# Patient Record
Sex: Female | Born: 1950 | Race: White | Hispanic: No | Marital: Married | Smoking: Former smoker
Health system: Southern US, Community
[De-identification: ages and names within clinical notes are randomized; demographics above are authoritative.]

## PROBLEM LIST (undated history)

## (undated) DIAGNOSIS — F329 Major depressive disorder, single episode, unspecified: Secondary | ICD-10-CM

## (undated) DIAGNOSIS — F419 Anxiety disorder, unspecified: Secondary | ICD-10-CM

## (undated) DIAGNOSIS — R112 Nausea with vomiting, unspecified: Secondary | ICD-10-CM

## (undated) DIAGNOSIS — K449 Diaphragmatic hernia without obstruction or gangrene: Secondary | ICD-10-CM

## (undated) DIAGNOSIS — T4145XA Adverse effect of unspecified anesthetic, initial encounter: Secondary | ICD-10-CM

## (undated) DIAGNOSIS — F32A Depression, unspecified: Secondary | ICD-10-CM

## (undated) DIAGNOSIS — K219 Gastro-esophageal reflux disease without esophagitis: Secondary | ICD-10-CM

## (undated) DIAGNOSIS — N189 Chronic kidney disease, unspecified: Secondary | ICD-10-CM

## (undated) DIAGNOSIS — Z9889 Other specified postprocedural states: Secondary | ICD-10-CM

## (undated) DIAGNOSIS — R569 Unspecified convulsions: Secondary | ICD-10-CM

## (undated) DIAGNOSIS — T8859XA Other complications of anesthesia, initial encounter: Secondary | ICD-10-CM

## (undated) DIAGNOSIS — M1990 Unspecified osteoarthritis, unspecified site: Secondary | ICD-10-CM

## (undated) DIAGNOSIS — I10 Essential (primary) hypertension: Secondary | ICD-10-CM

## (undated) HISTORY — PX: OTHER SURGICAL HISTORY: SHX169

## (undated) HISTORY — PX: BREAST SURGERY: SHX581

## (undated) HISTORY — PX: TUBAL LIGATION: SHX77

## (undated) HISTORY — PX: TONSILLECTOMY: SUR1361

## (undated) HISTORY — PX: ABDOMINAL HYSTERECTOMY: SHX81

## (undated) HISTORY — PX: CHOLECYSTECTOMY: SHX55

---

## 2001-01-25 ENCOUNTER — Ambulatory Visit (HOSPITAL_COMMUNITY): Admission: RE | Admit: 2001-01-25 | Discharge: 2001-01-25 | Payer: Self-pay | Admitting: Internal Medicine

## 2005-05-30 ENCOUNTER — Other Ambulatory Visit: Admission: RE | Admit: 2005-05-30 | Discharge: 2005-05-30 | Payer: Self-pay | Admitting: Obstetrics and Gynecology

## 2006-04-12 ENCOUNTER — Emergency Department (HOSPITAL_COMMUNITY): Admission: EM | Admit: 2006-04-12 | Discharge: 2006-04-12 | Payer: Self-pay | Admitting: Emergency Medicine

## 2007-08-22 ENCOUNTER — Ambulatory Visit (HOSPITAL_COMMUNITY): Admission: RE | Admit: 2007-08-22 | Discharge: 2007-08-22 | Payer: Self-pay | Admitting: Orthopaedic Surgery

## 2007-09-11 ENCOUNTER — Ambulatory Visit (HOSPITAL_COMMUNITY): Admission: RE | Admit: 2007-09-11 | Discharge: 2007-09-11 | Payer: Self-pay | Admitting: Orthopaedic Surgery

## 2007-09-24 ENCOUNTER — Ambulatory Visit (HOSPITAL_COMMUNITY): Admission: RE | Admit: 2007-09-24 | Discharge: 2007-09-24 | Payer: Self-pay | Admitting: Orthopaedic Surgery

## 2008-02-28 ENCOUNTER — Ambulatory Visit (HOSPITAL_COMMUNITY): Admission: RE | Admit: 2008-02-28 | Discharge: 2008-02-28 | Payer: Self-pay | Admitting: General Surgery

## 2010-11-03 ENCOUNTER — Other Ambulatory Visit (HOSPITAL_COMMUNITY): Payer: Self-pay | Admitting: Internal Medicine

## 2010-11-03 DIAGNOSIS — Z139 Encounter for screening, unspecified: Secondary | ICD-10-CM

## 2010-11-04 ENCOUNTER — Ambulatory Visit (HOSPITAL_COMMUNITY): Payer: Self-pay

## 2010-11-05 ENCOUNTER — Ambulatory Visit (HOSPITAL_COMMUNITY)
Admission: RE | Admit: 2010-11-05 | Discharge: 2010-11-05 | Disposition: A | Discharge status: Home / Self Care | Payer: Managed Care, Other (non HMO) | Source: Ambulatory Visit | Attending: Internal Medicine | Admitting: Internal Medicine

## 2010-11-05 DIAGNOSIS — Z1231 Encounter for screening mammogram for malignant neoplasm of breast: Secondary | ICD-10-CM | POA: Insufficient documentation

## 2010-11-05 DIAGNOSIS — Z139 Encounter for screening, unspecified: Secondary | ICD-10-CM

## 2010-11-18 ENCOUNTER — Telehealth: Payer: Self-pay

## 2010-11-18 NOTE — Telephone Encounter (Signed)
Gastroenterology Pre-Procedure Form  Request Date: 11/12/2010,  Requesting Physician: Dr. Dwana Melena     PATIENT INFORMATION:  Kimberly Love is a 60 y.o., female (DOB=February 17, 1951).  PROCEDURE: Procedure(s) requested: colonoscopy Procedure Reason: screening for colon cancer  PATIENT REVIEW QUESTIONS: The patient reports the following:   1. Diabetes Melitis: no 2. Joint replacements in the past 12 months: no 3. Major health problems in the past 3 months: no 4. Has an artificial valve or MVP:no 5. Has been advised in past to take antibiotics in advance of a procedure like teeth cleaning: no}    MEDICATIONS & ALLERGIES:    Patient reports the following regarding taking any blood thinners:   Plavix? no Aspirin?no Coumadin?  no  Patient confirms/reports the following medications:  Current Outpatient Prescriptions  Medication Sig Dispense Refill  . citalopram (CELEXA) 10 MG tablet Take 10 mg by mouth.        Marland Kitchen lisinopril-hydrochlorothiazide (PRINZIDE,ZESTORETIC) 10-12.5 MG per tablet Take 1 tablet by mouth.        Marland Kitchen LORazepam (ATIVAN) 1 MG tablet Take 1 mg by mouth 2 (two) times daily.          Patient confirms/reports the following allergies:  Allergies no known allergies  Patient is appropriate to schedule for requested procedure(s): yes  AUTHORIZATION INFORMATION Primary Insurance: ,  ID #:,  Group #:  Pre-Cert / Auth required:  Pre-Cert / Auth #:   Secondary Insurance: ,  ID #: ,  Group #:  Pre-Cert / Auth required: Pre-Cert / Auth #:   No orders of the defined types were placed in this encounter.    SCHEDULE INFORMATION: Procedure has been scheduled as follows:  Date: , Time:   Location:   This Gastroenterology Pre-Precedure Form is being routed to the following provider(s) for review: R. Arline Asp, MD

## 2010-11-30 ENCOUNTER — Telehealth: Payer: Self-pay

## 2010-11-30 NOTE — Telephone Encounter (Signed)
Called pt to get scheduled for colonoscopy, she said she is not ready at this time. Just had a death in family and she is not dealing with it very well. Said she will call us when she is ready.

## 2010-11-30 NOTE — Telephone Encounter (Signed)
Error

## 2010-12-05 NOTE — Telephone Encounter (Signed)
Ok as is; Reviewed by R. Roetta Sessions, MD [facility]

## 2010-12-05 NOTE — Telephone Encounter (Signed)
Reviewed by R. Michael Keane Martelli, MD FACP FACG 

## 2010-12-08 ENCOUNTER — Other Ambulatory Visit: Payer: Self-pay | Admitting: Internal Medicine

## 2010-12-08 DIAGNOSIS — R928 Other abnormal and inconclusive findings on diagnostic imaging of breast: Secondary | ICD-10-CM

## 2010-12-15 ENCOUNTER — Ambulatory Visit (HOSPITAL_COMMUNITY)
Admission: RE | Admit: 2010-12-15 | Discharge: 2010-12-15 | Disposition: A | Discharge status: Home / Self Care | Payer: Managed Care, Other (non HMO) | Source: Ambulatory Visit | Attending: Internal Medicine | Admitting: Internal Medicine

## 2010-12-15 ENCOUNTER — Other Ambulatory Visit (HOSPITAL_COMMUNITY): Payer: Self-pay | Admitting: Internal Medicine

## 2010-12-15 ENCOUNTER — Other Ambulatory Visit: Payer: Self-pay | Admitting: Radiology

## 2010-12-15 DIAGNOSIS — R928 Other abnormal and inconclusive findings on diagnostic imaging of breast: Secondary | ICD-10-CM

## 2010-12-15 DIAGNOSIS — N63 Unspecified lump in unspecified breast: Secondary | ICD-10-CM | POA: Insufficient documentation

## 2010-12-16 ENCOUNTER — Other Ambulatory Visit: Payer: Self-pay | Admitting: Internal Medicine

## 2010-12-16 DIAGNOSIS — R921 Mammographic calcification found on diagnostic imaging of breast: Secondary | ICD-10-CM

## 2011-01-07 ENCOUNTER — Ambulatory Visit
Admission: RE | Admit: 2011-01-07 | Discharge: 2011-01-07 | Disposition: A | Discharge status: Home / Self Care | Payer: Managed Care, Other (non HMO) | Source: Ambulatory Visit | Attending: Internal Medicine | Admitting: Internal Medicine

## 2011-01-07 ENCOUNTER — Other Ambulatory Visit: Payer: Self-pay | Admitting: Internal Medicine

## 2011-01-07 DIAGNOSIS — R921 Mammographic calcification found on diagnostic imaging of breast: Secondary | ICD-10-CM

## 2011-01-11 NOTE — Op Note (Signed)
Kimberly Love, Kimberly Love           ACCOUNT NO.:  [id number]   MEDICAL RECORD NO.:  [id number]          PATIENT TYPE:  AMB   LOCATION:  DAY                           FACILITY:  [facility]   PHYSICIAN:  J. Darreld Mclean, M.D. DATE OF BIRTH:  07-16-1951   DATE OF PROCEDURE:  09/11/2007  DATE OF DISCHARGE:                               OPERATIVE REPORT   PREOP DIAGNOSIS:  Tear medial meniscus right knee.   POSTOPERATIVE DIAGNOSIS:  Tear medial meniscus right knee.   PROCEDURE:  1. Operative arthroscopy of the right knee.  2. Partial medial meniscectomy.   ANESTHESIA:  General.   SURGEON:  J. Darreld Mclean, M.D.   TOURNIQUET TIME:  29 minutes.   DRAINS:  None.   INDICATIONS:  The patient is a 60 year old female with pain and  tenderness in her right knee giving way.  MRI shows tear of the  posterior horn of the medial meniscus.  This has not improved with  conservative treatment.  Surgery is recommended.  The risks and  imponderables were discussed preoperatively.  The patient asked  appropriate questions, and appears to understand the procedure.   DESCRIPTION OF PROCEDURE:  The patient was seen in the holding area.  The right knee was identified as the correct surgical site.  She placed  a mark on the right knee, and I placed a mark on the right knee.  She  was taken to the operating room and given general anesthesia while  supine.  Leg holder, tourniquet placed, deflated in right upper thigh.  She was prepped and draped in the usual manner.  A time-out identifying  Kimberly Love as the patient, and the right knee as the correct  surgical site.   Appropriately prepped and draped, the leg was elevated, wrapped  circumferentially with an Esmarch bandage..  Tourniquet inflated to 350  mmHg.  Esmarch bandage removed.  Inflow can inserted medially.  Lactated  Ringer's inserted into the knee by an infusion pump.  Arthroscope was  inserted laterally, knee systematically examined.   Suprapatellar pouch  had some mild synovitis; and grade 2 changes of the patella,  particularly medially.  The articular surface of the femur had grade 3  changes as did the tibia with a tear of the posterior horn of the medial  meniscus.  Anterior cruciate was intact laterally with grade 2 changes,  slight fraying of the lateral meniscus but intact.  No loose bodies were  present tissue.   Attention directed to the medial side of the knee, and then using a  meniscal shaver and meniscal punch, a good smooth contour was obtained  for the tear in the posterior horn.  Permanent pictures were taken  throughout the procedure.  The knee was systematically reexamined, and  no new pathology found.   Instruments were removed, and the wound was reapproximated using 3-0  nylon in an interrupted vertical mattress manner.  Marcaine 0.25% was  instilled in each portal.  Tourniquet deflated after 29 minutes.  Sterile dressing applied, bulky dressing applied.  The patient given  prescription for Vicodin ES for pain.  Will be seen in  the office in  approximately 10 days to 2 weeks.  Physical therapy has been arranged.  Any difficulty she is to contact me through the office hospital beeper  system.           ______________________________  Shela Commons. Darreld Mclean, M.D.     JWK/MEDQ  D:  09/11/2007  T:  09/11/2007  Job:  [id number]

## 2011-01-11 NOTE — H&P (Signed)
NAMEHAZELLE, Kimberly Love           ACCOUNT NO.:  [id number]   MEDICAL RECORD NO.:  [id number]          PATIENT TYPE:  AMB   LOCATION:  DAY                           FACILITY:  [facility]   PHYSICIAN:  J. Darreld Mclean, M.D. DATE OF BIRTH:  1951/05/13   DATE OF ADMISSION:  DATE OF DISCHARGE:  LH                              HISTORY & PHYSICAL   CHIEF COMPLAINT:  My knee hurts on the right.   The patient is a 60 year old female with pain and tenderness in her  right knee, began after she fell on Friday December 5.  She had  continued pain, swelling, giving way.  I saw her in the office on  December 16.  I obtained an MRI of the knee on Christmas Eve.  MRI shows  posterior horn medial meniscus tear near the medial meniscus root,  tricompartmental degenerative changes, intact ligamentous changes.  She  had a moderate sized joint effusion.  I informed her the findings on  December 30.  I told her she will most likely need an arthroscopy.   PAST HISTORY:  She had pain and tenderness in right knee on and off in  June 2002.  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  She had taken ibuprofen over the years which helped until the  recent fall.   Past history is negative for heart disease, lung disease, kidney  disease, stroke, paralysis, weakness, hypertension, diabetes,  tuberculosis, rheumatic fever, cancer, polio, ulcer disease or  circulatory problems.   ALLERGIES:  The patient denies any allergies.   MEDICATIONS:  She takes ibuprofen, 4 tablets of Advil 200 mg twice a  day, she takes an occasional Excedrin.   SOCIAL HISTORY:  She smokes a pack a day.  She does not use alcoholic  beverages.   PRIMARY CARE PHYSICIAN:  Dr. Malvin Johns is considered her family doctor.   PAST SURGICAL HISTORY:  She had tubal ligation in 1973, hysterectomy  1985, cholecystectomy 1987.   FAMILY HISTORY:  Hypertension runs in the family.   The patient lives in [place] and is  married.   PHYSICAL EXAMINATION:  VITAL SIGNS:  BP is 120/70, pulse 68,  respirations 16, afebrile, 5 feet 6, 247 pounds.  GENERAL:  She is alert, cooperative, oriented.  HEENT: Negative.  NECK: Supple.  LUNGS: Clear to P&A.  HEART: Regular rate and rhythm without murmur heard.  ABDOMEN: Soft, nontender without masses.  EXTREMITIES:  Right knee is tender.  She has pain over the medial joint  line with a positive McMurray medially.  Knee is stable.  Range of  motion 0 to 110 degrees with pain more medially.  There is a very slight  limp.  CNS: Is intact.  SKIN:  Is intact.   IMPRESSION:  Tear medial meniscus of the right knee, degenerative joint  disease.   PLAN:  Arthroscopy of the knee, partial medial meniscectomy.  I have  explained the risks and imponderables of the procedure to the patient.  She appears to understand and agrees to the procedure as outlined.  She  asked appropriate questions.  Her labs are  pending.                                            ______________________________  J. Darreld Mclean, M.D.     JWK/MEDQ  D:  08/29/2007  T:  08/29/2007  Job:  [id number]

## 2011-01-11 NOTE — H&P (Signed)
Kimberly Love, Kimberly Love           ACCOUNT NO.:  [id number]   MEDICAL RECORD NO.:  [id number]          PATIENT TYPE:  AMB   LOCATION:  DAY                           FACILITY:  [facility]   PHYSICIAN:  J. Darreld Mclean, M.D. DATE OF BIRTH:  [birth year]   DATE OF ADMISSION:  DATE OF DISCHARGE:  LH                              HISTORY & PHYSICAL   CHIEF COMPLAINT:  Right knee pain.   The patient is a 60 year old female who fell on her right knee on Friday  August 03, 2007.  She continued pain and swelling.  I saw her in my  office on August 14, 2007.  I obtained an MRI of the knee.  There was  also concern about a medial meniscal tear.  MRI showed tricompartmental  degenerative joint disease changes, with a posterior horn medial  meniscal tear.  She had a moderate joint effusion.  I informed her of  the findings on August 28, 2007.  She wanted to think about surgery  and talk to her work.  She has talked to them and elects to have surgery  at this time.  I have gone over the risks and imponderables of the  procedure.   PAST HISTORY:  Negative.  She has no allergies.  She has been taking  ibuprofen for the knee as well as Vicodin.  I have changed her to  Naprosyn; she is taking that 1 twice a day.  She smokes a pack of  cigarettes per day.  Does not use alcoholic beverages.   Dr. Malvin Johns is her family doctor.  She is status post tubal ligation in  1973, hysterectomy in 1985, and cholecystectomy in 1987.  Hypertension  runs in the family.  The patient is married, lives in [place].   PHYSICAL EXAMINATION:  GENERAL:  The patient is alert, cooperative, and  oriented.  VITAL SIGNS:  BP is 120/70, pulse 68, respirations 16, afebrile, height  5 feet 6 inches, weight 247 pounds.  NECK:  Supple.  LUNGS: Clear to P&A.  HEART:  Regular rhythm. without murmur heard.  ABDOMEN:  Soft, nontender, without masses.  EXTREMITIES:  Right knee is tender, with positive Murray medially.  She  has a  mild effusion.  Other extremities negative.  CNS: Intact.  SKIN:  Intact.   IMPRESSION:  Tear of medial meniscus right knee.   PLAN:  Operative arthroscopy of the right knee.  Risks and imponderables  have been discussed with the patient preoperatively.  She appears to  understand.  Labs are pending.                                            ______________________________  J. Darreld Mclean, M.D.     JWK/MEDQ  D:  09/10/2007  T:  09/10/2007  Job:  [id number]

## 2011-02-18 ENCOUNTER — Inpatient Hospital Stay: Admission: RE | Admit: 2011-02-18 | Payer: Managed Care, Other (non HMO) | Source: Ambulatory Visit

## 2011-05-19 LAB — COMPREHENSIVE METABOLIC PANEL
AST: 17
Albumin: 4.1
Alkaline Phosphatase: 63
BUN: 13
Calcium: 9.2
Chloride: 103
Creatinine, Ser: 0.54
GFR calc non Af Amer: >60
Total Bilirubin: 0.4
Total Protein: 6.6

## 2011-05-19 LAB — URINALYSIS, ROUTINE W REFLEX MICROSCOPIC
Ketones, ur: NEGATIVE
Urobilinogen, UA: 0.2

## 2011-05-19 LAB — CBC
Hemoglobin: 13.6
MCHC: 32.8
RBC: 4.78
WBC: 12.6 — ABNORMAL HIGH

## 2011-05-19 LAB — DIFFERENTIAL
Basophils Relative: 1
Eosinophils Absolute: 0.6
Monocytes Absolute: 0.7
Neutrophils Relative %: 60

## 2011-12-14 ENCOUNTER — Encounter (HOSPITAL_COMMUNITY): Payer: Self-pay | Admitting: Pharmacy Technician

## 2011-12-15 ENCOUNTER — Other Ambulatory Visit: Payer: Self-pay | Admitting: Orthopedic Surgery

## 2011-12-19 ENCOUNTER — Encounter (HOSPITAL_COMMUNITY): Payer: Self-pay

## 2011-12-19 ENCOUNTER — Encounter (HOSPITAL_COMMUNITY)
Admission: RE | Admit: 2011-12-19 | Discharge: 2011-12-19 | Disposition: A | Discharge status: Home / Self Care | Payer: Managed Care, Other (non HMO) | Source: Ambulatory Visit | Attending: Orthopedic Surgery | Admitting: Orthopedic Surgery

## 2011-12-19 HISTORY — DX: Other specified postprocedural states: R11.2

## 2011-12-19 HISTORY — DX: Unspecified convulsions: R56.9

## 2011-12-19 HISTORY — DX: Other complications of anesthesia, initial encounter: T88.59XA

## 2011-12-19 HISTORY — DX: Anxiety disorder, unspecified: F41.9

## 2011-12-19 HISTORY — DX: Depression, unspecified: F32.A

## 2011-12-19 HISTORY — DX: Major depressive disorder, single episode, unspecified: F32.9

## 2011-12-19 HISTORY — DX: Other specified postprocedural states: Z98.890

## 2011-12-19 HISTORY — DX: Adverse effect of unspecified anesthetic, initial encounter: T41.45XA

## 2011-12-19 HISTORY — DX: Essential (primary) hypertension: I10

## 2011-12-19 LAB — COMPREHENSIVE METABOLIC PANEL
ALT: 11 U/L (ref 0–35)
AST: 14 U/L (ref 0–37)
CO2: 25 mEq/L (ref 19–32)
Chloride: 101 mEq/L (ref 96–112)
Creatinine, Ser: 0.54 mg/dL (ref 0.50–1.10)
GFR calc non Af Amer: >90 mL/min (ref >90)
Sodium: 136 mEq/L (ref 135–145)
Total Bilirubin: 0.3 mg/dL (ref 0.3–1.2)

## 2011-12-19 LAB — CBC
Hemoglobin: 14.8 g/dL (ref 12.0–15.0)
MCV: 87.6 fL (ref 78.0–100.0)
Platelets: 300 10*3/uL (ref 150–400)
RBC: 4.92 MIL/uL (ref 3.87–5.11)
WBC: 11.3 10*3/uL — ABNORMAL HIGH (ref 4.0–10.5)

## 2011-12-19 LAB — URINALYSIS, ROUTINE W REFLEX MICROSCOPIC
Glucose, UA: NEGATIVE mg/dL
Hgb urine dipstick: NEGATIVE
Specific Gravity, Urine: 1.012 (ref 1.005–1.030)

## 2011-12-19 LAB — ABO/RH: ABO/RH(D): A POS

## 2011-12-19 LAB — DIFFERENTIAL
Basophils Absolute: 0 10*3/uL (ref 0.0–0.1)
Basophils Relative: 0 % (ref 0–1)
Eosinophils Relative: 3 % (ref 0–5)
Monocytes Absolute: 0.8 10*3/uL (ref 0.1–1.0)

## 2011-12-19 MED ORDER — CHLORHEXIDINE GLUCONATE 4 % EX LIQD: 60.0000 mL | Freq: Once | CUTANEOUS | Status: DC

## 2011-12-19 NOTE — Pre-Procedure Instructions (Signed)
[name]  12/19/2011   Your procedure is scheduled on:  12/26/11  Report to [facility] at 645 AM.  Call this number if you have problems the morning of surgery: [phone]   Remember:   Do not eat food:After Midnight.  May have clear liquids: up to 4 Hours before arrival.  Clear liquids include soda, tea, black coffee, apple or grape juice, broth.  Take these medicines the morning of surgery with A SIP OF WATER: celexa,,ativan   Do not wear jewelry, make-up or nail polish.  Do not wear lotions, powders, or perfumes. You may wear deodorant.  Do not shave 48 hours prior to surgery.  Do not bring valuables to the hospital.  Contacts, dentures or bridgework may not be worn into surgery.  Leave suitcase in the car. After surgery it may be brought to your room.  For patients admitted to the hospital, checkout time is 11:00 AM the day of discharge.   Patients discharged the day of surgery will not be allowed to drive home.  Name and phone number of your driver: family  Special Instructions: CHG Shower Use Special Wash: 1/2 bottle night before surgery and 1/2 bottle morning of surgery.   Please read over the following fact sheets that you were given: Pain Booklet, Coughing and Deep Breathing, Blood Transfusion Information, MRSA Information and Surgical Site Infection Prevention

## 2011-12-20 LAB — URINE CULTURE
Colony Count: NO GROWTH
Culture: NO GROWTH

## 2011-12-20 NOTE — Consult Note (Addendum)
Anesthesia Chart Review:  Patient is a 61 year old female scheduled for a right TKA on 12/26/11.  History includes HTN, single seizure, depression, anxiety, smoking. PCP is Dr. Dwana Melena.  CXR on 12/19/11 showed no acute cardiopulmonary findings.   Labs acceptable.    EKG on 12/19/11 showed SR with first degree AVB, right BBB.  Currently there are no comparison EKGs available in [facility], and Dr. Scharlene Gloss office is closed.  A fax request was sent.  If a comparison EKG is obtained, I've asked the staff to have me evaluate.  However, since no CV symptoms were documented at her recent PAT visit and she has no documented history of CAD/MI/CHF or DM, then anticipate she will be able to proceed.  Addendum: 12/21/11 1330  There are no prior EKGs done at Dr. Scharlene Gloss office.

## 2011-12-25 MED ORDER — CEFAZOLIN SODIUM-DEXTROSE 2-3 GM-% IV SOLR
2.0000 g | INTRAVENOUS | Status: AC
  Administered 2011-12-26: 2 g via INTRAVENOUS
  Filled 2011-12-25: qty 50

## 2011-12-25 MED ORDER — ACETAMINOPHEN 10 MG/ML IV SOLN
1000.0000 mg | Freq: Four times a day (QID) | INTRAVENOUS | Status: DC
  Administered 2011-12-26: 1000 mg via INTRAVENOUS
  Filled 2011-12-25 (×4): qty 100

## 2011-12-25 MED ORDER — SODIUM CHLORIDE 0.9 % IV SOLN: INTRAVENOUS | Status: DC

## 2011-12-26 ENCOUNTER — Inpatient Hospital Stay (HOSPITAL_COMMUNITY)
Admission: RE | Admit: 2011-12-26 | Discharge: 2011-12-30 | DRG: 469 | Disposition: A | Discharge status: Home Health | Payer: Managed Care, Other (non HMO) | Source: Ambulatory Visit | Attending: Orthopedic Surgery | Admitting: Orthopedic Surgery

## 2011-12-26 ENCOUNTER — Encounter (HOSPITAL_COMMUNITY): Payer: Self-pay | Admitting: *Deleted

## 2011-12-26 ENCOUNTER — Ambulatory Visit (HOSPITAL_COMMUNITY): Payer: Managed Care, Other (non HMO) | Admitting: Vascular Surgery

## 2011-12-26 ENCOUNTER — Encounter (HOSPITAL_COMMUNITY)
Admission: RE | Disposition: A | Discharge status: Home Health | Payer: Self-pay | Source: Ambulatory Visit | Attending: Orthopedic Surgery

## 2011-12-26 ENCOUNTER — Encounter (HOSPITAL_COMMUNITY): Payer: Self-pay | Admitting: Vascular Surgery

## 2011-12-26 DIAGNOSIS — F411 Generalized anxiety disorder: Secondary | ICD-10-CM | POA: Diagnosis present

## 2011-12-26 DIAGNOSIS — J9600 Acute respiratory failure, unspecified whether with hypoxia or hypercapnia: Secondary | ICD-10-CM | POA: Diagnosis not present

## 2011-12-26 DIAGNOSIS — F3289 Other specified depressive episodes: Secondary | ICD-10-CM | POA: Diagnosis present

## 2011-12-26 DIAGNOSIS — E872 Acidosis, unspecified: Secondary | ICD-10-CM | POA: Diagnosis not present

## 2011-12-26 DIAGNOSIS — N179 Acute kidney failure, unspecified: Secondary | ICD-10-CM | POA: Diagnosis not present

## 2011-12-26 DIAGNOSIS — I10 Essential (primary) hypertension: Secondary | ICD-10-CM | POA: Diagnosis present

## 2011-12-26 DIAGNOSIS — D62 Acute posthemorrhagic anemia: Secondary | ICD-10-CM | POA: Diagnosis not present

## 2011-12-26 DIAGNOSIS — F329 Major depressive disorder, single episode, unspecified: Secondary | ICD-10-CM | POA: Diagnosis present

## 2011-12-26 DIAGNOSIS — E861 Hypovolemia: Secondary | ICD-10-CM | POA: Diagnosis not present

## 2011-12-26 DIAGNOSIS — Z9089 Acquired absence of other organs: Secondary | ICD-10-CM

## 2011-12-26 DIAGNOSIS — Z90710 Acquired absence of both cervix and uterus: Secondary | ICD-10-CM

## 2011-12-26 DIAGNOSIS — G9341 Metabolic encephalopathy: Secondary | ICD-10-CM | POA: Diagnosis not present

## 2011-12-26 DIAGNOSIS — R0902 Hypoxemia: Secondary | ICD-10-CM | POA: Diagnosis not present

## 2011-12-26 DIAGNOSIS — D72829 Elevated white blood cell count, unspecified: Secondary | ICD-10-CM | POA: Diagnosis not present

## 2011-12-26 DIAGNOSIS — F17200 Nicotine dependence, unspecified, uncomplicated: Secondary | ICD-10-CM | POA: Diagnosis present

## 2011-12-26 DIAGNOSIS — M1711 Unilateral primary osteoarthritis, right knee: Secondary | ICD-10-CM

## 2011-12-26 DIAGNOSIS — E8729 Other acidosis: Secondary | ICD-10-CM | POA: Diagnosis not present

## 2011-12-26 DIAGNOSIS — J449 Chronic obstructive pulmonary disease, unspecified: Secondary | ICD-10-CM

## 2011-12-26 DIAGNOSIS — R4182 Altered mental status, unspecified: Secondary | ICD-10-CM

## 2011-12-26 DIAGNOSIS — M1710 Unilateral primary osteoarthritis, unspecified knee: Principal | ICD-10-CM | POA: Diagnosis present

## 2011-12-26 HISTORY — PX: TOTAL KNEE ARTHROPLASTY: SHX125

## 2011-12-26 SURGERY — ARTHROPLASTY, KNEE, TOTAL
Anesthesia: General | Site: Knee | Laterality: Right | Wound class: Clean

## 2011-12-26 MED ORDER — LISINOPRIL-HYDROCHLOROTHIAZIDE 20-25 MG PO TABS: 1.0000 | ORAL_TABLET | Freq: Every day | ORAL | Status: DC

## 2011-12-26 MED ORDER — METHOCARBAMOL 100 MG/ML IJ SOLN
500.0000 mg | Freq: Four times a day (QID) | INTRAMUSCULAR | Status: DC | PRN
  Administered 2011-12-26: 500 mg via INTRAVENOUS
  Filled 2011-12-26: qty 5

## 2011-12-26 MED ORDER — HYDROCHLOROTHIAZIDE 25 MG PO TABS
25.0000 mg | ORAL_TABLET | Freq: Every day | ORAL | Status: DC
  Administered 2011-12-27: 25 mg via ORAL
  Filled 2011-12-26 (×3): qty 1

## 2011-12-26 MED ORDER — LISINOPRIL 20 MG PO TABS
20.0000 mg | ORAL_TABLET | Freq: Every day | ORAL | Status: DC
  Administered 2011-12-27: 20 mg via ORAL
  Filled 2011-12-26 (×3): qty 1

## 2011-12-26 MED ORDER — SODIUM CHLORIDE 0.9 % IV SOLN
INTRAVENOUS | Status: DC
  Administered 2011-12-26 – 2011-12-27 (×2): via INTRAVENOUS

## 2011-12-26 MED ORDER — METHOCARBAMOL 500 MG PO TABS
500.0000 mg | ORAL_TABLET | Freq: Four times a day (QID) | ORAL | Status: DC | PRN
  Administered 2011-12-26: 500 mg via ORAL
  Filled 2011-12-26 (×2): qty 1

## 2011-12-26 MED ORDER — MENTHOL 3 MG MT LOZG: 1.0000 | LOZENGE | OROMUCOSAL | Status: DC | PRN

## 2011-12-26 MED ORDER — METOCLOPRAMIDE HCL 10 MG PO TABS: 5.0000 mg | ORAL_TABLET | Freq: Three times a day (TID) | ORAL | Status: DC | PRN

## 2011-12-26 MED ORDER — ACETAMINOPHEN 650 MG RE SUPP: 650.0000 mg | Freq: Four times a day (QID) | RECTAL | Status: DC | PRN

## 2011-12-26 MED ORDER — LACTATED RINGERS IV SOLN
INTRAVENOUS | Status: DC | PRN
  Administered 2011-12-26 (×2): via INTRAVENOUS

## 2011-12-26 MED ORDER — OXYCODONE HCL 5 MG PO TABS
5.0000 mg | ORAL_TABLET | ORAL | Status: DC | PRN
  Administered 2011-12-26: 10 mg via ORAL
  Administered 2011-12-26: 5 mg via ORAL
  Administered 2011-12-26 – 2011-12-27 (×2): 10 mg via ORAL
  Filled 2011-12-26 (×3): qty 2

## 2011-12-26 MED ORDER — LORAZEPAM 2 MG/ML IJ SOLN
1.0000 mg | Freq: Once | INTRAMUSCULAR | Status: AC | PRN
  Administered 2011-12-26: 0.5 mg via INTRAVENOUS

## 2011-12-26 MED ORDER — SENNOSIDES-DOCUSATE SODIUM 8.6-50 MG PO TABS
1.0000 | ORAL_TABLET | Freq: Every evening | ORAL | Status: DC | PRN
  Filled 2011-12-26: qty 1

## 2011-12-26 MED ORDER — MIDAZOLAM HCL 2 MG/2ML IJ SOLN
1.0000 mg | INTRAMUSCULAR | Status: DC | PRN
  Administered 2011-12-26: 2 mg via INTRAVENOUS

## 2011-12-26 MED ORDER — ONDANSETRON HCL 4 MG/2ML IJ SOLN
INTRAMUSCULAR | Status: DC | PRN
  Administered 2011-12-26 (×2): 4 mg via INTRAVENOUS

## 2011-12-26 MED ORDER — DOCUSATE SODIUM 100 MG PO CAPS
100.0000 mg | ORAL_CAPSULE | Freq: Two times a day (BID) | ORAL | Status: DC
  Administered 2011-12-26 – 2011-12-30 (×7): 100 mg via ORAL
  Filled 2011-12-26 (×9): qty 1

## 2011-12-26 MED ORDER — METOCLOPRAMIDE HCL 5 MG/ML IJ SOLN
5.0000 mg | Freq: Three times a day (TID) | INTRAMUSCULAR | Status: DC | PRN
  Filled 2011-12-26: qty 2

## 2011-12-26 MED ORDER — SCOPOLAMINE 1 MG/3DAYS TD PT72
MEDICATED_PATCH | TRANSDERMAL | Status: DC | PRN
  Administered 2011-12-26: 1 via TRANSDERMAL

## 2011-12-26 MED ORDER — ACETAMINOPHEN 325 MG PO TABS
650.0000 mg | ORAL_TABLET | Freq: Four times a day (QID) | ORAL | Status: DC | PRN
  Administered 2011-12-29: 650 mg via ORAL
  Filled 2011-12-26: qty 2

## 2011-12-26 MED ORDER — FENTANYL CITRATE 0.05 MG/ML IJ SOLN
50.0000 ug | INTRAMUSCULAR | Status: DC | PRN
  Administered 2011-12-26: 100 ug via INTRAVENOUS

## 2011-12-26 MED ORDER — BUPIVACAINE 0.25 % ON-Q PUMP SINGLE CATH 300ML
300.0000 mL | INJECTION | Status: DC
  Filled 2011-12-26: qty 300

## 2011-12-26 MED ORDER — BUPIVACAINE-EPINEPHRINE PF 0.5-1:200000 % IJ SOLN
INTRAMUSCULAR | Status: DC | PRN
  Administered 2011-12-26: 25 mL

## 2011-12-26 MED ORDER — ROCURONIUM BROMIDE 100 MG/10ML IV SOLN
INTRAVENOUS | Status: DC | PRN
  Administered 2011-12-26: 50 mg via INTRAVENOUS

## 2011-12-26 MED ORDER — ZOLPIDEM TARTRATE 5 MG PO TABS: 5.0000 mg | ORAL_TABLET | Freq: Every evening | ORAL | Status: DC | PRN

## 2011-12-26 MED ORDER — FENTANYL CITRATE 0.05 MG/ML IJ SOLN
INTRAMUSCULAR | Status: DC | PRN
Start: 2011-12-26 — End: 2011-12-26
  Administered 2011-12-26: 100 ug via INTRAVENOUS
  Administered 2011-12-26 (×3): 50 ug via INTRAVENOUS

## 2011-12-26 MED ORDER — ENOXAPARIN SODIUM 30 MG/0.3ML ~~LOC~~ SOLN
30.0000 mg | Freq: Two times a day (BID) | SUBCUTANEOUS | Status: DC
  Administered 2011-12-27 – 2011-12-30 (×6): 30 mg via SUBCUTANEOUS
  Filled 2011-12-26 (×10): qty 0.3

## 2011-12-26 MED ORDER — LORAZEPAM 1 MG PO TABS
1.0000 mg | ORAL_TABLET | Freq: Every day | ORAL | Status: DC
  Administered 2011-12-26: 1 mg via ORAL
  Filled 2011-12-26: qty 1

## 2011-12-26 MED ORDER — MIDAZOLAM HCL 2 MG/2ML IJ SOLN
INTRAMUSCULAR | Status: AC
  Filled 2011-12-26: qty 2

## 2011-12-26 MED ORDER — BISACODYL 5 MG PO TBEC: 5.0000 mg | DELAYED_RELEASE_TABLET | Freq: Every day | ORAL | Status: DC | PRN

## 2011-12-26 MED ORDER — HYDROMORPHONE HCL PF 1 MG/ML IJ SOLN
0.5000 mg | INTRAMUSCULAR | Status: DC | PRN
  Administered 2011-12-26 – 2011-12-27 (×2): 1 mg via INTRAVENOUS
  Filled 2011-12-26 (×2): qty 1

## 2011-12-26 MED ORDER — CITALOPRAM HYDROBROMIDE 20 MG PO TABS
20.0000 mg | ORAL_TABLET | Freq: Every day | ORAL | Status: DC
  Administered 2011-12-26 – 2011-12-30 (×5): 20 mg via ORAL
  Filled 2011-12-26 (×5): qty 1

## 2011-12-26 MED ORDER — BUPIVACAINE 0.25 % ON-Q PUMP SINGLE CATH 300ML
INJECTION | Status: DC | PRN
  Administered 2011-12-26: 300 mL

## 2011-12-26 MED ORDER — PHENOL 1.4 % MT LIQD: 1.0000 | OROMUCOSAL | Status: DC | PRN

## 2011-12-26 MED ORDER — ONDANSETRON HCL 4 MG/2ML IJ SOLN
4.0000 mg | Freq: Four times a day (QID) | INTRAMUSCULAR | Status: DC | PRN
  Administered 2011-12-27: 4 mg via INTRAVENOUS
  Filled 2011-12-26: qty 2

## 2011-12-26 MED ORDER — OXYCODONE HCL 5 MG PO TABS
ORAL_TABLET | ORAL | Status: AC
  Filled 2011-12-26: qty 2

## 2011-12-26 MED ORDER — FLEET ENEMA 7-19 GM/118ML RE ENEM: 1.0000 | ENEMA | Freq: Once | RECTAL | Status: AC | PRN

## 2011-12-26 MED ORDER — LORAZEPAM 2 MG/ML IJ SOLN
INTRAMUSCULAR | Status: AC
  Filled 2011-12-26: qty 1

## 2011-12-26 MED ORDER — MIDAZOLAM HCL 5 MG/5ML IJ SOLN
INTRAMUSCULAR | Status: DC | PRN
  Administered 2011-12-26: 1 mg via INTRAVENOUS

## 2011-12-26 MED ORDER — PROPOFOL 10 MG/ML IV EMUL
INTRAVENOUS | Status: DC | PRN
  Administered 2011-12-26: 200 mg via INTRAVENOUS

## 2011-12-26 MED ORDER — ACETAMINOPHEN 10 MG/ML IV SOLN
INTRAVENOUS | Status: AC
  Filled 2011-12-26: qty 100

## 2011-12-26 MED ORDER — HYDROMORPHONE HCL PF 1 MG/ML IJ SOLN
0.2500 mg | INTRAMUSCULAR | Status: DC | PRN
  Administered 2011-12-26 (×2): 0.5 mg via INTRAVENOUS

## 2011-12-26 MED ORDER — ONDANSETRON HCL 4 MG PO TABS: 4.0000 mg | ORAL_TABLET | Freq: Four times a day (QID) | ORAL | Status: DC | PRN

## 2011-12-26 MED ORDER — BUPIVACAINE ON-Q PAIN PUMP (FOR ORDER SET NO CHG)
INJECTION | Status: AC
  Filled 2011-12-26: qty 1

## 2011-12-26 MED ORDER — DIPHENHYDRAMINE HCL 12.5 MG/5ML PO ELIX
12.5000 mg | ORAL_SOLUTION | ORAL | Status: DC | PRN
  Filled 2011-12-26: qty 10

## 2011-12-26 MED ORDER — LACTATED RINGERS IV SOLN
INTRAVENOUS | Status: DC
  Administered 2011-12-26: 09:00:00 via INTRAVENOUS

## 2011-12-26 MED ORDER — CEFAZOLIN SODIUM-DEXTROSE 2-3 GM-% IV SOLR
2.0000 g | Freq: Four times a day (QID) | INTRAVENOUS | Status: AC
  Administered 2011-12-26 – 2011-12-27 (×3): 2 g via INTRAVENOUS
  Filled 2011-12-26 (×4): qty 50

## 2011-12-26 MED ORDER — BUPIVACAINE-EPINEPHRINE 0.25% -1:200000 IJ SOLN
INTRAMUSCULAR | Status: DC | PRN
  Administered 2011-12-26: 20 mL

## 2011-12-26 MED ORDER — NEOSTIGMINE METHYLSULFATE 1 MG/ML IJ SOLN
INTRAMUSCULAR | Status: DC | PRN
  Administered 2011-12-26: 3 mg via INTRAVENOUS

## 2011-12-26 MED ORDER — FENTANYL CITRATE 0.05 MG/ML IJ SOLN
INTRAMUSCULAR | Status: AC
  Filled 2011-12-26: qty 2

## 2011-12-26 MED ORDER — SODIUM CHLORIDE 0.9 % IR SOLN
Status: DC | PRN
  Administered 2011-12-26: 1000 mL

## 2011-12-26 MED ORDER — OXYCODONE HCL 10 MG PO TB12
10.0000 mg | ORAL_TABLET | Freq: Two times a day (BID) | ORAL | Status: DC
  Administered 2011-12-26: 10 mg via ORAL
  Filled 2011-12-26: qty 1

## 2011-12-26 MED ORDER — LIDOCAINE HCL (CARDIAC) 20 MG/ML IV SOLN
INTRAVENOUS | Status: DC | PRN
  Administered 2011-12-26: 100 mg via INTRAVENOUS

## 2011-12-26 MED ORDER — CELECOXIB 200 MG PO CAPS
200.0000 mg | ORAL_CAPSULE | Freq: Two times a day (BID) | ORAL | Status: DC
  Administered 2011-12-26 – 2011-12-30 (×7): 200 mg via ORAL
  Filled 2011-12-26 (×9): qty 1

## 2011-12-26 MED ORDER — SCOPOLAMINE 1 MG/3DAYS TD PT72
1.0000 | MEDICATED_PATCH | Freq: Once | TRANSDERMAL | Status: DC
  Filled 2011-12-26: qty 1

## 2011-12-26 MED ORDER — GLYCOPYRROLATE 0.2 MG/ML IJ SOLN
INTRAMUSCULAR | Status: DC | PRN
  Administered 2011-12-26: 0.6 mg via INTRAVENOUS

## 2011-12-26 MED ORDER — ALUM & MAG HYDROXIDE-SIMETH 200-200-20 MG/5ML PO SUSP
30.0000 mL | ORAL | Status: DC | PRN
  Filled 2011-12-26: qty 30

## 2011-12-26 SURGICAL SUPPLY — 58 items
BANDAGE ESMARK 6X9 LF (GAUZE/BANDAGES/DRESSINGS) ×1 IMPLANT
BLADE SAGITTAL 13X1.27X60 (BLADE) ×2 IMPLANT
BLADE SAW SGTL 83.5X18.5 (BLADE) ×2 IMPLANT
BNDG ESMARK 6X9 LF (GAUZE/BANDAGES/DRESSINGS) ×2
BOWL SMART MIX CTS (DISPOSABLE) ×2 IMPLANT
CATH KIT ON Q 5IN SLV (PAIN MANAGEMENT) ×2 IMPLANT
CEMENT BONE SIMPLEX SPEEDSET (Cement) ×4 IMPLANT
CLOTH BEACON ORANGE TIMEOUT ST (SAFETY) ×2 IMPLANT
COVER BACK TABLE 24X17X13 BIG (DRAPES) ×2 IMPLANT
COVER SURGICAL LIGHT HANDLE (MISCELLANEOUS) ×2 IMPLANT
CUFF TOURNIQUET SINGLE 34IN LL (TOURNIQUET CUFF) IMPLANT
CUFF TOURNIQUET SINGLE 44IN (TOURNIQUET CUFF) ×2 IMPLANT
DRAPE EXTREMITY T 121X128X90 (DRAPE) ×2 IMPLANT
DRAPE INCISE IOBAN 66X45 STRL (DRAPES) ×4 IMPLANT
DRAPE PROXIMA HALF (DRAPES) ×2 IMPLANT
DRAPE U-SHAPE 47X51 STRL (DRAPES) ×2 IMPLANT
DRSG ADAPTIC 3X8 NADH LF (GAUZE/BANDAGES/DRESSINGS) ×2 IMPLANT
DRSG PAD ABDOMINAL 8X10 ST (GAUZE/BANDAGES/DRESSINGS) ×2 IMPLANT
DURAPREP 26ML APPLICATOR (WOUND CARE) ×2 IMPLANT
ELECT REM PT RETURN 9FT ADLT (ELECTROSURGICAL) ×2
ELECTRODE REM PT RTRN 9FT ADLT (ELECTROSURGICAL) ×1 IMPLANT
EVACUATOR 1/8 PVC DRAIN (DRAIN) ×2 IMPLANT
GLOVE BIOGEL M 7.0 STRL (GLOVE) ×2 IMPLANT
GLOVE BIOGEL PI IND STRL 7.5 (GLOVE) ×1 IMPLANT
GLOVE BIOGEL PI IND STRL 8.5 (GLOVE) ×2 IMPLANT
GLOVE BIOGEL PI INDICATOR 7.5 (GLOVE) ×1
GLOVE BIOGEL PI INDICATOR 8.5 (GLOVE) ×2
GLOVE SURG ORTHO 8.0 STRL STRW (GLOVE) ×4 IMPLANT
GOWN PREVENTION PLUS XLARGE (GOWN DISPOSABLE) ×4 IMPLANT
GOWN STRL NON-REIN LRG LVL3 (GOWN DISPOSABLE) ×4 IMPLANT
HANDPIECE INTERPULSE COAX TIP (DISPOSABLE) ×1
HOOD PEEL AWAY FACE SHEILD DIS (HOOD) ×8 IMPLANT
KIT BASIN OR (CUSTOM PROCEDURE TRAY) ×2 IMPLANT
KIT ROOM TURNOVER OR (KITS) ×2 IMPLANT
MANIFOLD NEPTUNE II (INSTRUMENTS) ×2 IMPLANT
NEEDLE 22X1 1/2 (OR ONLY) (NEEDLE) IMPLANT
NS IRRIG 1000ML POUR BTL (IV SOLUTION) ×2 IMPLANT
PACK TOTAL JOINT (CUSTOM PROCEDURE TRAY) ×2 IMPLANT
PAD ARMBOARD 7.5X6 YLW CONV (MISCELLANEOUS) ×4 IMPLANT
PADDING CAST COTTON 6X4 STRL (CAST SUPPLIES) ×2 IMPLANT
POSITIONER HEAD PRONE TRACH (MISCELLANEOUS) ×2 IMPLANT
SET HNDPC FAN SPRY TIP SCT (DISPOSABLE) ×1 IMPLANT
SPONGE GAUZE 4X4 12PLY (GAUZE/BANDAGES/DRESSINGS) ×2 IMPLANT
STAPLER VISISTAT 35W (STAPLE) ×2 IMPLANT
SUCTION FRAZIER TIP 10 FR DISP (SUCTIONS) ×2 IMPLANT
SUT BONE WAX W31G (SUTURE) ×2 IMPLANT
SUT VIC AB 0 CTB1 27 (SUTURE) ×4 IMPLANT
SUT VIC AB 1 CT1 27 (SUTURE) ×1
SUT VIC AB 1 CT1 27XBRD ANBCTR (SUTURE) ×1 IMPLANT
SUT VIC AB 2-0 CT1 27 (SUTURE) ×2
SUT VIC AB 2-0 CT1 TAPERPNT 27 (SUTURE) ×2 IMPLANT
SUT VLOC 180 0 24IN GS25 (SUTURE) ×2 IMPLANT
SYR CONTROL 10ML LL (SYRINGE) IMPLANT
TOWEL OR 17X24 6PK STRL BLUE (TOWEL DISPOSABLE) ×2 IMPLANT
TOWEL OR 17X26 10 PK STRL BLUE (TOWEL DISPOSABLE) ×2 IMPLANT
TRAY FOLEY CATH 14FR (SET/KITS/TRAYS/PACK) ×2 IMPLANT
WATER STERILE IRR 1000ML POUR (IV SOLUTION) ×6 IMPLANT
YANKAUER SUCT BULB TIP NO VENT (SUCTIONS) ×2 IMPLANT

## 2011-12-26 NOTE — Anesthesia Preprocedure Evaluation (Addendum)
Anesthesia Evaluation  Patient identified by MRN, date of birth, ID band Patient awake    Reviewed: Allergy & Precautions, H&P , NPO status , Patient's Chart, lab work & pertinent test results  History of Anesthesia Complications (+) PONV  Airway Mallampati: II TM Distance: >3 FB Neck ROM: Full    Dental   Pulmonary    Pulmonary exam normal       Cardiovascular hypertension,     Neuro/Psych Seizures -,  Anxiety Depression    GI/Hepatic   Endo/Other  Morbid obesity  Renal/GU      Musculoskeletal   Abdominal (+) + obese,   Peds  Hematology   Anesthesia Other Findings   Reproductive/Obstetrics                           Anesthesia Physical Anesthesia Plan  ASA: III  Anesthesia Plan: General   Post-op Pain Management:    Induction: Intravenous  Airway Management Planned: Oral ETT  Additional Equipment:   Intra-op Plan:   Post-operative Plan: Extubation in OR  Informed Consent: I have reviewed the patients History and Physical, chart, labs and discussed the procedure including the risks, benefits and alternatives for the proposed anesthesia with the patient or authorized representative who has indicated his/her understanding and acceptance.     Plan Discussed with: CRNA and Surgeon  Anesthesia Plan Comments:         Anesthesia Quick Evaluation

## 2011-12-26 NOTE — Anesthesia Postprocedure Evaluation (Signed)
  Anesthesia Post-op Note  Patient: Kimberly Love  Procedure(s) Performed: Procedure(s) (LRB): TOTAL KNEE ARTHROPLASTY (Right)  Patient Location: PACU  Anesthesia Type: GA combined with regional for post-op pain  Level of Consciousness: awake  Airway and Oxygen Therapy: Patient Spontanous Breathing  Post-op Pain: mild  Post-op Assessment: Post-op Vital signs reviewed, Patient's Cardiovascular Status Stable, Respiratory Function Stable, Patent Airway, No signs of Nausea or vomiting and Pain level controlled  Post-op Vital Signs: stable  Complications: No apparent anesthesia complications

## 2011-12-26 NOTE — Preoperative (Signed)
Beta Blockers   Reason not to administer Beta Blockers:Not Applicable 

## 2011-12-26 NOTE — Transfer of Care (Signed)
Immediate Anesthesia Transfer of Care Note  Patient: Kimberly Love  Procedure(s) Performed: Procedure(s) (LRB): TOTAL KNEE ARTHROPLASTY (Right)  Patient Location: PACU  Anesthesia Type: General  Level of Consciousness: sedated  Airway & Oxygen Therapy: Patient Spontanous Breathing and Patient connected to face mask  Post-op Assessment: Report given to PACU RN, Post -op Vital signs reviewed and stable and Patient moving all extremities X 4  Post vital signs: Reviewed and stable  Complications: No apparent anesthesia complications

## 2011-12-26 NOTE — H&P (Signed)
Kimberly Love MRN:  [id number] DOB/SEX:  [birth year]/female  CHIEF COMPLAINT:  Painful right Knee  HISTORY: Patient is a 61 y.o. female presented with a history of pain in the right knee. Onset of symptoms was gradual starting several years ago with gradually worsening course since that time. The patient noted no past surgery on the right knee. Prior procedures on the knee include arthroscopy. Patient has been treated conservatively with over-the-counter NSAIDs and activity modification. Patient currently rates pain in the knee at several out of 10 with activity. There is pain at night.  PAST MEDICAL HISTORY: There are no active problems to display for this patient.  Past Medical History  Diagnosis Date  . Complication of anesthesia     has seizure in 1973  while waking up  . Seizures     x1  . Anxiety   . Depression   . Hypertension     dr Timothy Lasso   [facility]  [place]  . PONV (postoperative nausea and vomiting)    Past Surgical History  Procedure Date  . Tonsillectomy   . Cholecystectomy   . Breast surgery     bio  2012   neg  . Abdominal hysterectomy   . Orthorscopy     rt knee     MEDICATIONS:   Prescriptions prior to admission  Medication Sig Dispense Refill  . citalopram (CELEXA) 20 MG tablet Take 20 mg by mouth daily.      Marland Kitchen ibuprofen (ADVIL,MOTRIN) 200 MG tablet Take 600-800 mg by mouth every 6 (six) hours as needed. For pain      . lisinopril-hydrochlorothiazide (PRINZIDE,ZESTORETIC) 20-25 MG per tablet Take 1 tablet by mouth daily.      Marland Kitchen LORazepam (ATIVAN) 1 MG tablet Take 1-3 mg by mouth at bedtime. Depends on pain        ALLERGIES:  No Known Allergies  REVIEW OF SYSTEMS:  Pertinent items are noted in HPI.   FAMILY HISTORY:  History reviewed. No pertinent family history.  SOCIAL HISTORY:   History  Substance Use Topics  . Smoking status: Current Everyday Smoker -- 0.2 packs/day for 32 years    Types: Cigarettes  . Smokeless tobacco: Not on file  .  Alcohol Use: Yes     not in x 5 yrs     EXAMINATION:  Vital signs in last 24 hours: Temp:  [98.1 F (36.7 C)] 98.1 F (36.7 C) (04/29 0645) Pulse Rate:  [70] 70  (04/29 0645) Resp:  [18] 18  (04/29 0645) BP: (134)/(72) 134/72 mmHg (04/29 0645) SpO2:  [94 %] 94 % (04/29 0645)  General appearance: alert, cooperative and no distress Lungs: clear to auscultation bilaterally Heart: regular rate and rhythm, S1, S2 normal, no murmur, click, rub or gallop Abdomen: soft, non-tender; bowel sounds normal; no masses,  no organomegaly Extremities: extremities normal, atraumatic, no cyanosis or edema and Homans sign is negative, no sign of DVT Pulses: 2+ and symmetric Skin: Skin color, texture, turgor normal. No rashes or lesions Neurologic: Alert and oriented X 3, normal strength and tone. Normal symmetric reflexes. Normal coordination and gait  Musculoskeletal:  ROM 0-110, Ligaments intact,  Imaging Review Plain radiographs demonstrate severe degenerative joint disease of the right knee. The overall alignment is mild valgus. The bone quality appears to be good for age and reported activity level.  Assessment/Plan: End stage arthritis, right knee   The patient history, physical examination and imaging studies are consistent with advanced degenerative joint disease of the right knee.  The patient has failed conservative treatment.  The clearance notes were reviewed.  After discussion with the patient it was felt that Total Knee Replacement was indicated. The procedure,  risks, and benefits of total knee arthroplasty were presented and reviewed. The risks including but not limited to aseptic loosening, infection, blood clots, vascular injury, stiffness, patella tracking problems complications among others were discussed. The patient acknowledged the explanation, agreed to proceed with the plan.  Dashel Goines 12/26/2011, 7:13 AM

## 2011-12-26 NOTE — Progress Notes (Signed)
Orthopedic Tech Progress Note Patient Details:  Kimberly Love [birth year] [id number]  CPM Right Knee CPM Right Knee: On Right Knee Flexion (Degrees): 90  Right Knee Extension (Degrees): 0  Additional Comments: trapeze bar   Cammer, Mickie Bail 12/26/2011, 2:34 PM

## 2011-12-26 NOTE — Anesthesia Procedure Notes (Addendum)
Anesthesia Regional Block:  Femoral nerve block  Pre-Anesthetic Checklist: ,, timeout performed, Correct Patient, Correct Site, Correct Laterality, Correct Procedure, Correct Position, site marked, Risks and benefits discussed,  Surgical consent,  Pre-op evaluation,  At surgeon's request and post-op pain management  Laterality: Right     Needles:  Injection technique: Single-shot  Needle Type: Stimulator Needle - 80     Needle Length: 8cm  Needle Gauge: 22 and 22 G    Additional Needles:  Procedures: nerve stimulator Femoral nerve block  Nerve Stimulator or Paresthesia:  Response: 0.48 mA,   Additional Responses:   Narrative:  Start time: 12/26/2011 8:30 AM End time: 12/26/2011 8:41 AM Injection made incrementally with aspirations every 5 mL. Anesthesiologist: Dr Gypsy Balsam  Additional Notes: [id number] R Fem N Block POP Sterile tech, CHG prep #22 stim needle w/ stim down to .48ma Multiple neg asp Marc .5% w/1:200000 epi total 25cc No compl Dr Gypsy Balsam   Procedure Name: Intubation Date/Time: 12/26/2011 9:01 AM Performed by: Sharlene Dory E Pre-anesthesia Checklist: Patient identified, Emergency Drugs available, Suction available, Patient being monitored and Timeout performed Patient Re-evaluated:Patient Re-evaluated prior to inductionOxygen Delivery Method: Circle system utilized Preoxygenation: Pre-oxygenation with 100% oxygen Intubation Type: IV induction Ventilation: Mask ventilation without difficulty Laryngoscope Size: Mac and 3 Grade View: Grade IV Tube size: 7.5 mm Number of attempts: 2 Airway Equipment and Method: Stylet and Video-laryngoscopy Placement Confirmation: ETT inserted through vocal cords under direct vision,  positive ETCO2 and breath sounds checked- equal and bilateral Secured at: 21 cm Tube secured with: Tape Dental Injury: Teeth and Oropharynx as per pre-operative assessment  Comments: DL x1 with MAC3, grade III view. DLx1 with glidescope, grade  1 view. AOI with 7.5 ETT. +ETCO2 & BBS=.

## 2011-12-27 ENCOUNTER — Encounter (HOSPITAL_COMMUNITY): Payer: Self-pay | Admitting: Orthopedic Surgery

## 2011-12-27 LAB — CBC
Hemoglobin: 12.4 g/dL (ref 12.0–15.0)
Platelets: 282 10*3/uL (ref 150–400)
RBC: 4.25 MIL/uL (ref 3.87–5.11)
WBC: 23.6 10*3/uL — ABNORMAL HIGH (ref 4.0–10.5)

## 2011-12-27 LAB — BASIC METABOLIC PANEL
CO2: 28 mEq/L (ref 19–32)
Glucose, Bld: 144 mg/dL — ABNORMAL HIGH (ref 70–99)
Potassium: 4.5 mEq/L (ref 3.5–5.1)
Sodium: 137 mEq/L (ref 135–145)

## 2011-12-27 MED ORDER — SODIUM CHLORIDE 0.9 % IV SOLN
INTRAVENOUS | Status: DC
  Administered 2011-12-27: 18:00:00 via INTRAVENOUS
  Administered 2011-12-27: 500 mL via INTRAVENOUS
  Administered 2011-12-28: 11:00:00 via INTRAVENOUS

## 2011-12-27 MED ORDER — HYDROCODONE-ACETAMINOPHEN 5-325 MG PO TABS: 1.0000 | ORAL_TABLET | ORAL | Status: DC | PRN

## 2011-12-27 NOTE — Progress Notes (Signed)
UR COMPLETED  

## 2011-12-27 NOTE — Progress Notes (Signed)
12/27/11 1640  PT Visit Information  Last PT Received On 12/27/11  Assistance Needed +2  PT Time Calculation  PT Start Time 1618  PT Stop Time 1639  PT Time Calculation (min) 21 min  Subjective Data  Subjective I'm going to refuse to do anymore now  Patient Stated Goal Independent  Precautions  Precautions Knee  Precaution Booklet Issued No  Restrictions  Weight Bearing Restrictions Yes  RLE Weight Bearing WBAT  Cognition  Overall Cognitive Status Appears within functional limits for tasks assessed/performed  Arousal/Alertness Lethargic  Orientation Level Oriented X4 / Intact  Exercises  Exercises Total Joint  Total Joint Exercises  Ankle Circles/Pumps AROM;20 reps;Both;Supine  Heel Slides AAROM;Right;10 reps;Supine  Quad Sets AAROM;10 reps;Both;Supine  Short Arc Quad AROM;Right;10 reps;Supine  Hip ABduction/ADduction AAROM;20 reps;Right;Supine  Straight Leg Raises AAROM;Right;10 reps;Supine  PT - End of Session  Activity Tolerance Patient tolerated treatment well;Patient limited by pain  Patient left in bed;with call bell/phone within reach;with family/visitor present  Nurse Communication Mobility status  PT - Assessment/Plan  Comments on Treatment Session Treatment limited by lethargy and pain, though pt did participate in 2 sessions and did ambulate get oob and do most of the exercises  PT Plan Discharge plan remains appropriate  PT Frequency 7X/week  Recommendations for Other Services OT consult  Follow Up Recommendations Home health PT  Equipment Recommended None recommended by PT  Acute Rehab PT Goals  PT Goal Formulation With patient  Time For Goal Achievement 01/03/12  Potential to Achieve Goals Good  Pt will go Supine/Side to Sit with supervision  PT Goal: Supine/Side to Sit - Progress Progressing toward goal  PT Goal: Perform Home Exercise Program - Progress Progressing toward goal   12/27/2011  [place] Bing, PT [phone] (pager)

## 2011-12-27 NOTE — Op Note (Signed)
TOTAL KNEE REPLACEMENT OPERATIVE NOTE:  12/26/2011  10:55 AM  PATIENT:  Kimberly Love  61 y.o. female  PRE-OPERATIVE DIAGNOSIS:  OSTEOARTHRITIS RIGHT SIDE  POST-OPERATIVE DIAGNOSIS:  OSTEOARTHRITIS RIGHT SIDE  PROCEDURE:  Procedure(s): TOTAL KNEE ARTHROPLASTY  SURGEON:  Surgeon(s): Raymon Mutton, MD  PHYSICIAN ASSISTANT: Altamese Cabal, [facility]  ANESTHESIA:   general  DRAINS: Hemovac and On-Q Marcaine Pain Pump  SPECIMEN: None  COUNTS:  Correct  TOURNIQUET:   Total Tourniquet Time Documented: Thigh (Right) - 50 minutes  DICTATION:  Indication for procedure:    The patient is a 61 y.o. female who has failed conservative treatment for OSTEOARTHRITIS RIGHT SIDE.  Informed consent was obtained prior to anesthesia. The risks versus benefits of the operation were explain and in a way the patient can, and did, understand.   Description of procedure:     The patient was taken to the operating room and placed under anesthesia.  The patient was positioned in the usual fashion taking care that all body parts were adequately padded and/or protected.  I foley catheter was placed.  A tourniquet was applied and the leg prepped and draped in the usual sterile fashion.  The extremity was exsanguinated with the esmarch and tourniquet inflated to 350 mmHg.  Pre-operative range of motion was normal.  The knee was in 5 degree of neutral.  A midline incision approximately 6-7 inches long was made with a #10 blade.  A new blade was used to make a parapatellar arthrotomy going 2-3 cm into the quadriceps tendon, over the patella, and alongside the medial aspect of the patellar tendon.  A synovectomy was then performed with the #10 blade and forceps. I then elevated the deep MCL off the medial tibial metaphysis subperiosteally around to the semimembranosus attachment.    I everted the patella and used calipers to measure patellar thickness.  I used the reamer to ream down to appropriate thickness  to recreate the native thickness.  I then removed excess bone with the rongeur and sagittal saw.  I used the appropriately sized template and drilled the three lug holes.  I then put the trial in place and measured the thickness with the calipers to ensure recreation of the native thickness.  The trial was then removed and the patella subluxed and the knee brought into flexion.  A homan retractor was place to retract and protect the patella and lateral structures.  A Z-retractor was place medially to protect the medial structures.  The extra-medullary alignment system was used to make cut the tibial articular surface perpendicular to the anamotic axis of the tibia and in 3 degrees of posterior slope.  The cut surface and alignment jig was removed.  I then used the intramedullary alignment guide to make a 6 valgus cut on the distal femur.  I then marked out the epicondylar axis on the distal femur.  The posterior condylar axis measured 3 degrees.  I then used the anterior referencing sizer and measured the femur to be a size E.  The 4-In-1 cutting block was screwed into place in external rotation matching the posterior condylar angle, making our cuts perpendicular to the epicondylar axis.  Anterior, posterior and chamfer cuts were made with the sagittal saw.  The cutting block and cut pieces were removed.  A lamina spreader was placed in 90 degrees of flexion.  The ACL, PCL, menisci, and posterior condylar osteophytes were removed.  A 10 mm spacer blocked was found to offer good flexion and  extension gap balance after moderate in degree releasing.   The scoop retractor was then placed and the femoral finishing block was pinned in place.  The small sagittal saw was used as well as the lug drill to finish the femur.  The block and cut surfaces were removed and the medullary canal hole filled with autograft bone from the cut pieces.  The tibia was delivered forward in deep flexion and external rotation.  A size  4 tray was selected and pinned into place centered on the medial 1/3 of the tibial tubercle.  The reamer and keel was used to prepare the tibia through the tray.    I then trialed with the size E femur, size 4 tibia, a 10 mm insert and the 32 patella.  I had excellent flexion/extension gap balance, excellent patella tracking.  Flexion was full and beyond 120 degrees; extension was zero.  These components were chosen and the staff opened them to me on the back table while the knee was lavaged copiously and the cement mixed.  I cemented in the components and removed all excess cement.  The polyethylene tibial component was snapped into place and the knee placed in extension while cement was hardening.  The capsule was infilltrated with 20cc of .25% Marcaine with epinephrine.  A hemovac was place in the joint exiting superolaterally.  A pain pump was place superomedially superficial to the arthrotomy.  Once the cement was hard, the tourniquet was let down.  Hemostasis was obtained.  The arthrotomy was closed with figure-8 #1 vicryl sutures.  The deep soft tissues were closed with #0 vicryls and the subcuticular layer closed with a running #2-0 vicryl.  The skin was reapproximated and closed with skin staples.  The wound was dressed with xeroform, 4 x4's, 2 ABD sponges, a single layer of webril and a TED stocking.   The patient was then awakened, extubated, and taken to the recovery room in stable condition.  BLOOD LOSS:  300cc DRAINS: 1 hemovac, 1 pain catheter COMPLICATIONS:  None.  PLAN OF CARE: Admit to inpatient   PATIENT DISPOSITION:  PACU - hemodynamically stable.   Delay start of Pharmacological VTE agent (>24hrs) due to surgical blood loss or risk of bleeding:  not applicable  Please fax a copy of this op note to my office at [phone] (please only include page 1 and 2 of the Case Information op note)

## 2011-12-27 NOTE — Progress Notes (Signed)
  Georgena Spurling, MD   Altamese Cabal, PA-C [address] [place], [place], [place]  [address]                             [phone]   PROGRESS NOTE  Subjective:  negative for Chest Pain  negative for Shortness of Breath  negative for Nausea/Vomiting   negative for Calf Pain  negative for Bowel Movement   Tolerating Diet: yes         Patient reports pain as 5 on 0-10 scale.    Objective: Vital signs in last 24 hours:   Patient Vitals for the past 24 hrs:  BP Temp Pulse Resp SpO2  12/27/11 0535 134/44 mmHg 97.1 F (36.2 C) 98  18  93 %  12/27/11 0150 143/60 mmHg 97.4 F (36.3 C) 101  20  91 %  12/26/11 2120 129/74 mmHg 98.3 F (36.8 C) 93  18  95 %  12/26/11 1400 126/60 mmHg - - - -  12/26/11 1245 - 97.8 F (36.6 C) 95  12  94 %  12/26/11 1215 - 98 F (36.7 C) 87  15  93 %  12/26/11 1200 - - 92  16  94 %  12/26/11 1145 - - 85  17  95 %  12/26/11 1130 - - 87  18  93 %  12/26/11 1120 134/59 mmHg - 86  18  98 %  12/26/11 1115 - - 87  18  99 %  12/26/11 1103 148/65 mmHg 97.3 F (36.3 C) 92  15  99 %  12/26/11 0842 - - 76  18  98 %  12/26/11 0840 - - 73  18  96 %    @flow {1959:LAST@   Intake/Output from previous day:   04/29 0701 - 04/30 0700 In: 2635 [P.O.:460; I.V.:2175] Out: 1915 [Urine:1300; Drains:565]   Intake/Output this shift:       Intake/Output      04/29 0701 - 04/30 0700 04/30 0701 - 05/01 0700   P.O. 460    I.V. 2175    Total Intake 2635    Urine 1300    Drains 565    Blood 50    Total Output 1915    Net +720            LABORATORY DATA:  Basename 12/27/11 0610  WBC 23.6*  HGB 12.4  HCT 38.9  PLT 282    Basename 12/27/11 0610  NA 137  K 4.5  CL 100  CO2 28  BUN 8  CREATININE 0.49*  GLUCOSE 144*  CALCIUM 8.5   Lab Results  Component Value Date   INR 0.92 12/19/2011    Examination:  General appearance: alert, cooperative and no distress Extremities: Homans sign is negative, no sign of DVT  Wound Exam: clean, dry, intact     Drainage:  Scant/small amount Serosanguinous exudate  Motor Exam: EHL and FHL Intact  Sensory Exam: Deep Peroneal normal  Vascular Exam:    Assessment:    1 Day Post-Op  Procedure(s) (LRB): TOTAL KNEE ARTHROPLASTY (Right)  ADDITIONAL DIAGNOSIS:  Active Problems:  * No active hospital problems. *   Acute Blood Loss Anemia   Plan: Physical Therapy as ordered Weight Bearing as Tolerated (WBAT)  DVT Prophylaxis:  Lovenox  DISCHARGE PLAN: Home  DISCHARGE NEEDS: HHPT, CPM, Walker and 3-in-1 comode seat         Dnaiel Voller 12/27/2011, 8:29 AM

## 2011-12-27 NOTE — Progress Notes (Signed)
Occupational Therapy Evaluation Patient Details Name: Kimberly Love MRN: [id number] DOB: 08/10/1951 Today's Date: 12/27/2011 Time: 1005-1030 OT Time Calculation (min): 25 min  OT Assessment / Plan / Recommendation Clinical Impression  61 yo s/p R TKA. Pt will benefit from skilled OT services secondary to defecits below to max indep with ADL and functional mobility for ADL to facilitate D/C home with husband.    OT Assessment  Patient needs continued OT Services    Follow Up Recommendations  No OT follow up    Equipment Recommendations  None recommended by OT    Frequency Min 2X/week    Precautions / Restrictions Restrictions Weight Bearing Restrictions: No   Pertinent Vitals/Pain Lethargic - med related    ADL  Eating/Feeding: Simulated;Independent Where Assessed - Eating/Feeding: Chair Grooming: Simulated;Set up Where Assessed - Grooming: Supported sitting Upper Body Bathing: Simulated;Set up Where Assessed - Upper Body Bathing: Sitting, chair;Supported Lower Body Bathing: Simulated;Moderate assistance Where Assessed - Lower Body Bathing: Sit to stand from chair;Supported Location manager Dressing: Simulated;Set up Where Assessed - Upper Body Dressing: Sitting, bed;Supported Lower Body Dressing: Simulated;Moderate assistance Where Assessed - Lower Body Dressing: Sitting, chair Toilet Transfer: Not assessed;Other (comment) (due to lethargy) Ambulation Related to ADLs: Pt did not ambulate this am.Pt lethargic - apparently due to pain meds. ADL Comments: Assistance needed for ADL. Will review AE next session.    OT Goals Acute Rehab OT Goals OT Goal Formulation: With patient Time For Goal Achievement: 01/03/12 Potential to Achieve Goals: Good ADL Goals Pt Will Perform Lower Body Bathing: with modified independence;Sit to stand from chair;Unsupported;with adaptive equipment ADL Goal: Lower Body Bathing - Progress: Goal set today Pt Will Perform Lower Body Dressing:  with modified independence;Unsupported;with adaptive equipment;Sit to stand from chair ADL Goal: Lower Body Dressing - Progress: Goal set today Pt Will Transfer to Toilet: with modified independence;3-in-1;Ambulation;with DME ADL Goal: Toilet Transfer - Progress: Goal set today Pt Will Perform Toileting - Clothing Manipulation: Independently ADL Goal: Toileting - Clothing Manipulation - Progress: Goal set today Pt Will Perform Toileting - Hygiene: Independently;Sit to stand from 3-in-1/toilet ADL Goal: Toileting - Hygiene - Progress: Goal set today Pt Will Perform Tub/Shower Transfer: Tub transfer;with supervision;with caregiver independent in assisting;Transfer tub bench;with DME;with cueing (comment type and amount) ADL Goal: Tub/Shower Transfer - Progress: Goal set today  Visit Information  Last OT Received On: 12/27/11    Subjective Data      Prior Functioning  Home Living Lives With: Spouse Available Help at Discharge: Family Type of Home: House Home Layout: One level;Able to live on main level with bedroom/bathroom Bathroom Shower/Tub: Tub/shower unit;Curtain Bathroom Toilet: Standard Bathroom Accessibility: Yes Home Adaptive Equipment: Walker - rolling;Bedside commode/3-in-1;Tub transfer bench Prior Function Level of Independence: Independent Able to Take Stairs?: Yes Driving: Yes 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: No difficulties Dominant Hand: Right    Cognition  Overall Cognitive Status: Appears within functional limits for tasks assessed/performed Arousal/Alertness: Lethargic Orientation Level: Oriented X4 / Intact    Extremity/Trunk Assessment Right Upper Extremity Assessment RUE ROM/Strength/Tone: Within functional levels Left Upper Extremity Assessment LUE ROM/Strength/Tone: Within functional levels Trunk Assessment Trunk Assessment: Normal   Mobility Transfers Transfers: Sit to  Stand;Stand to Sit Sit to Stand: 3: Mod assist;With upper extremity assist;From chair/3-in-1   Exercise    Balance    End of Session OT - End of Session Equipment Utilized During Treatment: Gait belt Activity Tolerance: Patient limited by fatigue Patient left: in chair;with  chair alarm set   Arabelle Bollig,HILLARY 12/27/2011, 12:21 PM [facility], OTR/L  [phone] 12/27/2011

## 2011-12-27 NOTE — Evaluation (Signed)
Physical Therapy Evaluation Patient Details Name: Kimberly Love MRN: [id number] DOB: 1951/01/15 Today's Date: 12/27/2011 Time: 1478-2956 PT Time Calculation (min): 51 min  PT Assessment / Plan / Recommendation Clinical Impression  pt s/p R TKA. Presently limite by weakness and pain, but expect pt to make steady progress and be able to D/C home with HHPT.    PT Assessment  Patient needs continued PT services    Follow Up Recommendations  Home health PT    Equipment Recommendations  None recommended by PT    Frequency 7X/week    Precautions / Restrictions Precautions Precautions: Knee Precaution Booklet Issued: No Restrictions Weight Bearing Restrictions: Yes RLE Weight Bearing: Weight bearing as tolerated   Pertinent Vitals/Pain       Mobility  Bed Mobility Bed Mobility: Supine to Sit;Sitting - Scoot to Edge of Bed Supine to Sit: 4: Min assist;HOB elevated;Other (comment) (30 degrees) Sitting - Scoot to Edge of Bed: 4: Min assist Details for Bed Mobility Assistance: vc's for hand placement/ technique; min assist to come forward and assist with R LE Transfers Transfers: Sit to Stand;Stand to Sit;Stand Pivot Transfers Sit to Stand: 3: Mod assist;With upper extremity assist;From chair/3-in-1 Stand to Sit: 4: Min assist;With upper extremity assist;To chair/3-in-1 Stand Pivot Transfers: 3: Mod assist;With armrests Details for Transfer Assistance: vc's for hand placment, sequencing; lifting assist and assist with maneuvering RW Ambulation/Gait Stairs: No Wheelchair Mobility Wheelchair Mobility: No    Exercises Total Joint Exercises Ankle Circles/Pumps: AROM;20 reps;Both;Supine Heel Slides: AAROM;Right;10 reps;Supine   PT Goals Acute Rehab PT Goals PT Goal Formulation: With patient Time For Goal Achievement: 01/03/12 Potential to Achieve Goals: Good Pt will go Supine/Side to Sit: with supervision PT Goal: Supine/Side to Sit - Progress: Goal set today Pt will  go Sit to Stand: with supervision PT Goal: Sit to Stand - Progress: Goal set today Pt will Transfer Bed to Chair/Chair to Bed: with supervision PT Transfer Goal: Bed to Chair/Chair to Bed - Progress: Goal set today Pt will Ambulate: >150 feet;with modified independence;with least restrictive assistive device;with rolling walker PT Goal: Ambulate - Progress: Goal set today Pt will Perform Home Exercise Program: with supervision, verbal cues required/provided PT Goal: Perform Home Exercise Program - Progress: Goal set today  Visit Information  Last PT Received On: 12/27/11 Assistance Needed: +2 (safety)    Subjective Data  Subjective: I'm getting nauseous Patient Stated Goal: Independent   Prior Functioning  Home Living Lives With: Spouse Available Help at Discharge: Family Type of Home: House Home Layout: One level;Able to live on main level with bedroom/bathroom Bathroom Shower/Tub: Tub/shower unit;Curtain Bathroom Toilet: Standard Bathroom Accessibility: Yes Home Adaptive Equipment: Walker - rolling;Bedside commode/3-in-1;Tub transfer bench Prior Function Level of Independence: Independent Able to Take Stairs?: Yes Driving: Yes 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: No difficulties Dominant Hand: Right    Cognition  Overall Cognitive Status: Appears within functional limits for tasks assessed/performed Arousal/Alertness: Lethargic Orientation Level: Oriented X4 / Intact    Extremity/Trunk Assessment Right Upper Extremity Assessment RUE ROM/Strength/Tone: Within functional levels Left Upper Extremity Assessment LUE ROM/Strength/Tone: Within functional levels Right Lower Extremity Assessment RLE ROM/Strength/Tone: Within functional levels;Unable to fully assess Left Lower Extremity Assessment LLE ROM/Strength/Tone: Within functional levels LLE Coordination: WFL - gross/fine motor Trunk Assessment Trunk  Assessment: Normal   Balance Balance Balance Assessed: No  End of Session PT - End of Session Activity Tolerance: Patient tolerated treatment well;Other (comment) (limited by nausea) Patient left: in chair;with  call bell/phone within reach Nurse Communication: Mobility status CPM Right Knee CPM Right Knee: Off   Imaya Duffy, Eliseo Gum 12/27/2011, 1:32 PM  12/27/2011  Echo Bing, PT [phone] (pager)

## 2011-12-28 ENCOUNTER — Inpatient Hospital Stay (HOSPITAL_COMMUNITY): Payer: Managed Care, Other (non HMO)

## 2011-12-28 DIAGNOSIS — J9600 Acute respiratory failure, unspecified whether with hypoxia or hypercapnia: Secondary | ICD-10-CM | POA: Diagnosis not present

## 2011-12-28 DIAGNOSIS — E872 Acidosis: Secondary | ICD-10-CM

## 2011-12-28 DIAGNOSIS — R0902 Hypoxemia: Secondary | ICD-10-CM | POA: Diagnosis not present

## 2011-12-28 DIAGNOSIS — I517 Cardiomegaly: Secondary | ICD-10-CM

## 2011-12-28 DIAGNOSIS — R4182 Altered mental status, unspecified: Secondary | ICD-10-CM

## 2011-12-28 DIAGNOSIS — R5381 Other malaise: Secondary | ICD-10-CM

## 2011-12-28 DIAGNOSIS — E782 Mixed hyperlipidemia: Secondary | ICD-10-CM

## 2011-12-28 DIAGNOSIS — R5383 Other fatigue: Secondary | ICD-10-CM

## 2011-12-28 DIAGNOSIS — Z09 Encounter for follow-up examination after completed treatment for conditions other than malignant neoplasm: Secondary | ICD-10-CM

## 2011-12-28 DIAGNOSIS — J449 Chronic obstructive pulmonary disease, unspecified: Secondary | ICD-10-CM

## 2011-12-28 LAB — BASIC METABOLIC PANEL
BUN: 20 mg/dL (ref 6–23)
CO2: 32 mEq/L (ref 19–32)
CO2: 28 mEq/L (ref 19–32)
Calcium: 8.2 mg/dL — ABNORMAL LOW (ref 8.4–10.5)
Chloride: 100 mEq/L (ref 96–112)
Chloride: 100 mEq/L (ref 96–112)
Creatinine, Ser: 1.75 mg/dL — ABNORMAL HIGH (ref 0.50–1.10)
GFR calc Af Amer: 78 mL/min — ABNORMAL LOW (ref >90)
Glucose, Bld: 133 mg/dL — ABNORMAL HIGH (ref 70–99)
Potassium: 4.5 mEq/L (ref 3.5–5.1)
Sodium: 135 mEq/L (ref 135–145)

## 2011-12-28 LAB — CARDIAC PANEL(CRET KIN+CKTOT+MB+TROPI)
CK, MB: 26.4 ng/mL (ref 0.3–4.0)
Relative Index: 2.2 (ref 0.0–2.5)
Relative Index: 2.4 (ref 0.0–2.5)
Total CK: 892 U/L — ABNORMAL HIGH (ref 7–177)
Total CK: 1098 U/L — ABNORMAL HIGH (ref 7–177)

## 2011-12-28 LAB — URINALYSIS, ROUTINE W REFLEX MICROSCOPIC
Glucose, UA: NEGATIVE mg/dL
Protein, ur: NEGATIVE mg/dL
pH: 5 (ref 5.0–8.0)

## 2011-12-28 LAB — BLOOD GAS, ARTERIAL
Acid-base deficit: 0.1 mmol/L (ref 0.0–2.0)
Bicarbonate: 27.5 mEq/L — ABNORMAL HIGH (ref 20.0–24.0)
Drawn by: 257701
O2 Content: 4 L/min
O2 Saturation: 94 %
TCO2: 29.8 mmol/L (ref 0–100)
pCO2 arterial: 76.6 mmHg (ref 35.0–45.0)
pO2, Arterial: 69.1 mmHg — ABNORMAL LOW (ref 80.0–100.0)

## 2011-12-28 LAB — POCT I-STAT 3, ART BLOOD GAS (G3+)
Bicarbonate: 28.6 mEq/L — ABNORMAL HIGH (ref 20.0–24.0)
TCO2: 30 mmol/L (ref 0–100)
pH, Arterial: 7.279 — ABNORMAL LOW (ref 7.350–7.400)
pO2, Arterial: 118 mmHg — ABNORMAL HIGH (ref 80.0–100.0)

## 2011-12-28 LAB — COMPREHENSIVE METABOLIC PANEL
ALT: 73 U/L — ABNORMAL HIGH (ref 0–35)
AST: 54 U/L — ABNORMAL HIGH (ref 0–37)
Alkaline Phosphatase: 83 U/L (ref 39–117)
CO2: 28 mEq/L (ref 19–32)
Chloride: 100 mEq/L (ref 96–112)
GFR calc Af Amer: 42 mL/min — ABNORMAL LOW (ref >90)
GFR calc non Af Amer: 36 mL/min — ABNORMAL LOW (ref >90)
Glucose, Bld: 138 mg/dL — ABNORMAL HIGH (ref 70–99)
Sodium: 134 mEq/L — ABNORMAL LOW (ref 135–145)
Total Bilirubin: 0.2 mg/dL — ABNORMAL LOW (ref 0.3–1.2)

## 2011-12-28 LAB — CBC
Hemoglobin: 11.1 g/dL — ABNORMAL LOW (ref 12.0–15.0)
MCH: 29.1 pg (ref 26.0–34.0)
MCV: 95.5 fL (ref 78.0–100.0)
Platelets: 252 10*3/uL (ref 150–400)
RBC: 3.82 MIL/uL — ABNORMAL LOW (ref 3.87–5.11)
WBC: 22.4 10*3/uL — ABNORMAL HIGH (ref 4.0–10.5)

## 2011-12-28 LAB — URINE MICROSCOPIC-ADD ON

## 2011-12-28 MED ORDER — LACTATED RINGERS IV BOLUS (SEPSIS)
500.0000 mL | Freq: Once | INTRAVENOUS | Status: AC
  Administered 2011-12-28: 500 mL via INTRAVENOUS

## 2011-12-28 MED ORDER — TECHNETIUM TO 99M ALBUMIN AGGREGATED
3.0000 | Freq: Once | INTRAVENOUS | Status: AC | PRN
  Administered 2011-12-28: 3 via INTRAVENOUS

## 2011-12-28 MED ORDER — NALOXONE NEWBORN-WH INJECTION 0.4 MG/ML: 0.4000 mg | Freq: Once | INTRAMUSCULAR | Status: DC

## 2011-12-28 MED ORDER — NALOXONE HCL 0.4 MG/ML IJ SOLN: 0.4000 mg | INTRAMUSCULAR | Status: DC | PRN

## 2011-12-28 MED ORDER — SODIUM CHLORIDE 0.9 % IV SOLN
INTRAVENOUS | Status: DC
  Administered 2011-12-28: 50 mL/h via INTRAVENOUS

## 2011-12-28 MED ORDER — NALOXONE HCL 0.4 MG/ML IJ SOLN
0.4000 mg | Freq: Once | INTRAMUSCULAR | Status: AC
  Administered 2011-12-28: 0.4 mg via INTRAMUSCULAR

## 2011-12-28 MED ORDER — NALOXONE HCL 0.4 MG/ML IJ SOLN
0.2500 mg/h | INTRAVENOUS | Status: DC
  Administered 2011-12-28 – 2011-12-29 (×2): 0.25 mg/h via INTRAVENOUS
  Filled 2011-12-28 (×4): qty 2.5

## 2011-12-28 MED ORDER — MOXIFLOXACIN HCL IN NACL 400 MG/250ML IV SOLN
400.0000 mg | INTRAVENOUS | Status: DC
  Filled 2011-12-28 (×2): qty 250

## 2011-12-28 NOTE — Progress Notes (Signed)
PT Cancellation Note  2nd PT session cancelled today due to patient receiving procedure or test.  Pt off floor for procedure.    Lara Mulch 12/28/2011, 12:57 PM  Verdell Face, PTA [phone] 12/28/2011

## 2011-12-28 NOTE — Progress Notes (Signed)
eLink Physician-Brief Progress Note Patient Name: DAESIA ZYLKA DOB: 30-Apr-1951 MRN: [id number]  Date of Service  12/28/2011   HPI/Events of Note   Improved Mental status however, see prior ABG opoids in setting arf  eICU Interventions  abg to consider narcan drip needs   Intervention Category Minor Interventions: Clinical assessment - ordering diagnostic tests  Nelda Bucks. 12/28/2011, 6:47 PM

## 2011-12-28 NOTE — Progress Notes (Signed)
Patient is more lethargic and slower to respond but responds appropriately. VS have not changed from previous results. Patient's breathing has changed to more labored. Rapid response was notified for closer evaluation. Will continue to monitor patient. MD NOTIFIED orders received. Results of abg called back to md.

## 2011-12-28 NOTE — Progress Notes (Signed)
Name: Kimberly Love MRN: [id number] DOB: Nov 07, 1950  PCCM PROGRESS NOTE   Lab 12/28/11 1652  PHART 7.180*  PCO2ART 76.6*  PO2ART 69.1*  HCO3 27.5*  TCO2 29.8  O2SAT 94.0    Reviewed ABG results:  Respiratory acidosis with hypoxemia.  Patient evaluated: sleepy but arouses with stimulation, protects airway, small tidal volumes, no signs of distress.  Increased IPAP to 15 (above PEEP).  Started Narcan gtt.  Will recheck ABG at 21:00.  Orlean Bradford, M.D., F.C.C.P. Pulmonary and Critical Care Medicine [facility] Cell: [phone] Pager: [phone]  12/28/2011, 7:22 PM

## 2011-12-28 NOTE — Progress Notes (Addendum)
CRITICAL VALUE ALERT  Critical value received:  19.8  Date of notification:  5 1 13   Time of notification:  1340  Critical value read back:yes  Nurse who received alert:  Maryfrances Bunnell RN  MD notified (1st page):  Blake Divine  Time of first page:  1345  MD notified (2nd page):  Time of second page:  Responding MD:  Blake Divine  Time MD responded:  1400

## 2011-12-28 NOTE — Consult Note (Signed)
Name: Kimberly Love MRN: [id number] DOB: [birth year]    LOS: 2  [place] Note   History of Present Illness:  61 y/o WF, current smoker, with PMH of HTN, Anxiety / Depression and Knee pain s/p arthroscopy & medical mgmt admitted on 4/29 for planned R Total Knee Replacement secondary to end stage arthritis.  4/30 was noted to have decline in mental status.  5/1 had further decline.  ABG obtained reflective of hypercarbic respiratory failure, noted tripling of creatinine.  4/29, 4/30 patient received several narcotics / benzo's.  5/1 PCCM consulted for hypercarbic respiratory failure, AMS.    Lines / Drains:   Cultures: 5/1 UC>>>  Antibiotics: 4/29 Cefazolin (OR)  Tests / Events: 5/1 VQ>>>neg 5/1 CT HEAD>>>neg   Past Medical History  Diagnosis Date  . Complication of anesthesia     has seizure in 1973  while waking up  . Seizures     x1  . Anxiety   . Depression   . Hypertension     dr Timothy Lasso   halll  [place]  . PONV (postoperative nausea and vomiting)     Past Surgical History  Procedure Date  . Tonsillectomy   . Cholecystectomy   . Breast surgery     bio  2012   neg  . Abdominal hysterectomy   . Orthorscopy     rt knee  . Total knee arthroplasty 12/26/2011    Procedure: TOTAL KNEE ARTHROPLASTY;  Surgeon: Raymon Mutton, MD;  Location: [facility] OR;  Service: Orthopedics;  Laterality: Right;  right total knee arthroplasty    Prior to Admission medications   Medication Sig Start Date End Date Taking? Authorizing Provider  citalopram (CELEXA) 20 MG tablet Take 20 mg by mouth daily.   Yes Historical Provider, MD  ibuprofen (ADVIL,MOTRIN) 200 MG tablet Take 600-800 mg by mouth every 6 (six) hours as needed. For pain   Yes Historical Provider, MD  lisinopril-hydrochlorothiazide (PRINZIDE,ZESTORETIC) 20-25 MG per tablet Take 1 tablet by mouth daily.   Yes Historical Provider, MD  LORazepam (ATIVAN) 1 MG tablet Take 1-3 mg by mouth at bedtime.  Depends on pain   Yes Historical Provider, MD    Allergies No Known Allergies  Family History History reviewed. No pertinent family history.  Social History  reports that she has been smoking Cigarettes.  She has a 8 pack-year smoking history. She does not have any smokeless tobacco history on file. She reports that she drinks alcohol. She reports that she does not use illicit drugs.  Review Of Systems: unable to complete as pt is lethargic  Vital Signs: Temp:  [97.3 F (36.3 C)-98.3 F (36.8 C)] 98.3 F (36.8 C) (05/01 1400) Pulse Rate:  [80-94] 90  (05/01 1638) Resp:  [16-20] 16  (05/01 1638) BP: (92-125)/(44-52) 114/45 mmHg (05/01 1638) SpO2:  [87 %-97 %] 94 % (05/01 1640) I/O last 3 completed shifts: In: 2675 [P.O.:600; I.V.:2075] Out: 1100 [Urine:1100]  Physical Examination: General: morbidly obese on BiPap Neuro: lethargic, arouses to verbal stimuli, one word answers then drifts back to sleep, MAE spontaneously CV: s1s2 distant but regular PULM: resp's even/non-labored, lungs bilaterally clear but diminished WU:JWJXB/JYNW, bsx4 active Extremities: warm/dry, R leg dressing c/d/i   Labs    CBC  Lab 12/28/11 0605 12/27/11 0610  HGB 11.1* 12.4  HCT 36.5 38.9  WBC 22.4* 23.6*  PLT 252 282     BMET  Lab 12/28/11 0934 12/28/11 0605 12/27/11 0610  NA 134* 135 137  K 4.9 5.5* --  CL 100 100 100  CO2 28 32 28  GLUCOSE 138* 133* 144*  BUN 17 16 8   CREATININE 1.53* 1.75* 0.49*  CALCIUM 8.4 8.2* 8.5  MG -- -- --  PHOS -- -- --    Lab 12/28/11 1652  PHART 7.180*  PCO2ART 76.6*  PO2ART 69.1*  HCO3 27.5*  TCO2 29.8  O2SAT 94.0     Radiology: 5/1 CXR>>>Enlargement of cardiac silhouette with pulmonary vascular congestion. Question mild perihilar edema with bibasilar atelectasis    Assessment and Plan:  Hypercarbic Respiratory Failure Assessment: S/P R total knee replacement with narcotics post-op on 4/29, 4/30.  Reversed with narcan with improvement  in mental status. Plan: -narcan PRN -Support with BiPap as needed -d/c narcs -Will need to address pain as it arises  Acute Renal failure Assessment: On ACE-I at home and received post-op.  Sr CR tripled post-op.  Likely related to ACE and mild hypotension in setting of narcotics.  Plan: -Gentle volume but watch for pulmonary vascular congestion, will only use 50 ml/hr x1L. -Hold ACE (has been d/c'd) -Renal ultrasound  Best practices / Disposition: -->Code Status: Full Code -->DVT Px: lovenox -->GI Px: not indicated -->Diet:PO  Canary Brim, NP-C [facility] Pgr: [phone]  12/28/2011, 5:39 PM  As above, gentle hydration, d/c narcs, PRN narcan, d/c ace, renal U/S.  PRN BiPAP.  Will f/u in AM.  CC time 50 min.  Patient seen and examined, agree with above note.  I dictated the care and orders written for this patient under my direction.  Koren Bound, M.D. [phone]

## 2011-12-28 NOTE — Progress Notes (Signed)
Patient was transferred to ICU room 2107 with respiratory and rapid response nurse. Patient tolerated well. Report called to [facility].

## 2011-12-28 NOTE — Consult Note (Signed)
Medical Consultation  TRENITY PHA ZOX:[id number] DOB: April 19, 1951 DOA: 12/26/2011  Referring physician: PCP: Dwana Melena, MD, MD   Date of consultation: 12/28/2011 Reason for consultation: LETHARGY,  Shortness of breath.  Chief Complaint: lethargy, sob.  HPI:  61 year old lady with h/o HTN, depression, came in for worsening painful right knee. In the past she was treated conservatively with NSAIDS which haven't improved her pain. She was admitted on 4/29 for possible TKR. She underwent TKR on 4/29, and since yesterday she became lethargic, and was short of breath. Pt appears drowsy, answering simple questions. She denies any chest pain, palpitations, dizziness, and cough . She denies any nausea, vomiting and abdominal pain. She denies fever or chills. She reports feeling sleepy since yesterday. No urinary complaints. She reports some right calf pain.      Past Medical History  Diagnosis Date  . Complication of anesthesia     has seizure in 1973  while waking up  . Seizures     x1  . Anxiety   . Depression   . Hypertension     dr Timothy Lasso   halll  [place]  . PONV (postoperative nausea and vomiting)    Past Surgical History  Procedure Date  . Tonsillectomy   . Cholecystectomy   . Breast surgery     bio  2012   neg  . Abdominal hysterectomy   . Orthorscopy     rt knee  . Total knee arthroplasty 12/26/2011    Procedure: TOTAL KNEE ARTHROPLASTY;  Surgeon: Raymon Mutton, MD;  Location: [facility] OR;  Service: Orthopedics;  Laterality: Right;  right total knee arthroplasty   Social History:  reports that she has been smoking Cigarettes.  She has a 8 pack-year smoking history. She does not have any smokeless tobacco history on file. She reports that she drinks alcohol. She reports that she does not use illicit drugs.  No Known Allergies History reviewed. No pertinent family history.  Prior to Admission medications   Medication Sig Start Date End Date Taking? Authorizing Provider    citalopram (CELEXA) 20 MG tablet Take 20 mg by mouth daily.   Yes Historical Provider, MD  ibuprofen (ADVIL,MOTRIN) 200 MG tablet Take 600-800 mg by mouth every 6 (six) hours as needed. For pain   Yes Historical Provider, MD  lisinopril-hydrochlorothiazide (PRINZIDE,ZESTORETIC) 20-25 MG per tablet Take 1 tablet by mouth daily.   Yes Historical Provider, MD  LORazepam (ATIVAN) 1 MG tablet Take 1-3 mg by mouth at bedtime. Depends on pain   Yes Historical Provider, MD   Physical Exam: Blood pressure 103/50, pulse 80, temperature 97.3 F (36.3 C), temperature source Oral, resp. rate 20, SpO2 94.00%. Filed Vitals:   12/28/11 0044 12/28/11 0242 12/28/11 0300 12/28/11 0600  BP:  92/48 100/52 103/50  Pulse:  84 80 80  Temp:    97.3 F (36.3 C)  TempSrc:      Resp:    20  SpO2: 90% 89%  94%  Constitutional: Vital signs reviewed.  Patient is a well-developed and well-nourished  in no acute distress and cooperative with exam. Alert and oriented to place and person not oriented to time.  Head: Normocephalic and atraumatic Mouth: no erythema or exudates, MMM Eyes: PERRL, EOMI, conjunctivae normal, No scleral icterus.  Neck: Supple, Trachea midline normal ROM, No JVD, mass, thyromegaly, or carotid bruit present.  Cardiovascular: RRR, S1 normal, S2 normal, no MRG, pulses symmetric and intact bilaterally Pulmonary/Chest: CTAB, no wheezes, rales, or rhonchi Abdominal: Soft.  Non-tender, non-distended, bowel sounds are normal, no masses, organomegaly, or guarding present.  Musculoskeletal: RLE in bandage. Tender right calf.  Neurological: Alert, Strenght is normal and symmetric bilaterally, cranial nerve II-XII are grossly intact, no focal motor deficit, sensory intact to light touch bilaterally.  Skin: Warm, dry and intact. No rash, cyanosis, or clubbing.      Labs on Admission:  Basic Metabolic Panel:  Lab 12/28/11 4782 12/27/11 0610  NA 135 137  K 5.5* 4.5  CL 100 100  CO2 32 28  GLUCOSE  133* 144*  BUN 16 8  CREATININE 1.75* 0.49*  CALCIUM 8.2* 8.5  MG -- --  PHOS -- --   Liver Function Tests: No results found for this basename: AST:5,ALT:5,ALKPHOS:5,BILITOT:5,PROT:5,ALBUMIN:5 in the last 168 hours No results found for this basename: LIPASE:5,AMYLASE:5 in the last 168 hours No results found for this basename: AMMONIA:5 in the last 168 hours CBC:  Lab 12/28/11 0605 12/27/11 0610  WBC 22.4* 23.6*  NEUTROABS -- --  HGB 11.1* 12.4  HCT 36.5 38.9  MCV 95.5 91.5  PLT 252 282   Cardiac Enzymes: No results found for this basename: CKTOTAL:5,CKMB:5,CKMBINDEX:5,TROPONINI:5 in the last 168 hours BNP: No components found with this basename: POCBNP:5 CBG: No results found for this basename: GLUCAP:5 in the last 168 hours  Radiological Exams on Admission: No results found.  EKG: Independently reviewed.   Impression/Recommendations 1. Lethargy& sob: will rule out infection. CXR Urine analysis done, she was found to have UTI  And CXR  Shows, enlargement of cardiac silhouette with vascular congestion and mild peri hilar Infiltrate/ edema with  atelectasis.  She has Leukocytosis of 22,000, without fever or chills. Will treat her with Avelox should cover her early pneumonia and UTI. I have also ordered a NM V/Q scan to evaluate for PE. CT angiogram couldn't be done secondary to renal insufficiency. LImit the use of  sedative pain medications. 2. Enlarged Cardiac silhouette with vascular congestion: will get a pro BNP, get cardiac enzymes and a 2d echocardiogram.  3. Leukocytosis: probably secondary to infection.   I will followup again in the morning. Please contact me if I can be of assistance in the meanwhile. Thank you for this consultation.  Kathlen Mody, MD  [facility] Pager [phone] 12/28/2011, 9:11 AM

## 2011-12-28 NOTE — Progress Notes (Signed)
*  PRELIMINARY RESULTS* Vascular Ultrasound Bilateral lower extremity venous duplex has been completed.   No evidence of lower extremity deep vein thrombosis bilaterally. All veins compressible.  Malachy Moan, RDMS, RDCS 12/28/2011, 4:07 PM

## 2011-12-28 NOTE — Progress Notes (Signed)
Patient has been unable to void since foley was removed this morning. Patient was bladder scanned and only 93ml were scanned. Patient has gone 8 hours since foley was removed, no indication the patient has to or feels like the need to void. MD notified, orders to place the foley back and increase fluids were received.  Will continue to monitor output closely.

## 2011-12-28 NOTE — Progress Notes (Signed)
  Georgena Spurling, MD   Altamese Cabal, PA-C [address] [place], [place], [place]  [address]                             [phone]   PROGRESS NOTE  Subjective:  negative for Chest Pain  positive for Shortness of Breath  negative for Nausea/Vomiting   negative for Calf Pain  negative for Bowel Movement   Tolerating Diet: yes         Patient reports pain as 2 on 0-10 scale.    Objective: Vital signs in last 24 hours:   Patient Vitals for the past 24 hrs:  BP Temp Temp src Pulse Resp SpO2  12/28/11 0600 103/50 mmHg 97.3 F (36.3 C) - 80  20  94 %  12/28/11 0300 100/52 mmHg - - 80  - -  12/28/11 0242 92/48 mmHg - - 84  - 89 %  12/28/11 0044 - - - - - 90 %  12/27/11 2145 125/44 mmHg 97.4 F (36.3 C) - 94  16  97 %  12/27/11 2130 125/44 mmHg 97.4 F (36.3 C) Oral 94  16  93 %  12/27/11 2100 - - - - - 87 %  12/27/11 1400 104/46 mmHg 97.5 F (36.4 C) - 84  16  94 %    @flow {1959:LAST@   Intake/Output from previous day:   04/30 0701 - 05/01 0700 In: 1440 [P.O.:240; I.V.:1200] Out: 300 [Urine:300]   Intake/Output this shift:       Intake/Output      04/30 0701 - 05/01 0700 05/01 0701 - 05/02 0700   P.O. 240    I.V. 1200    Total Intake 1440    Urine 300    Drains     Blood     Total Output 300    Net +1140            LABORATORY DATA:  Basename 12/28/11 0605 12/27/11 0610  WBC 22.4* 23.6*  HGB 11.1* 12.4  HCT 36.5 38.9  PLT 252 282    Basename 12/28/11 0934 12/28/11 0605 12/27/11 0610  NA 134* 135 137  K 4.9 5.5* 4.5  CL 100 100 100  CO2 28 32 28  BUN 17 16 8   CREATININE 1.53* 1.75* 0.49*  GLUCOSE 138* 133* 144*  CALCIUM 8.4 8.2* 8.5   Lab Results  Component Value Date   INR 0.92 12/19/2011    Examination:  General appearance: alert, cooperative and no distress Extremities: Homans sign is negative, no sign of DVT  Wound Exam: clean, dry, intact   Drainage:  None: wound tissue dry  Motor Exam: EHL and FHL Intact  Sensory Exam: Deep  Peroneal normal  Vascular Exam:    Assessment:    2 Days Post-Op  Procedure(s) (LRB): TOTAL KNEE ARTHROPLASTY (Right)  ADDITIONAL DIAGNOSIS:  Active Problems:  * No active hospital problems. *   Acute Blood Loss Anemia   Plan: Physical Therapy as ordered Weight Bearing as Tolerated (WBAT)  DVT Prophylaxis:  Lovenox  DISCHARGE PLAN: Home  DISCHARGE NEEDS: HHPT, CPM, Walker and 3-in-1 comode seat  Pt SOB and lethargic.  Medicine consult called for further eval. Cancel expected discharge today.         Olivene Cookston 12/28/2011, 11:14 AM

## 2011-12-28 NOTE — Progress Notes (Signed)
OT Cancellation Note  Treatment cancelled today due to patient receiving procedure or test.   [facility] Domonique Cothran, OTR/L  [phone] 12/28/2011 12/28/2011, 1:52 PM

## 2011-12-28 NOTE — Progress Notes (Signed)
PT Progress Note:      12/28/11 1100  PT Visit Information  Last PT Received On 12/28/11  Assistance Needed +2  PT Time Calculation  PT Start Time 0805  PT Stop Time 0839  PT Time Calculation (min) 34 min  Subjective Data  Subjective "I can't do it"  Precautions  Precautions Knee  Restrictions  RLE Weight Bearing WBAT  Cognition  Overall Cognitive Status Appears within functional limits for tasks assessed/performed  Arousal/Alertness Lethargic  Orientation Level Oriented X4 / Intact  Behavior During Session Lethargic  Cognition - Other Comments Pt very lethargic/drowsy upon arrival.  Eyes were open but appeared to be glazed over.  Pt able to answer questions appropriatly but required max cueing throughout entire session to stay alert.    Bed Mobility  Bed Mobility Supine to Sit;Sitting - Scoot to [place] of Bed;Sit to Supine;Scooting to [facility]  Supine to Sit 1: +2 Total assist;With rails;HOB elevated (HOB elevated 30 degrees)  Supine to Sit: Patient Percentage 50%  Sitting - Scoot to Edge of Bed 4: Min assist  Sit to Supine 1: +2 Total assist;HOB flat  Sit to Supine: Patient Percentage 50%  Scooting to HOB 1: +2 Total assist;With rail  Scooting to [facility]: Patient Percentage 60%  Details for Bed Mobility Assistance Pt very drowsy/lethargic.  Cues for sequencing & technique.  (A) for LE's & shoulders/trunk.    Transfers  Transfers Sit to Stand;Stand to Sit  Sit to Stand From bed;1: +2 Total assist;With upper extremity assist  Sit to Stand: Patient Percentage 70%  Stand to Sit 1: +2 Total assist;With upper extremity assist;To bed;Other (comment)  Stand to Sit: Patient Percentage 70%  Details for Transfer Assistance +2 for safety due to pt lethargic.  Max cues for hand placment, technique, & safety.  (A) to achieve standing, balance, controlled descent, & safety.  Pt quick to say "I can't"- Requires encouragement to continue & complete task.    Ambulation/Gait  Ambulation/Gait Assistance  Not tested (comment)  Exercises  Exercises Total Joint  Total Joint Exercises  Ankle Circles/Pumps AROM;20 reps;Both;Supine  Heel Slides AAROM;Right;10 reps;Supine  Quad Sets AROM;Strengthening;Both;15 reps;Supine  Straight Leg Raises AAROM;Strengthening;Right;10 reps;Supine  Long Arc Quad AAROM;Strengthening;Right;10 reps;Seated  PT - End of Session  Equipment Utilized During Treatment Gait belt  Activity Tolerance Other (comment) (lethargy)  Patient left in bed;with call bell/phone within reach  Nurse Communication Mobility status;Other (comment) (alertness during session)  PT - Assessment/Plan  Comments on Treatment Session Pt's progression limited by lethargy still to date- Pt did have increased alertness with activity but eyes still very glazed over & pt required frequent cueing to remain alert for tasks.   Pt refusing to ambulate despite encouragement.  If pt does not progress with mobility then she may need ST-SNF prior to d/c home.    PT Plan Discharge plan remains appropriate  PT Frequency 7X/week  Recommendations for Other Services OT consult  Follow Up Recommendations Home health PT  Equipment Recommended None recommended by PT  Acute Rehab PT Goals  PT Goal: Supine/Side to Sit - Progress Not met  PT Goal: Sit to Stand - Progress Not met  PT Goal: Perform Home Exercise Program - Progress Not met      Kimberly Love, [place] [phone] 12/28/2011

## 2011-12-28 NOTE — Significant Event (Signed)
Rapid Response Event Note  Overview: Time Called: 1630 Arrival Time: 1635 Event Type: Respiratory  Initial Focused Assessment: Called to patient's bedside because, lethargy and desat.   BP 114/45, HR 90, RR 16, O2 sat 88% on 3L [place] O2 to 4L [place] sat 94%. Lung sounds decreased, crackles in bases.  Reg Heart tones, Patient lethargic, able to arouse for short periods, O x 2, person & place.   Interventions: ABG and 12 lead EKG done. ABG ph 7.18/ pCO2 76/ pO2 69/ bicarb 27. RT placed pt on Bipap Dr Molli Knock at bedside to assess pt. Pt transferred to 2107 via bed, with O2 via Bipap and heart monitor. Narcan given upon arrival.  Event Summary:   at      at          Marcellina Millin

## 2011-12-28 NOTE — Progress Notes (Signed)
At beginning of shift pt was very lethargic and difficult to arouse which was the info given during report for the day shift as well. Per report foley was replaced r/t inability to void around 1800 on 12/27/11. By 2330 pt put out 75 cc of urine since 7pm which equals a total of 175 since 7am. Dr. Sherlean Foot paged and orders received to give LR bolus and to monitor. Once this was given urine output began to slowly increase just slightly. During night pt began to slowly become more alert and increased talking. O2 sats remains in the high 80's to low 90's during night on 3L [place]. Pt encouraged frequently to take deep breathes and to increase fluid intake. No pain meds given through night. Vital signs stable. No c/o pain. Will continue to monitor.

## 2011-12-29 ENCOUNTER — Inpatient Hospital Stay (HOSPITAL_COMMUNITY): Payer: Managed Care, Other (non HMO)

## 2011-12-29 DIAGNOSIS — R5381 Other malaise: Secondary | ICD-10-CM

## 2011-12-29 DIAGNOSIS — E782 Mixed hyperlipidemia: Secondary | ICD-10-CM

## 2011-12-29 DIAGNOSIS — E872 Acidosis: Secondary | ICD-10-CM

## 2011-12-29 DIAGNOSIS — R5383 Other fatigue: Secondary | ICD-10-CM

## 2011-12-29 LAB — CBC
HCT: 30.1 % — ABNORMAL LOW (ref 36.0–46.0)
Hemoglobin: 9.5 g/dL — ABNORMAL LOW (ref 12.0–15.0)
MCH: 28.9 pg (ref 26.0–34.0)
MCV: 91.5 fL (ref 78.0–100.0)
Platelets: 214 10*3/uL (ref 150–400)
RBC: 3.29 MIL/uL — ABNORMAL LOW (ref 3.87–5.11)
WBC: 15.5 10*3/uL — ABNORMAL HIGH (ref 4.0–10.5)

## 2011-12-29 LAB — CARDIAC PANEL(CRET KIN+CKTOT+MB+TROPI)
CK, MB: 19 ng/mL (ref 0.3–4.0)
Total CK: 922 U/L — ABNORMAL HIGH (ref 7–177)

## 2011-12-29 LAB — PHOSPHORUS: Phosphorus: 1.7 mg/dL — ABNORMAL LOW (ref 2.3–4.6)

## 2011-12-29 LAB — URINE CULTURE
Colony Count: NO GROWTH
Culture  Setup Time: 201305011201

## 2011-12-29 LAB — BASIC METABOLIC PANEL
CO2: 27 mEq/L (ref 19–32)
Calcium: 8.5 mg/dL (ref 8.4–10.5)
Chloride: 102 mEq/L (ref 96–112)
Creatinine, Ser: 0.67 mg/dL (ref 0.50–1.10)
Glucose, Bld: 139 mg/dL — ABNORMAL HIGH (ref 70–99)

## 2011-12-29 LAB — MAGNESIUM: Magnesium: 2 mg/dL (ref 1.5–2.5)

## 2011-12-29 MED ORDER — TRAMADOL HCL 50 MG PO TABS
50.0000 mg | ORAL_TABLET | ORAL | Status: DC | PRN
  Administered 2011-12-29: 100 mg via ORAL
  Administered 2011-12-30: 50 mg via ORAL
  Filled 2011-12-29: qty 2
  Filled 2011-12-29: qty 1

## 2011-12-29 NOTE — Progress Notes (Signed)
Kimberly Love is a 61 y.o. female smoker admitted on 12/26/2011 with right knee pain for right knee arthroplasty.  She was receiving benzo's, narcotics post-op.  She was noted to be lethargic and short of breath 5/1 and found to have acute renal failure and hypercapnic respiratory failure requiring BPAP.  PCCM consulted 5/1.  Cultures: Urine 5/1>>  Antibiotics: Cefazolin 4/29>> Avelox 5/1>>5/2  Tests/events: 5/1 VQ>>>very low probability for PE 5/1 CT HEAD>>>negative 5/1 Echo>>mild LVH, EF 60 to 65%, grade 1 diastolic dysfx, mild LA dilation 5/1 Lower ext doppler>>no DVT 5/2 Renal u/s>>normal study  SUBJECTIVE: Denies chest pain, dyspnea, abd pain.  Feels hungry.  Leg pain okay.  OBJECTIVE:  Blood pressure 110/56, pulse 86, temperature 98.4 F (36.9 C), temperature source Oral, resp. rate 21, height 5\' 6"  (1.676 m), weight 264 lb 12.4 oz (120.1 kg), SpO2 95.00%. Wt Readings from Last 3 Encounters:  12/29/11 264 lb 12.4 oz (120.1 kg)  12/29/11 264 lb 12.4 oz (120.1 kg)  12/19/11 251 lb (113.853 kg)   Body mass index is 42.74 kg/(m^2).  I/O last 3 completed shifts: In: 2725 [P.O.:300; I.V.:2425] Out: 2050 [Urine:2050]  Physical Exam: General - no distress, sitting in chair HEENT - no sinus tenderness, no oral exudate Cardiac - s1s2 regular Chest - no wheeze/rales Abd - soft, nontender, + bowel sounds Ext - Rt leg in wrap Neuro - A&O x 3, normal strength Psych - normal mood, behavior  CBC    Component Value Date/Time   WBC 15.5* 12/29/2011 0355   RBC 3.29* 12/29/2011 0355   HGB 9.5* 12/29/2011 0355   HCT 30.1* 12/29/2011 0355   PLT 214 12/29/2011 0355   MCV 91.5 12/29/2011 0355   MCH 28.9 12/29/2011 0355   MCHC 31.6 12/29/2011 0355   RDW 14.9 12/29/2011 0355   LYMPHSABS 3.0 12/19/2011 0933   MONOABS 0.8 12/19/2011 0933   EOSABS 0.3 12/19/2011 0933   BASOSABS 0.0 12/19/2011 0933    BMET    Component Value Date/Time   NA 137 12/29/2011 0355   K 4.3 12/29/2011 0355   CL 102  12/29/2011 0355   CO2 27 12/29/2011 0355   GLUCOSE 139* 12/29/2011 0355   BUN 17 12/29/2011 0355   CREATININE 0.67 12/29/2011 0355   CALCIUM 8.5 12/29/2011 0355   GFRNONAA >90 12/29/2011 0355   GFRAA >90 12/29/2011 0355   Lab Results  Component Value Date   ALT 73* 12/28/2011   AST 54* 12/28/2011   ALKPHOS 83 12/28/2011   BILITOT 0.2* 12/28/2011   ABG    Component Value Date/Time   PHART 7.279* 12/28/2011 2134   PCO2ART 60.8* 12/28/2011 2134   PO2ART 118.0* 12/28/2011 2134   HCO3 28.6* 12/28/2011 2134   TCO2 30 12/28/2011 2134   ACIDBASEDEF 0.1 12/28/2011 1652   O2SAT 98.0 12/28/2011 2134    Dg Chest 2 View  12/28/2011  *RADIOLOGY REPORT*  Clinical Data: Hypoxemia, lethargy, shortness of breath, history seizures, hypertension  CHEST - 2 VIEW  Comparison: 12/19/2011  Findings: Enlargement of cardiac silhouette with pulmonary vascular congestion. Question minimal perihilar infiltrate versus edema. Minimal basilar atelectasis. No definite pleural effusion or pneumothorax. Elongation thoracic aorta. Multilevel endplate spur formation thoracic spine. Prominent air fluid level within stomach.  IMPRESSION: Enlargement of cardiac silhouette with pulmonary vascular congestion. Question mild perihilar edema with bibasilar atelectasis.  Original Report Authenticated By: Lollie Marrow, M.D.   Ct Head Wo Contrast  12/28/2011  *RADIOLOGY REPORT*  Clinical Data: 61 year old female with weakness, lethargy.  CT HEAD WITHOUT CONTRAST  Technique:  Contiguous axial images were obtained from the base of the skull through the vertex without contrast.  Comparison: None.  Findings: Visualized orbits and scalp soft tissues are within normal limits.  Visualized paranasal sinuses and mastoids are clear.  No acute osseous abnormality identified.  Cerebral volume is within normal limits for age.  No midline shift, ventriculomegaly, mass effect, evidence of mass lesion, intracranial hemorrhage or evidence of cortically based acute infarction.   Gray-white matter differentiation is within normal limits throughout the brain.  No suspicious intracranial vascular hyperdensity.  IMPRESSION: Normal noncontrast CT appearance of the brain.  Original Report Authenticated By: Ulla Potash III, M.D.   Nm Pulmonary Perfusion  12/28/2011  *RADIOLOGY REPORT*  Clinical Data:   recent knee surgery.  Short of breath.  Concern pulmonary embolism.  NUCLEAR MEDICINE VENTILATION - PERFUSION LUNG SCAN  Technique:    Perfusion images were obtained in multiple projections after intravenous injection of Tc-14m MAA.  Radiopharmaceuticals: 3.0 mCi Tc-68m MAA.  Comparison: Chest radiograph 12/28/2011  Findings: .  Perfusion:  No wedge shaped peripheral perfusion defects to suggest acute pulmonary embolism  IMPRESSION:  Very low probability for acute pulmonary embolism.  Original Report Authenticated By: Genevive Bi, M.D.   US Renal Port  12/29/2011  *RADIOLOGY REPORT*  Clinical Data: Rule out hydronephrosis.  Elevated BUN and creatinine.  RENAL/URINARY TRACT ULTRASOUND COMPLETE  Comparison:  None  Findings:  Right Kidney:  12.1 cm. No hydronephrosis.  Normal renal cortical thickness and echogenicity.  Left Kidney:  13.2 cm. No hydronephrosis.  Normal renal cortical thickness and echogenicity.  Bladder:  Foley catheter within urinary bladder.  The bladder is not entirely collapsed.  Example image 12.  IMPRESSION:  1.  No urinary tract obstruction or explanation for elevated BUN/creatinine. 2.  Foley catheter within urinary bladder.  The bladder is incompletely collapsed.  Correlate with catheter function.  Original Report Authenticated By: Consuello Bossier, M.D.   Dg Chest Port 1 View  12/29/2011  *RADIOLOGY REPORT*  Clinical Data: Respiratory failure, shortness of breath  PORTABLE CHEST - 1 VIEW  Comparison: Chest x-ray of 12/28/2011  Findings: The lungs appear better aerated.  No definite pulmonary vascular congestion is seen.  Cardiomegaly is stable.  No effusion is noted.   There are degenerative changes in the thoracic spine.  IMPRESSION: Stable cardiomegaly.  Slightly better aeration.  No definite pulmonary vascular congestion.  Original Report Authenticated By: Juline Patch, M.D.    ASSESSMENT/PLAN:  Acute hypercapnic respiratory failure in setting of acute renal failure and post-op narcotic/benzo use.  Temporarily required NIPPV and narcan drip.  Resolved 5/2. 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 -post-op care per ortho -okay to continue with PT  Anemia -likely related to surgery -f/u CBC  Leukocytosis likely from post-op stress and volume contraction; no obvious source of infection -f/u CBC -d/c avelox and monitor clinically  Hx of HTN -hold lisinopril/HCTZ for now due to renal issues -if blood pressure elevated would use Beta blocker for now  Hx of Depression -okay to continue celexa 20 mg daily  Best practice -Lovenox for DVT proph -SUP not indicated at this time  Disposition -can transfer to non-tele Ortho floor -PCCM will f/u  5/3   Rechel Delosreyes Pager:  [phone] 12/29/2011, 9:43 AM

## 2011-12-29 NOTE — Progress Notes (Signed)
Physical Therapy Treatment Patient Details Name: Kimberly Love MRN: [id number] DOB: [birth year] Today's Date: 12/29/2011 Time: 4098-1191 PT Time Calculation (min): 36 min  PT Assessment / Plan / Recommendation Comments on Treatment Session  Pt s/p RTKA with period of respiratory acidosis and transfer to ICU with time spent on Bipap. Pt back on nasal cannula and alert, able to follow commands and ambulate. Pt educated for precaution, plan and encouraged HEP and IS use. Will continue to follow to progress pt.     Follow Up Recommendations  Home health PT    Equipment Recommendations       Frequency     Plan Discharge plan remains appropriate;Frequency remains appropriate    Precautions / Restrictions Precautions Precautions: Knee Restrictions Weight Bearing Restrictions: Yes   Pertinent Vitals/Pain 7/10 Rknee pain, did not push ROM secondary pt to on Narcan drip and unable to receive pain meds 2L O2 throughout with sats 95%    Mobility  Bed Mobility Bed Mobility: Supine to Sit;Sitting - Scoot to Edge of Bed Supine to Sit: 3: Mod assist Sitting - Scoot to Edge of Bed: 4: Min assist Details for Bed Mobility Assistance: cueing to use LLE to assist RLE to EOB and for hand placement with sequence Transfers Transfers: Sit to Stand;Stand to Sit Sit to Stand: 1: +2 Total assist;With armrests;From bed;From chair/3-in-1 Sit to Stand: Patient Percentage: 70% Stand to Sit: 4: Min assist;With armrests;To chair/3-in-1 Details for Transfer Assistance: cueing for hand placement, RLE positioning, control of descent on second trial Ambulation/Gait Ambulation/Gait Assistance: 4: Min assist;Other (comment) (+1 follow with chair) Ambulation Distance (Feet): 30 Feet (5' to and from sink after ambulation) Assistive device: Rolling walker Ambulation/Gait Assistance Details: cueing for sequence, position in RW and posture Gait Pattern: Step-to pattern;Trunk flexed;Antalgic Gait velocity:  decreased Stairs: No    Exercises Total Joint Exercises Quad Sets: AROM;Right;10 reps;Supine Heel Slides: AAROM;Right;10 reps;Supine   PT Goals Acute Rehab PT Goals PT Goal: Supine/Side to Sit - Progress: Progressing toward goal PT Goal: Sit to Stand - Progress: Progressing toward goal PT Transfer Goal: Bed to Chair/Chair to Bed - Progress: Progressing toward goal PT Goal: Ambulate - Progress: Progressing toward goal PT Goal: Perform Home Exercise Program - Progress: Progressing toward goal  Visit Information  Last PT Received On: 12/29/11 Assistance Needed: +2 PT/OT Co-Evaluation/Treatment: Yes    Subjective Data  Subjective: i can't believe this I just came in for a knee replacement   Cognition  Overall Cognitive Status: Impaired Area of Impairment: Memory Arousal/Alertness: Awake/alert Orientation Level: Appears intact for tasks assessed Behavior During Session: [facility] for tasks performed Memory: Decreased recall of precautions Cognition - Other Comments: Pt alert but unable to recall the past few days or to recall exercises and precautions provided at beginning of tx    Balance     End of Session PT - End of Session Equipment Utilized During Treatment: Gait belt Activity Tolerance: Patient tolerated treatment well Patient left: in chair;with call bell/phone within reach Nurse Communication: Mobility status CPM Right Knee CPM Right Knee: Off    Delorse Lek 12/29/2011, 9:38 AM Delaney Meigs, PT [phone]

## 2011-12-29 NOTE — Progress Notes (Signed)
Occupational Therapy Treatment Patient Details Name: Kimberly Love MRN: [id number] DOB: 21-Jul-1951 Today's Date: 12/29/2011 Time: 4098-1191 OT Time Calculation (min): 38 min  OT Assessment / Plan / Recommendation Comments on Treatment Session Pt progressing well given respiratory distress and transfer to ICU.    Follow Up Recommendations  Home health OT;Supervision - Intermittent    Equipment Recommendations  None recommended by OT    Frequency     Plan Discharge plan needs to be updated    Precautions / Restrictions Precautions Precautions: Knee Precaution Booklet Issued: No Restrictions Weight Bearing Restrictions: Yes RLE Weight Bearing: Weight bearing as tolerated   Pertinent Vitals/Pain Pt c/o 7/10 knee pain. RN aware. Pt repositioned    ADL  Grooming: Performed;Teeth care;Supervision/safety Where Assessed - Grooming: Standing at sink Lower Body Dressing: Performed;Minimal assistance (doff socks) Where Assessed - Lower Body Dressing: Sitting, chair Toilet Transfer: Simulated;+2 Total assistance (pt=70%) Toilet Transfer Method: Ambulating Toileting - Clothing Manipulation: Minimal assistance;Performed Where Assessed - Toileting Clothing Manipulation: Standing Ambulation Related to ADLs: Min A with RW ambulation; +1 for chair behind ADL Comments: Pt transferred to ICU after respiratory issues. Would benefit from AE review next session    OT Goals ADL Goals ADL Goal: Lower Body Bathing - Progress: Progressing toward goals ADL Goal: Lower Body Dressing - Progress: Progressing toward goals ADL Goal: Toilet Transfer - Progress: Progressing toward goals ADL Goal: Toileting - Clothing Manipulation - Progress: Progressing toward goals  Visit Information  Last OT Received On: 12/29/11 Assistance Needed: +2 PT/OT Co-Evaluation/Treatment: Yes               Cognition  Overall Cognitive Status: Impaired Area of Impairment: Memory Arousal/Alertness:  Awake/alert Orientation Level: Appears intact for tasks assessed Behavior During Session: [facility] for tasks performed Memory: Decreased recall of precautions Cognition - Other Comments: Pt alert but unable to recall the past few days or to recall exercises and precautions provided at beginning of tx    Mobility Bed Mobility Bed Mobility: Supine to Sit;Sitting - Scoot to Edge of Bed Supine to Sit: 3: Mod assist Sitting - Scoot to Edge of Bed: 4: Min assist Details for Bed Mobility Assistance: cueing to use LLE to assist RLE to EOB and for hand placement with sequence Transfers Sit to Stand: 1: +2 Total assist;With armrests;From bed;From chair/3-in-1 Sit to Stand: Patient Percentage: 70% Stand to Sit: 4: Min assist;With armrests;To chair/3-in-1 Details for Transfer Assistance: cueing for hand placement, RLE positioning, control of descent on second trial   Exercises Total Joint Exercises Quad Sets: AROM;Right;10 reps;Supine Heel Slides: AAROM;Right;10 reps;Supine  Balance  Static standing balance at sink with supervision (~33min)  End of Session OT - End of Session Equipment Utilized During Treatment: Back brace Activity Tolerance: Patient tolerated treatment well Patient left: in chair;with call bell/phone within reach;with nursing in room Nurse Communication: Mobility status CPM Right Knee CPM Right Knee: Off   Mckinsley Koelzer 12/29/2011, 9:38 AM

## 2011-12-29 NOTE — Progress Notes (Signed)
Kimberly Spurling, MD   Kimberly Cabal, PA-C [address] [place], [place], [place]  [address]                             [phone]   PROGRESS NOTE  Subjective:  negative for Chest Pain  negative for Shortness of Breath  negative for Nausea/Vomiting   negative for Calf Pain  negative for Bowel Movement   Tolerating Diet: yes         Patient reports pain as 5 on 0-10 scale.    Objective: Vital signs in last 24 hours:   Patient Vitals for the past 24 hrs:  BP Temp Temp src Pulse Resp SpO2 Height Weight  12/29/11 1200 105/44 mmHg - - 85  19  96 % - -  12/29/11 1100 - - - 89  17  95 % - -  12/29/11 1000 114/58 mmHg - - 83  20  97 % - -  12/29/11 0905 - - - - - 95 % - -  12/29/11 0900 110/56 mmHg - - - - - 5\' 6"  (1.676 m) 120.1 kg (264 lb 12.4 oz)  12/29/11 0800 - - - 86  21  94 % - -  12/29/11 0700 97/72 mmHg - - 80  22  96 % - -  12/29/11 0600 102/47 mmHg - - 81  16  96 % - -  12/29/11 0500 96/51 mmHg - - 83  20  97 % - -  12/29/11 0415 109/54 mmHg - - 81  16  96 % - -  12/29/11 0400 103/45 mmHg - - 84  19  96 % - -  12/29/11 0300 103/36 mmHg - - 82  19  94 % - -  12/29/11 0200 99/51 mmHg - - 81  13  96 % - -  12/29/11 0100 102/47 mmHg - - 82  16  94 % - -  12/29/11 0000 103/49 mmHg - - 85  16  95 % - -  12/28/11 2310 98/55 mmHg - - 84  18  92 % - -  12/28/11 2300 98/55 mmHg - - 84  14  96 % - -  12/28/11 2200 96/51 mmHg - - 81  12  97 % - -  12/28/11 2140 - - - 81  14  98 % - -  12/28/11 2100 95/51 mmHg - - 79  13  99 % - -  12/28/11 2000 103/47 mmHg - - 82  12  99 % - -  12/28/11 1955 105/53 mmHg - - 87  15  99 % - -  12/28/11 1900 100/46 mmHg - - 89  19  98 % - -  12/28/11 1800 120/55 mmHg 98.4 F (36.9 C) Oral 101  18  96 % - -  12/28/11 1640 - - - - - 94 % - -  12/28/11 1638 114/45 mmHg - - 90  16  88 % - -  12/28/11 1400 124/52 mmHg 98.3 F (36.8 C) - 91  20  - - -    @flow {1959:LAST@   Intake/Output from previous day:   05/01 0701 - 05/02 0700 In: 1285  [P.O.:60; I.V.:1225] Out: 1850 [Urine:1850]   Intake/Output this shift:   05/02 0701 - 05/02 1900 In: 375 [I.V.:375] Out: 875 [Urine:875]   Intake/Output      05/01 0701 - 05/02 0700 05/02 0701 - 05/03 0700   P.O. 60  I.V. (mL/kg) 1225 375 (3.1)   Total Intake(mL/kg) 1285 375 (3.1)   Urine (mL/kg/hr) 1850 875 (1.2)   Total Output 1850 875   Net -565 -500           LABORATORY DATA:  Basename 12/29/11 0355 12/28/11 0605 12/27/11 0610  WBC 15.5* 22.4* 23.6*  HGB 9.5* 11.1* 12.4  HCT 30.1* 36.5 38.9  PLT 214 252 282    Basename 12/29/11 0355 12/28/11 2151 12/28/11 0934 12/28/11 0605 12/27/11 0610  NA 137 134* 134* 135 137  K 4.3 4.5 4.9 5.5* 4.5  CL 102 100 100 100 100  CO2 27 28 28  32 28  BUN 17 20 17 16 8   CREATININE 0.67 0.91 1.53* 1.75* 0.49*  GLUCOSE 139* 131* 138* 133* 144*  CALCIUM 8.5 8.3* 8.4 8.2* 8.5   Lab Results  Component Value Date   INR 0.92 12/19/2011    Examination:  General appearance: alert, cooperative and no distress Resp: diminished breath sounds LLL Extremities: Homans sign is negative, no sign of DVT  Wound Exam: clean, dry, intact   Drainage:  None: wound tissue dry  Motor Exam: EHL and FHL Intact  Sensory Exam: Deep Peroneal normal  Vascular Exam:    Assessment:    3 Days Post-Op  Procedure(s) (LRB): TOTAL KNEE ARTHROPLASTY (Right)  ADDITIONAL DIAGNOSIS:  Active Problems:  Respiratory acidosis  Acute respiratory failure  Altered mental status  Hypoxemia  Acute Blood Loss Anemia   Plan: Physical Therapy as ordered Weight Bearing as Tolerated (WBAT)  DVT Prophylaxis:  Lovenox  DISCHARGE PLAN: Home  DISCHARGE NEEDS: HHPT, CPM, Walker and 3-in-1 comode seat  Anticipate D/C tomorrow if cleared by medicine         Kimberly Love 12/29/2011, 12:51 PM

## 2011-12-29 NOTE — Progress Notes (Signed)
Subjective: Pt is more awake and alert, responding to questions appropriately. She doesn't remember the events yesterday. Her pain is better controlled with tylenol.   Objective: Weight change:   Intake/Output Summary (Last 24 hours) at 12/29/11 1536 Last data filed at 12/29/11 1200  Gross per 24 hour  Intake   1600 ml  Output   2475 ml  Net   -875 ml    Filed Vitals:   12/29/11 1230  BP: 107/71  Pulse: 83  Temp: 98.3 F (36.8 C)  Resp: 18   On Exam  She is alert Afebrile Comfortable CVS s1s2 heard Lungs Good air entry bilateral  Abdomen: soft non tender non distended bowel sounds heard Extremities: Right leg wrapped.  Neuro: alert oriented 3, no focal deficits.   Lab Results: Results for orders placed during the hospital encounter of 12/26/11 (from the past 24 hour(s))  BLOOD GAS, ARTERIAL     Status: Abnormal   Collection Time   12/28/11  4:52 PM      Component Value Range   O2 Content 4.0     Delivery systems NASAL CANNULA     pH, Arterial 7.180 (*) 7.350 - 7.400    pCO2 arterial 76.6 (*) 35.0 - 45.0 (mmHg)   pO2, Arterial 69.1 (*) 80.0 - 100.0 (mmHg)   Bicarbonate 27.5 (*) 20.0 - 24.0 (mEq/L)   TCO2 29.8  0 - 100 (mmol/L)   Acid-base deficit 0.1  0.0 - 2.0 (mmol/L)   O2 Saturation 94.0     Patient temperature 98.6     Collection site LEFT RADIAL     Drawn by [id number]     Sample type ARTERIAL DRAW     Allens test (pass/fail) PASS  PASS   CARDIAC PANEL(CRET KIN+CKTOT+MB+TROPI)     Status: Abnormal   Collection Time   12/28/11  7:56 PM      Component Value Range   Total CK 1098 (*) 7 - 177 (U/L)   CK, MB 26.4 (*) 0.3 - 4.0 (ng/mL)   Troponin I <0.30  <0.30 (ng/mL)   Relative Index 2.4  0.0 - 2.5   POCT I-STAT 3, BLOOD GAS (G3+)     Status: Abnormal   Collection Time   12/28/11  9:34 PM      Component Value Range   pH, Arterial 7.279 (*) 7.350 - 7.400    pCO2 arterial 60.8 (*) 35.0 - 45.0 (mmHg)   pO2, Arterial 118.0 (*) 80.0 - 100.0 (mmHg)   Bicarbonate 28.6  (*) 20.0 - 24.0 (mEq/L)   TCO2 30  0 - 100 (mmol/L)   O2 Saturation 98.0     Acid-Base Excess 1.0  0.0 - 2.0 (mmol/L)   Patient temperature 97.9 F     Collection site RADIAL, ALLEN'S TEST ACCEPTABLE     Drawn by RT     Sample type ARTERIAL     Comment NOTIFIED PHYSICIAN    BASIC METABOLIC PANEL     Status: Abnormal   Collection Time   12/28/11  9:51 PM      Component Value Range   Sodium 134 (*) 135 - 145 (mEq/L)   Potassium 4.5  3.5 - 5.1 (mEq/L)   Chloride 100  96 - 112 (mEq/L)   CO2 28  19 - 32 (mEq/L)   Glucose, Bld 131 (*) 70 - 99 (mg/dL)   BUN 20  6 - 23 (mg/dL)   Creatinine, Ser 0.45  0.50 - 1.10 (mg/dL)   Calcium 8.3 (*) 8.4 -  10.5 (mg/dL)   GFR calc non Af Amer 67 (*) >90 (mL/min)   GFR calc Af Amer 78 (*) >90 (mL/min)  CARDIAC PANEL(CRET KIN+CKTOT+MB+TROPI)     Status: Abnormal   Collection Time   12/29/11  3:55 AM      Component Value Range   Total CK 922 (*) 7 - 177 (U/L)   CK, MB 19.0 (*) 0.3 - 4.0 (ng/mL)   Troponin I <0.30  <0.30 (ng/mL)   Relative Index 2.1  0.0 - 2.5   CBC     Status: Abnormal   Collection Time   12/29/11  3:55 AM      Component Value Range   WBC 15.5 (*) 4.0 - 10.5 (K/uL)   RBC 3.29 (*) 3.87 - 5.11 (MIL/uL)   Hemoglobin 9.5 (*) 12.0 - 15.0 (g/dL)   HCT 40.9 (*) 81.1 - 46.0 (%)   MCV 91.5  78.0 - 100.0 (fL)   MCH 28.9  26.0 - 34.0 (pg)   MCHC 31.6  30.0 - 36.0 (g/dL)   RDW 91.4  78.2 - 95.6 (%)   Platelets 214  150 - 400 (K/uL)  BASIC METABOLIC PANEL     Status: Abnormal   Collection Time   12/29/11  3:55 AM      Component Value Range   Sodium 137  135 - 145 (mEq/L)   Potassium 4.3  3.5 - 5.1 (mEq/L)   Chloride 102  96 - 112 (mEq/L)   CO2 27  19 - 32 (mEq/L)   Glucose, Bld 139 (*) 70 - 99 (mg/dL)   BUN 17  6 - 23 (mg/dL)   Creatinine, Ser 2.13  0.50 - 1.10 (mg/dL)   Calcium 8.5  8.4 - 08.6 (mg/dL)   GFR calc non Af Amer >90  >90 (mL/min)   GFR calc Af Amer >90  >90 (mL/min)  MAGNESIUM     Status: Normal   Collection Time   12/29/11   3:55 AM      Component Value Range   Magnesium 2.0  1.5 - 2.5 (mg/dL)  PHOSPHORUS     Status: Abnormal   Collection Time   12/29/11  3:55 AM      Component Value Range   Phosphorus 1.7 (*) 2.3 - 4.6 (mg/dL)     Micro Results: Recent Results (from the past 240 hour(s))  URINE CULTURE     Status: Normal   Collection Time   12/28/11  9:47 AM      Component Value Range Status Comment   Specimen Description URINE, CATHETERIZED   Final    Special Requests ADDED AT 1153   Final    Culture  Setup Time [id number]   Final    Colony Count NO GROWTH   Final    Culture NO GROWTH   Final    Report Status 12/29/2011 FINAL   Final     Studies/Results: Dg Chest 2 View  12/28/2011  *RADIOLOGY REPORT*  Clinical Data: Hypoxemia, lethargy, shortness of breath, history seizures, hypertension  CHEST - 2 VIEW  Comparison: 12/19/2011  Findings: Enlargement of cardiac silhouette with pulmonary vascular congestion. Question minimal perihilar infiltrate versus edema. Minimal basilar atelectasis. No definite pleural effusion or pneumothorax. Elongation thoracic aorta. Multilevel endplate spur formation thoracic spine. Prominent air fluid level within stomach.  IMPRESSION: Enlargement of cardiac silhouette with pulmonary vascular congestion. Question mild perihilar edema with bibasilar atelectasis.  Original Report Authenticated By: Lollie Marrow, M.D.   Dg Chest 2 View  12/19/2011  *  RADIOLOGY REPORT*  Clinical Data: Preoperative respiratory evaluation for knee replacement.  CHEST - 2 VIEW  Comparison: None.  Findings: The lungs are clear without focal infiltrate, edema, pneumothorax or pleural effusion. Cardiopericardial silhouette is at upper limits of normal for size. Imaged bony structures of the thorax are intact.  IMPRESSION: No acute cardiopulmonary findings.  Original Report Authenticated By: ERIC A. MANSELL, M.D.   Ct Head Wo Contrast  12/28/2011  *RADIOLOGY REPORT*  Clinical Data: 61 year old female with  weakness, lethargy.  CT HEAD WITHOUT CONTRAST  Technique:  Contiguous axial images were obtained from the base of the skull through the vertex without contrast.  Comparison: None.  Findings: Visualized orbits and scalp soft tissues are within normal limits.  Visualized paranasal sinuses and mastoids are clear.  No acute osseous abnormality identified.  Cerebral volume is within normal limits for age.  No midline shift, ventriculomegaly, mass effect, evidence of mass lesion, intracranial hemorrhage or evidence of cortically based acute infarction.  Gray-white matter differentiation is within normal limits throughout the brain.  No suspicious intracranial vascular hyperdensity.  IMPRESSION: Normal noncontrast CT appearance of the brain.  Original Report Authenticated By: Ulla Potash III, M.D.   Nm Pulmonary Perfusion  12/28/2011  *RADIOLOGY REPORT*  Clinical Data:   recent knee surgery.  Short of breath.  Concern pulmonary embolism.  NUCLEAR MEDICINE VENTILATION - PERFUSION LUNG SCAN  Technique:    Perfusion images were obtained in multiple projections after intravenous injection of Tc-37m MAA.  Radiopharmaceuticals: 3.0 mCi Tc-62m MAA.  Comparison: Chest radiograph 12/28/2011  Findings: .  Perfusion:  No wedge shaped peripheral perfusion defects to suggest acute pulmonary embolism  IMPRESSION:  Very low probability for acute pulmonary embolism.  Original Report Authenticated By: Genevive Bi, M.D.   US Renal Port  12/29/2011  *RADIOLOGY REPORT*  Clinical Data: Rule out hydronephrosis.  Elevated BUN and creatinine.  RENAL/URINARY TRACT ULTRASOUND COMPLETE  Comparison:  None  Findings:  Right Kidney:  12.1 cm. No hydronephrosis.  Normal renal cortical thickness and echogenicity.  Left Kidney:  13.2 cm. No hydronephrosis.  Normal renal cortical thickness and echogenicity.  Bladder:  Foley catheter within urinary bladder.  The bladder is not entirely collapsed.  Example image 12.  IMPRESSION:  1.  No urinary tract  obstruction or explanation for elevated BUN/creatinine. 2.  Foley catheter within urinary bladder.  The bladder is incompletely collapsed.  Correlate with catheter function.  Original Report Authenticated By: Consuello Bossier, M.D.   Dg Chest Port 1 View  12/29/2011  *RADIOLOGY REPORT*  Clinical Data: Respiratory failure, shortness of breath  PORTABLE CHEST - 1 VIEW  Comparison: Chest x-ray of 12/28/2011  Findings: The lungs appear better aerated.  No definite pulmonary vascular congestion is seen.  Cardiomegaly is stable.  No effusion is noted.  There are degenerative changes in the thoracic spine.  IMPRESSION: Stable cardiomegaly.  Slightly better aeration.  No definite pulmonary vascular congestion.  Original Report Authenticated By: Juline Patch, M.D.   Medications: Scheduled Meds:   . celecoxib  200 mg Oral BID  . citalopram  20 mg Oral Daily  . docusate sodium  100 mg Oral BID  . enoxaparin  30 mg Subcutaneous Q12H  . naloxone (NARCAN) injection  0.4 mg Intramuscular Once  . DISCONTD: hydrochlorothiazide  25 mg Oral Daily  . DISCONTD: lisinopril  20 mg Oral Daily  . DISCONTD: moxifloxacin  400 mg Intravenous Q24H  . DISCONTD: naloxone  0.4 mg Intramuscular Once   Continuous  Infusions:   . sodium chloride 50 mL/hr (12/28/11 1808)  . bupivacaine ON-Q pain pump    . DISCONTD: sodium chloride 100 mL/hr at 12/28/11 1119  . DISCONTD: naloxone 0.25 mg/hr (12/29/11 0517)   PRN Meds:.acetaminophen, alum & mag hydroxide-simeth, bisacodyl, menthol-cetylpyridinium, methocarbamol (ROBAXIN) IV, methocarbamol, naloxone (NARCAN) injection, ondansetron (ZOFRAN) IV, phenol, senna-docusate, traMADol, DISCONTD: acetaminophen, DISCONTD: diphenhydrAMINE, DISCONTD: HYDROcodone-acetaminophen, DISCONTD: HYDROmorphone, DISCONTD: metoCLOPramide (REGLAN) injection, DISCONTD: metoCLOPramide, DISCONTD: ondansetron, DISCONTD: zolpidem  Assessment/Plan: Patient Active Hospital Problem List:  Respiratory acidosis  (12/28/2011) Appears to have resolved.   Acute Hypercapneic respiratory failure (12/28/2011): probably from the excess narcotic pain medications. Resolved with Bipap use.  Currently she is on 2 liters of nasal oxygen with good oxygen saturations. Wean her off oxygen Avoid narcotic pain medications on discharge.   Altered mental status (12/28/2011) Secondary to metabolic encephalopathy . Resolved.  Pt is alert and oriented.   Leukocytosis: improved. Repeat CXR does not show pneumonia and Urine culture is negative. Antibiotics were stopped yesterday. Probably a stress reaction and dehydration. Check CBC in one week.   Anemia : probably from the intraoperative blood loss. Continue to monitor.   Acute Renal Failure:  probably  Pre renal. Resolved with IV fluids.  Right TKR: continue with PT.   HTN: controlled.   Depression : continue with celexa. Will continue to monitor.    LOS: 3 days   Lavelle Berland 12/29/2011, 3:36 PM

## 2011-12-30 DIAGNOSIS — R5381 Other malaise: Secondary | ICD-10-CM

## 2011-12-30 DIAGNOSIS — E872 Acidosis: Secondary | ICD-10-CM

## 2011-12-30 DIAGNOSIS — R5383 Other fatigue: Secondary | ICD-10-CM

## 2011-12-30 LAB — BASIC METABOLIC PANEL
CO2: 32 mEq/L (ref 19–32)
Chloride: 105 mEq/L (ref 96–112)
GFR calc Af Amer: >90 mL/min (ref >90)
Potassium: 3.8 mEq/L (ref 3.5–5.1)

## 2011-12-30 LAB — CBC
HCT: 29.7 % — ABNORMAL LOW (ref 36.0–46.0)
Hemoglobin: 9.5 g/dL — ABNORMAL LOW (ref 12.0–15.0)
RBC: 3.3 MIL/uL — ABNORMAL LOW (ref 3.87–5.11)
WBC: 10.3 10*3/uL (ref 4.0–10.5)

## 2011-12-30 MED ORDER — ENOXAPARIN SODIUM 40 MG/0.4ML ~~LOC~~ SOLN: 40.0000 mg | Freq: Two times a day (BID) | SUBCUTANEOUS | Status: DC

## 2011-12-30 MED ORDER — TRAMADOL HCL 50 MG PO TABS: 50.0000 mg | ORAL_TABLET | ORAL | Status: AC | PRN

## 2011-12-30 MED ORDER — METHOCARBAMOL 500 MG PO TABS: 500.0000 mg | ORAL_TABLET | Freq: Four times a day (QID) | ORAL | Status: AC | PRN

## 2011-12-30 NOTE — Discharge Instructions (Signed)
Diet: As you were doing prior to hospitalization   Activity:  Increase activity slowly as tolerated                  No lifting or driving for 6 weeks  Shower:  May shower without a dressing once there is no drainage from your wound.                 Do NOT wash over the wound.  If drainage remains, cover wound with saran                  Wrap and then shower.  Clean incision with betadine and change dressing                        After saran wrap removed.  Dressing:  You may change your dressing on Friday                    Then change the dressing daily with sterile 4"x4"s gauze dressing                     And TED hose for knees.  Use paper tape to hold dressing in place                     For hips.  You may clean the incision with alcohol prior to redressing.  Weight Bearing:  Weight bearing as tolerated as taught in physical therapy.  Use a                                walker or Crutches as instructed.  To prevent constipation: you may use a stool softener such as -               Colace ( over the counter) 100 mg by mouth twice a day                Drink plenty of fluids ( prune juice may be helpful) and high fiber foods                Miralax ( over the counter) for constipation as needed.    Precautions:  If you experience chest pain or shortness of breath - call 911 immediately               For transfer to the hospital emergency department!!               If you develop a fever greater that 101 F, purulent drainage from wound,                             increased redness or drainage from wound, or calf pain -- Call the office at                                                 [phone].  Follow- Up Appointment:  Please call for an appointment to be seen on 01/10/12  [place] - [phone]                   

## 2011-12-30 NOTE — Progress Notes (Signed)
Kimberly Love is a 61 y.o. female smoker admitted on 12/26/2011 with right knee pain for right knee arthroplasty.  She was receiving benzo's, narcotics post-op.  She was noted to be lethargic and short of breath 5/1 and found to have acute renal failure and hypercapnic respiratory failure requiring BPAP.  PCCM consulted 5/1.  Cultures: Urine 5/1>>neg  Antibiotics: Cefazolin 4/29>> Avelox 5/1>>5/2  Tests/events: 5/1 VQ>>>very low probability for PE 5/1 CT HEAD>>>negative 5/1 Echo>>mild LVH, EF 60 to 65%, grade 1 diastolic dysfx, mild LA dilation 5/1 Lower ext doppler>>no DVT 5/2 Renal u/s>>normal study 5/3 NmL mental status   SUBJECTIVE: Denies chest pain, dyspnea, abd pain.  Moderate leg pain.   OBJECTIVE:  Blood pressure 128/75, pulse 75, temperature 97.8 F (36.6 C), temperature source Oral, resp. rate 18, height 5\' 6"  (1.676 m), weight 264 lb 12.4 oz (120.1 kg), SpO2 94.00%. Wt Readings from Last 3 Encounters:  12/29/11 264 lb 12.4 oz (120.1 kg)  12/29/11 264 lb 12.4 oz (120.1 kg)  12/19/11 251 lb (113.853 kg)   Body mass index is 42.74 kg/(m^2).  I/O last 3 completed shifts: In: 1600 [I.V.:1600] Out: 3975 [Urine:3975]  Physical Exam: General - no distress, sitting in chair HEENT - no sinus tenderness, no oral exudate Cardiac - s1s2 regular Chest - no wheeze/rales Abd - soft, nontender, + bowel sounds Ext - compression stockings in place, R Knee bandage c/d/i Neuro - A&O x 3, normal strength Psych - normal mood, behavior  CBC    Component Value Date/Time   WBC 10.3 12/30/2011 0620   RBC 3.30* 12/30/2011 0620   HGB 9.5* 12/30/2011 0620   HCT 29.7* 12/30/2011 0620   PLT 236 12/30/2011 0620   MCV 90.0 12/30/2011 0620   MCH 28.8 12/30/2011 0620   MCHC 32.0 12/30/2011 0620   RDW 14.7 12/30/2011 0620   LYMPHSABS 3.0 12/19/2011 0933   MONOABS 0.8 12/19/2011 0933   EOSABS 0.3 12/19/2011 0933   BASOSABS 0.0 12/19/2011 0933    BMET    Component Value Date/Time   NA 141  12/30/2011 0620   K 3.8 12/30/2011 0620   CL 105 12/30/2011 0620   CO2 32 12/30/2011 0620   GLUCOSE 108* 12/30/2011 0620   BUN 13 12/30/2011 0620   CREATININE 0.50 12/30/2011 0620   CALCIUM 8.8 12/30/2011 0620   GFRNONAA >90 12/30/2011 0620   GFRAA >90 12/30/2011 0620   Lab Results  Component Value Date   ALT 73* 12/28/2011   AST 54* 12/28/2011   ALKPHOS 83 12/28/2011   BILITOT 0.2* 12/28/2011   ABG    Component Value Date/Time   PHART 7.279* 12/28/2011 2134   PCO2ART 60.8* 12/28/2011 2134   PO2ART 118.0* 12/28/2011 2134   HCO3 28.6* 12/28/2011 2134   TCO2 30 12/28/2011 2134   ACIDBASEDEF 0.1 12/28/2011 1652   O2SAT 98.0 12/28/2011 2134    Ct Head Wo Contrast  12/28/2011  *RADIOLOGY REPORT*  Clinical Data: 60 year old female with weakness, lethargy.  CT HEAD WITHOUT CONTRAST  Technique:  Contiguous axial images were obtained from the base of the skull through the vertex without contrast.  Comparison: None.  Findings: Visualized orbits and scalp soft tissues are within normal limits.  Visualized paranasal sinuses and mastoids are clear.  No acute osseous abnormality identified.  Cerebral volume is within normal limits for age.  No midline shift, ventriculomegaly, mass effect, evidence of mass lesion, intracranial hemorrhage or evidence of cortically based acute infarction.  Gray-white matter differentiation is within normal limits  throughout the brain.  No suspicious intracranial vascular hyperdensity.  IMPRESSION: Normal noncontrast CT appearance of the brain.  Original Report Authenticated By: Ulla Potash III, M.D.   Nm Pulmonary Perfusion  12/28/2011  *RADIOLOGY REPORT*  Clinical Data:   recent knee surgery.  Short of breath.  Concern pulmonary embolism.  NUCLEAR MEDICINE VENTILATION - PERFUSION LUNG SCAN  Technique:    Perfusion images were obtained in multiple projections after intravenous injection of Tc-64m MAA.  Radiopharmaceuticals: 3.0 mCi Tc-43m MAA.  Comparison: Chest radiograph 12/28/2011  Findings: .  Perfusion:   No wedge shaped peripheral perfusion defects to suggest acute pulmonary embolism  IMPRESSION:  Very low probability for acute pulmonary embolism.  Original Report Authenticated By: Genevive Bi, M.D.   US Renal Port  12/29/2011  *RADIOLOGY REPORT*  Clinical Data: Rule out hydronephrosis.  Elevated BUN and creatinine.  RENAL/URINARY TRACT ULTRASOUND COMPLETE  Comparison:  None  Findings:  Right Kidney:  12.1 cm. No hydronephrosis.  Normal renal cortical thickness and echogenicity.  Left Kidney:  13.2 cm. No hydronephrosis.  Normal renal cortical thickness and echogenicity.  Bladder:  Foley catheter within urinary bladder.  The bladder is not entirely collapsed.  Example image 12.  IMPRESSION:  1.  No urinary tract obstruction or explanation for elevated BUN/creatinine. 2.  Foley catheter within urinary bladder.  The bladder is incompletely collapsed.  Correlate with catheter function.  Original Report Authenticated By: Consuello Bossier, M.D.   Dg Chest Port 1 View  12/29/2011  *RADIOLOGY REPORT*  Clinical Data: Respiratory failure, shortness of breath  PORTABLE CHEST - 1 VIEW  Comparison: Chest x-ray of 12/28/2011  Findings: The lungs appear better aerated.  No definite pulmonary vascular congestion is seen.  Cardiomegaly is stable.  No effusion is noted.  There are degenerative changes in the thoracic spine.  IMPRESSION: Stable cardiomegaly.  Slightly better aeration.  No definite pulmonary vascular congestion.  Original Report Authenticated By: Juline Patch, M.D.    ASSESSMENT/PLAN:  Acute hypercapnic respiratory failure in setting of acute renal failure and post-op narcotic/benzo use.  Temporarily required NIPPV and narcan drip.  Resolved 5/2. 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 -post-op care per ortho -okay to continue with PT -ensure stool softener with any narcotic use  Anemia -likely related to surgery -f/u CBC  Leukocytosis likely from post-op stress and volume contraction; no obvious source of infection -f/u CBC -off abx and monitor clinically  Hx of HTN -hold lisinopril/HCTZ for now due to renal issues -if blood pressure elevated would use Beta blocker for now -will need to follow up with PCP in regards to BP regimen  Hx of Depression -okay to continue celexa 20 mg daily  Best practice -Lovenox for DVT proph -SUP not indicated at this time  Disposition -ok for discharge from Pulmonary/Critical Care perspective.  PCCM will sign off.  See above notes regarding HTN regimen.    Canary Brim, NP-C [facility] Pgr: [phone]    Seen and database reviewed with ACNP Oll[place]. Agree with above  Billy Fischer, MD;  PCCM service; Mobile [phone]

## 2011-12-30 NOTE — Progress Notes (Signed)
Physical Therapy Treatment Patient Details Name: Kimberly Love MRN: [id number] DOB: [birth year] Today's Date: 12/30/2011 Time: 4098-1191 PT Time Calculation (min): 22 min  PT Assessment / Plan / Recommendation Comments on Treatment Session  Pt continues with limited participation with PT.  Declined ambulation and agreeable to limited ther ex.  Husband present and verbally reviewed stair instruction with him.    Follow Up Recommendations  Home health PT    Equipment Recommendations  None recommended by OT    Frequency 7X/week   Plan Discharge plan remains appropriate;Frequency remains appropriate    Precautions / Restrictions Precautions Precautions: Knee Restrictions Weight Bearing Restrictions: Yes RLE Weight Bearing: Weight bearing as tolerated   Pertinent Vitals/Pain No c/o pain even with knee flexion.       Exercises Total Joint Exercises Ankle Circles/Pumps: AROM;20 reps;Both;Supine Quad Sets: AROM;Right;10 reps;Supine Short Arc Quad: AROM;Right;10 reps;Supine Straight Leg Raises: AROM;Right;10 reps;Supine Long Arc Quad: AAROM;5 reps;Right;Seated Knee Flexion: AAROM;Right;10 reps;Seated   PT Goals Acute Rehab PT Goals Time For Goal Achievement: 01/03/12 Potential to Achieve Goals: Good PT Goal: Supine/Side to Sit - Progress: Progressing toward goal PT Goal: Sit to Stand - Progress: Progressing toward goal PT Transfer Goal: Bed to Chair/Chair to Bed - Progress: Progressing toward goal PT Goal: Ambulate - Progress: Progressing toward goal PT Goal: Perform Home Exercise Program - Progress: Progressing toward goal  Visit Information  Last PT Received On: 12/30/11 Assistance Needed: +1    Subjective Data  Subjective: "I did what you asked this morning.  I'm not doing anymore."   Cognition  Overall Cognitive Status: Appears within functional limits for tasks assessed/performed Arousal/Alertness: Awake/alert Orientation Level: Appears intact for tasks  assessed Behavior During Session: [facility] for tasks performed    Balance  Balance Balance Assessed: No  End of Session PT - End of Session Equipment Utilized During Treatment: Gait belt Activity Tolerance: Other (comment) (Self limiting) Patient left: in chair;with call bell/phone within reach;with family/visitor present Nurse Communication: Mobility status    Kimberly Love 12/30/2011, 2:08 PM  Kimberly Love, PTA Acute Rehab [phone] (office)

## 2011-12-30 NOTE — Progress Notes (Signed)
Georgena Spurling, MD   Altamese Cabal, PA-C [address] [place], [place], [place]  [address]                             [phone]   PROGRESS NOTE  Subjective:  negative for Chest Pain  negative for Shortness of Breath  negative for Nausea/Vomiting   negative for Calf Pain  negative for Bowel Movement   Tolerating Diet: yes         Patient reports pain as 4 on 0-10 scale.    Objective: Vital signs in last 24 hours:   Patient Vitals for the past 24 hrs:  BP Temp Temp src Pulse Resp SpO2 Height Weight  12/30/11 0556 128/75 mmHg 97.8 F (36.6 C) Oral 75  18  94 % - -  12/29/11 2156 116/70 mmHg 97.9 F (36.6 C) Oral 79  18  92 % - -  12/29/11 1818 122/67 mmHg 98.1 F (36.7 C) Oral 85  18  90 % - -  12/29/11 1230 107/71 mmHg 98.3 F (36.8 C) Oral 83  18  92 % - -  12/29/11 1200 105/44 mmHg - - 85  19  96 % - -  12/29/11 1100 - - - 89  17  95 % - -  12/29/11 1000 114/58 mmHg - - 83  20  97 % - -  12/29/11 0905 - - - - - 95 % - -  12/29/11 0900 110/56 mmHg - - - - - 5\' 6"  (1.676 m) 120.1 kg (264 lb 12.4 oz)  12/29/11 0800 - - - 86  21  94 % - -    @flow {1959:LAST@   Intake/Output from previous day:   05/02 0701 - 05/03 0700 In: 375 [I.V.:375] Out: 2575 [Urine:2575]   Intake/Output this shift:       Intake/Output      05/02 0701 - 05/03 0700 05/03 0701 - 05/04 0700   P.O.     I.V. (mL/kg) 375 (3.1)    Total Intake(mL/kg) 375 (3.1)    Urine (mL/kg/hr) 2575 (0.9)    Total Output 2575    Net -2200            LABORATORY DATA:  Basename 12/30/11 0620 12/29/11 0355 12/28/11 0605 12/27/11 0610  WBC 10.3 15.5* 22.4* 23.6*  HGB 9.5* 9.5* 11.1* 12.4  HCT 29.7* 30.1* 36.5 38.9  PLT 236 214 252 282    Basename 12/30/11 0620 12/29/11 0355 12/28/11 2151 12/28/11 0934 12/28/11 0605 12/27/11 0610  NA 141 137 134* 134* 135 137  K 3.8 4.3 4.5 4.9 5.5* 4.5  CL 105 102 100 100 100 100  CO2 32 27 28 28  32 28  BUN 13 17 20 17 16 8   CREATININE 0.50 0.67 0.91 1.53* 1.75*  0.49*  GLUCOSE 108* 139* 131* 138* 133* 144*  CALCIUM 8.8 8.5 8.3* 8.4 8.2* 8.5   Lab Results  Component Value Date   INR 0.92 12/19/2011    Examination:  General appearance: alert, cooperative and no distress Extremities: Homans sign is negative, no sign of DVT  Wound Exam: clean, dry, intact   Drainage:  None: wound tissue dry  Motor Exam: EHL and FHL Intact  Sensory Exam: Deep Peroneal normal  Vascular Exam:    Assessment:    4 Days Post-Op  Procedure(s) (LRB): TOTAL KNEE ARTHROPLASTY (Right)  ADDITIONAL DIAGNOSIS:  Active Problems:  Respiratory acidosis  Acute respiratory failure  Altered  mental status  Hypoxemia  Acute Blood Loss Anemia   Plan: Physical Therapy as ordered Weight Bearing as Tolerated (WBAT)  DVT Prophylaxis:  Lovenox  DISCHARGE PLAN: Home  DISCHARGE NEEDS: HHPT, CPM, Walker and 3-in-1 comode seat         Amyr Sluder 12/30/2011, 7:54 AM

## 2011-12-30 NOTE — Discharge Summary (Signed)
Kimberly Campbell, MD   Kimberly Code, PA-C [address] [place], [place], [place]  [address]                             [phone]  PATIENT ID: Kimberly Love        MRN:  [id number]          DOB/AGE: [birth year] / 61 y.o.    DISCHARGE SUMMARY  ADMISSION DATE:    12/26/2011 DISCHARGE DATE:   12/30/2011   ADMISSION DIAGNOSIS: OSTEOARTHRITIS RIGHT SIDE    DISCHARGE DIAGNOSIS:  OSTEOARTHRITIS RIGHT SIDE    ADDITIONAL DIAGNOSIS: Active Problems:  Respiratory acidosis  Acute respiratory failure  Altered mental status  Hypoxemia  Past Medical History  Diagnosis Date  . Complication of anesthesia     has seizure in 1973  while waking up  . Seizures     x1  . Anxiety   . Depression   . Hypertension     dr Timothy Lasso   [facility]  [place]  . PONV (postoperative nausea and vomiting)     PROCEDURE: Procedure(s): TOTAL KNEE ARTHROPLASTY on 12/26/2011  CONSULTS: Treatment Team:  Loel Ro, MD Md Pccm, MD   HISTORY:  See H&P in chart  HOSPITAL COURSE:  Kimberly Love is a 61 y.o. admitted on 12/26/2011 and found to have a diagnosis of OSTEOARTHRITIS RIGHT SIDE.  After appropriate laboratory studies were obtained  they were taken to the operating room on 12/26/2011 and underwent Procedure(s): TOTAL KNEE ARTHROPLASTY.   They were given perioperative antibiotics:  Anti-infectives     Start     Dose/Rate Route Frequency Ordered Stop   12/28/11 1200   moxifloxacin (AVELOX) IVPB 400 mg  Status:  Discontinued        400 mg 250 mL/hr over 60 Minutes Intravenous Every 24 hours 12/28/11 1127 12/29/11 1021   12/26/11 1500   ceFAZolin (ANCEF) IVPB 2 g/50 mL premix        2 g 100 mL/hr over 30 Minutes Intravenous Every 6 hours 12/26/11 1250 12/27/11 0406   12/25/11 1054   ceFAZolin (ANCEF) IVPB 2 g/50 mL premix        2 g 100 mL/hr over 30 Minutes Intravenous 60 min pre-op 12/25/11 1054 12/26/11 0905        .  Tolerated the procedure well.  Placed with a foley  intraoperatively.  Given Ofirmev at induction and for 48 hours.    POD #1, allowed out of bed to a chair.  PT for ambulation and exercise program.  Foley D/C'd in morning.  IV saline locked.  O2 contionued. LethargiC AMS  POD #2, continued PT and ambulation.   Hemovac pulled. Lethargic AMS continued moved to telemetry bed  POD#3 Respiratory Acidosis, Acute renal failure resolving .  The remainder of the hospital course was dedicated to ambulation and strengthening.   The patient was discharged on 4 Days Post-Op in  Good condition.  Blood products given:none  DIAGNOSTIC STUDIES: Recent vital signs: Patient Vitals for the past 24 hrs:  BP Temp Temp src Pulse Resp SpO2 Height Weight  12/30/11 0556 128/75 mmHg 97.8 F (36.6 C) Oral 75  18  94 % - -  12/29/11 2156 116/70 mmHg 97.9 F (36.6 C) Oral 79  18  92 % - -  12/29/11 1818 122/67 mmHg 98.1 F (36.7 C) Oral 85  18  90 % - -  12/29/11 1230 107/71 mmHg  98.3 F (36.8 C) Oral 83  18  92 % - -  07-Jan-2012 1200 105/44 mmHg - - 85  19  96 % - -  01/07/2012 1100 - - - 89  17  95 % - -  01-07-12 1000 114/58 mmHg - - 83  20  97 % - -  01-07-2012 0905 - - - - - 95 % - -  Jan 07, 2012 0900 110/56 mmHg - - - - - 5\' 6"  (1.676 m) 120.1 kg (264 lb 12.4 oz)       Recent laboratory studies:  Basename 12/30/11 0620 Jan 07, 2012 0355 12/28/11 0605 12/27/11 0610  WBC 10.3 15.5* 22.4* 23.6*  HGB 9.5* 9.5* 11.1* 12.4  HCT 29.7* 30.1* 36.5 38.9  PLT 236 214 252 282    Basename 12/30/11 0620 01/07/12 0355 12/28/11 2151 12/28/11 0934 12/28/11 0605 12/27/11 0610  NA 141 137 134* 134* 135 137  K 3.8 4.3 4.5 4.9 5.5* 4.5  CL 105 102 100 100 100 100  CO2 32 27 28 28  32 28  BUN 13 17 20 17 16 8   CREATININE 0.50 0.67 0.91 1.53* 1.75* 0.49*  GLUCOSE 108* 139* 131* 138* 133* 144*  CALCIUM 8.8 8.5 8.3* 8.4 8.2* 8.5   Lab Results  Component Value Date   INR 0.92 12/19/2011     Recent Radiographic Studies :  Dg Chest 2 View  12/28/2011  *RADIOLOGY REPORT*   Clinical Data: Hypoxemia, lethargy, shortness of breath, history seizures, hypertension  CHEST - 2 VIEW  Comparison: 12/19/2011  Findings: Enlargement of cardiac silhouette with pulmonary vascular congestion. Question minimal perihilar infiltrate versus edema. Minimal basilar atelectasis. No definite pleural effusion or pneumothorax. Elongation thoracic aorta. Multilevel endplate spur formation thoracic spine. Prominent air fluid level within stomach.  IMPRESSION: Enlargement of cardiac silhouette with pulmonary vascular congestion. Question mild perihilar edema with bibasilar atelectasis.  Original Report Authenticated By: Lollie Marrow, M.D.   Dg Chest 2 View  12/19/2011  *RADIOLOGY REPORT*  Clinical Data: Preoperative respiratory evaluation for knee replacement.  CHEST - 2 VIEW  Comparison: None.  Findings: The lungs are clear without focal infiltrate, edema, pneumothorax or pleural effusion. Cardiopericardial silhouette is at upper limits of normal for size. Imaged bony structures of the thorax are intact.  IMPRESSION: No acute cardiopulmonary findings.  Original Report Authenticated By: ERIC A. MANSELL, M.D.   Ct Head Wo Contrast  12/28/2011  *RADIOLOGY REPORT*  Clinical Data: 61 year old female with weakness, lethargy.  CT HEAD WITHOUT CONTRAST  Technique:  Contiguous axial images were obtained from the base of the skull through the vertex without contrast.  Comparison: None.  Findings: Visualized orbits and scalp soft tissues are within normal limits.  Visualized paranasal sinuses and mastoids are clear.  No acute osseous abnormality identified.  Cerebral volume is within normal limits for age.  No midline shift, ventriculomegaly, mass effect, evidence of mass lesion, intracranial hemorrhage or evidence of cortically based acute infarction.  Gray-white matter differentiation is within normal limits throughout the brain.  No suspicious intracranial vascular hyperdensity.  IMPRESSION: Normal noncontrast CT  appearance of the brain.  Original Report Authenticated By: Ulla Potash III, M.D.   Nm Pulmonary Perfusion  12/28/2011  *RADIOLOGY REPORT*  Clinical Data:   recent knee surgery.  Short of breath.  Concern pulmonary embolism.  NUCLEAR MEDICINE VENTILATION - PERFUSION LUNG SCAN  Technique:    Perfusion images were obtained in multiple projections after intravenous injection of Tc-45m MAA.  Radiopharmaceuticals: 3.0 mCi Tc-38m MAA.  Comparison: Chest radiograph 12/28/2011  Findings: .  Perfusion:  No wedge shaped peripheral perfusion defects to suggest acute pulmonary embolism  IMPRESSION:  Very low probability for acute pulmonary embolism.  Original Report Authenticated By: Genevive Bi, M.D.   US Renal Port  12/29/2011  *RADIOLOGY REPORT*  Clinical Data: Rule out hydronephrosis.  Elevated BUN and creatinine.  RENAL/URINARY TRACT ULTRASOUND COMPLETE  Comparison:  None  Findings:  Right Kidney:  12.1 cm. No hydronephrosis.  Normal renal cortical thickness and echogenicity.  Left Kidney:  13.2 cm. No hydronephrosis.  Normal renal cortical thickness and echogenicity.  Bladder:  Foley catheter within urinary bladder.  The bladder is not entirely collapsed.  Example image 12.  IMPRESSION:  1.  No urinary tract obstruction or explanation for elevated BUN/creatinine. 2.  Foley catheter within urinary bladder.  The bladder is incompletely collapsed.  Correlate with catheter function.  Original Report Authenticated By: Consuello Bossier, M.D.   Dg Chest Port 1 View  12/29/2011  *RADIOLOGY REPORT*  Clinical Data: Respiratory failure, shortness of breath  PORTABLE CHEST - 1 VIEW  Comparison: Chest x-ray of 12/28/2011  Findings: The lungs appear better aerated.  No definite pulmonary vascular congestion is seen.  Cardiomegaly is stable.  No effusion is noted.  There are degenerative changes in the thoracic spine.  IMPRESSION: Stable cardiomegaly.  Slightly better aeration.  No definite pulmonary vascular congestion.   Original Report Authenticated By: Juline Patch, M.D.    DISCHARGE INSTRUCTIONS: Discharge Orders    Future Orders Please Complete By Expires   Diet - low sodium heart healthy      Call MD / Call 911      Comments:   If you experience chest pain or shortness of breath, CALL 911 and be transported to the hospital emergency room.  If you develope a fever above 101 F, pus (white drainage) or increased drainage or redness at the wound, or calf pain, call your surgeon's office.   Constipation Prevention      Comments:   Drink plenty of fluids.  Prune juice may be helpful.  You may use a stool softener, such as Colace (over the counter) 100 mg twice a day.  Use MiraLax (over the counter) for constipation as needed.   Increase activity slowly as tolerated      Weight Bearing as taught in Physical Therapy      Comments:   Use a walker or crutches as instructed.   Driving restrictions      Comments:   No driving for 6 weeks   Lifting restrictions      Comments:   No lifting for 6 weeks   CPM      Comments:   Continuous passive motion machine (CPM):      Use the CPM from 0 to 90 for 6-8 hours per day.      You may increase by 10 per day.  You may break it up into 2 or 3 sessions per day.      Use CPM for 2 weeks or until you are told to stop.   TED hose      Comments:   Use stockings (TED hose) for 3 weeks on both leg(s).  You may remove them at night for sleeping.   Change dressing      Comments:   Change dressing on friday, then change the dressing daily with sterile 4 x 4 inch gauze dressing and apply TED hose.  You may clean the  incision with alcohol prior to redressing.   Do not put a pillow under the knee. Place it under the heel.         DISCHARGE MEDICATIONS:   Medication List  As of 12/30/2011  8:01 AM   TAKE these medications         citalopram 20 MG tablet   Commonly known as: CELEXA   Take 20 mg by mouth daily.      enoxaparin 40 MG/0.4ML injection   Commonly known as:  LOVENOX   Inject 0.4 mLs (40 mg total) into the skin every 12 (twelve) hours.      ibuprofen 200 MG tablet   Commonly known as: ADVIL,MOTRIN   Take 600-800 mg by mouth every 6 (six) hours as needed. For pain      lisinopril-hydrochlorothiazide 20-25 MG per tablet   Commonly known as: PRINZIDE,ZESTORETIC   Take 1 tablet by mouth daily.      LORazepam 1 MG tablet   Commonly known as: ATIVAN   Take 1-3 mg by mouth at bedtime. Depends on pain      methocarbamol 500 MG tablet   Commonly known as: ROBAXIN   Take 1-2 tablets (500-1,000 mg total) by mouth every 6 (six) hours as needed.      traMADol 50 MG tablet   Commonly known as: ULTRAM   Take 1-2 tablets (50-100 mg total) by mouth every 4 (four) hours as needed.            FOLLOW UP VISIT:   Follow-up Information    Follow up with Raymon Mutton, MD. Call on 01/10/2012.   Contact information:   [address] [place] [address] [phone]          DISPOSITION:  Home    CONDITION:  Good   Reginna Sermeno 12/30/2011, 8:01 AM

## 2011-12-30 NOTE — Progress Notes (Signed)
CARE MANAGEMENT NOTE 12/30/2011  Patient:  Kimberly Love, Kimberly Love   Account Number:  [id number]  Date Initiated:  12/30/2011  Documentation initiated by:  Vance Peper  Subjective/Objective Assessment:   61 yr old female s/p right total knee arthroplasty     Action/Plan:   patient preoperatively setup with [facility], no changes. DME has been delivered to the home.   Anticipated DC Date:  12/30/2011   Anticipated DC Plan:  HOME W HOME HEALTH SERVICES      DC Planning Services  CM consult      [facility] Choice  HOME HEALTH   Choice offered to / List presented to:          [facility] arranged  HH-2 PT      [facility] agency  [facility]   Status of service:  Completed, signed off  Discharge Disposition:  HOME W HOME HEALTH SERVICES

## 2011-12-30 NOTE — Progress Notes (Signed)
Subjective:  no new complaints.   Objective: Weight change:   Intake/Output Summary (Last 24 hours) at 12/30/11 0738 Last data filed at 12/30/11 0558  Gross per 24 hour  Intake    375 ml  Output   2575 ml  Net  -2200 ml    Filed Vitals:   12/30/11 0556  BP: 128/75  Pulse: 75  Temp: 97.8 F (36.6 C)  Resp: 18   On Exam  She is alert Afebrile Comfortable  [employer] s1s2 heard  Lungs Good air entry bilateral  Abdomen: soft non tender non distended bowel sounds heard  Extremities: Right leg wrapped.  Neuro: alert oriented 3, no focal deficits.   Lab Results: Results for orders placed during the hospital encounter of 12/26/11 (from the past 24 hour(s))  BASIC METABOLIC PANEL     Status: Abnormal   Collection Time   12/30/11  6:20 AM      Component Value Range   Sodium 141  135 - 145 (mEq/L)   Potassium 3.8  3.5 - 5.1 (mEq/L)   Chloride 105  96 - 112 (mEq/L)   CO2 32  19 - 32 (mEq/L)   Glucose, Bld 108 (*) 70 - 99 (mg/dL)   BUN 13  6 - 23 (mg/dL)   Creatinine, Ser 1.61  0.50 - 1.10 (mg/dL)   Calcium 8.8  8.4 - 09.6 (mg/dL)   GFR calc non Af Amer >90  >90 (mL/min)   GFR calc Af Amer >90  >90 (mL/min)  CBC     Status: Abnormal   Collection Time   12/30/11  6:20 AM      Component Value Range   WBC 10.3  4.0 - 10.5 (K/uL)   RBC 3.30 (*) 3.87 - 5.11 (MIL/uL)   Hemoglobin 9.5 (*) 12.0 - 15.0 (g/dL)   HCT 04.5 (*) 40.9 - 46.0 (%)   MCV 90.0  78.0 - 100.0 (fL)   MCH 28.8  26.0 - 34.0 (pg)   MCHC 32.0  30.0 - 36.0 (g/dL)   RDW 81.1  91.4 - 78.2 (%)   Platelets 236  150 - 400 (K/uL)     Micro Results: Recent Results (from the past 240 hour(s))  URINE CULTURE     Status: Normal   Collection Time   12/28/11  9:47 AM      Component Value Range Status Comment   Specimen Description URINE, CATHETERIZED   Final    Special Requests ADDED AT 1153   Final    Culture  Setup Time [id number]   Final    Colony Count NO GROWTH   Final    Culture NO GROWTH   Final    Report Status  12/29/2011 FINAL   Final     Studies/Results: Dg Chest 2 View  12/28/2011  *RADIOLOGY REPORT*  Clinical Data: Hypoxemia, lethargy, shortness of breath, history seizures, hypertension  CHEST - 2 VIEW  Comparison: 12/19/2011  Findings: Enlargement of cardiac silhouette with pulmonary vascular congestion. Question minimal perihilar infiltrate versus edema. Minimal basilar atelectasis. No definite pleural effusion or pneumothorax. Elongation thoracic aorta. Multilevel endplate spur formation thoracic spine. Prominent air fluid level within stomach.  IMPRESSION: Enlargement of cardiac silhouette with pulmonary vascular congestion. Question mild perihilar edema with bibasilar atelectasis.  Original Report Authenticated By: Lollie Marrow, M.D.   Dg Chest 2 View  12/19/2011  *RADIOLOGY REPORT*  Clinical Data: Preoperative respiratory evaluation for knee replacement.  CHEST - 2 VIEW  Comparison: None.  Findings: The lungs are  clear without focal infiltrate, edema, pneumothorax or pleural effusion. Cardiopericardial silhouette is at upper limits of normal for size. Imaged bony structures of the thorax are intact.  IMPRESSION: No acute cardiopulmonary findings.  Original Report Authenticated By: ERIC A. MANSELL, M.D.   Ct Head Wo Contrast  12/28/2011  *RADIOLOGY REPORT*  Clinical Data: 61 year old female with weakness, lethargy.  CT HEAD WITHOUT CONTRAST  Technique:  Contiguous axial images were obtained from the base of the skull through the vertex without contrast.  Comparison: None.  Findings: Visualized orbits and scalp soft tissues are within normal limits.  Visualized paranasal sinuses and mastoids are clear.  No acute osseous abnormality identified.  Cerebral volume is within normal limits for age.  No midline shift, ventriculomegaly, mass effect, evidence of mass lesion, intracranial hemorrhage or evidence of cortically based acute infarction.  Gray-white matter differentiation is within normal limits  throughout the brain.  No suspicious intracranial vascular hyperdensity.  IMPRESSION: Normal noncontrast CT appearance of the brain.  Original Report Authenticated By: Ulla Potash III, M.D.   Nm Pulmonary Perfusion  12/28/2011  *RADIOLOGY REPORT*  Clinical Data:   recent knee surgery.  Short of breath.  Concern pulmonary embolism.  NUCLEAR MEDICINE VENTILATION - PERFUSION LUNG SCAN  Technique:    Perfusion images were obtained in multiple projections after intravenous injection of Tc-53m MAA.  Radiopharmaceuticals: 3.0 mCi Tc-25m MAA.  Comparison: Chest radiograph 12/28/2011  Findings: .  Perfusion:  No wedge shaped peripheral perfusion defects to suggest acute pulmonary embolism  IMPRESSION:  Very low probability for acute pulmonary embolism.  Original Report Authenticated By: Genevive Bi, M.D.   US Renal Port  12/29/2011  *RADIOLOGY REPORT*  Clinical Data: Rule out hydronephrosis.  Elevated BUN and creatinine.  RENAL/URINARY TRACT ULTRASOUND COMPLETE  Comparison:  None  Findings:  Right Kidney:  12.1 cm. No hydronephrosis.  Normal renal cortical thickness and echogenicity.  Left Kidney:  13.2 cm. No hydronephrosis.  Normal renal cortical thickness and echogenicity.  Bladder:  Foley catheter within urinary bladder.  The bladder is not entirely collapsed.  Example image 12.  IMPRESSION:  1.  No urinary tract obstruction or explanation for elevated BUN/creatinine. 2.  Foley catheter within urinary bladder.  The bladder is incompletely collapsed.  Correlate with catheter function.  Original Report Authenticated By: Consuello Bossier, M.D.   Dg Chest Port 1 View  12/29/2011  *RADIOLOGY REPORT*  Clinical Data: Respiratory failure, shortness of breath  PORTABLE CHEST - 1 VIEW  Comparison: Chest x-ray of 12/28/2011  Findings: The lungs appear better aerated.  No definite pulmonary vascular congestion is seen.  Cardiomegaly is stable.  No effusion is noted.  There are degenerative changes in the thoracic spine.   IMPRESSION: Stable cardiomegaly.  Slightly better aeration.  No definite pulmonary vascular congestion.  Original Report Authenticated By: Juline Patch, M.D.   Medications: Scheduled Meds:   . celecoxib  200 mg Oral BID  . citalopram  20 mg Oral Daily  . docusate sodium  100 mg Oral BID  . enoxaparin  30 mg Subcutaneous Q12H  . DISCONTD: moxifloxacin  400 mg Intravenous Q24H   Continuous Infusions:   . bupivacaine ON-Q pain pump    . DISCONTD: sodium chloride 50 mL/hr (12/28/11 1808)  . DISCONTD: naloxone 0.25 mg/hr (12/29/11 0517)   PRN Meds:.acetaminophen, alum & mag hydroxide-simeth, bisacodyl, menthol-cetylpyridinium, methocarbamol (ROBAXIN) IV, methocarbamol, naloxone (NARCAN) injection, ondansetron (ZOFRAN) IV, phenol, senna-docusate, traMADol, DISCONTD: acetaminophen, DISCONTD: metoCLOPramide (REGLAN) injection, DISCONTD: metoCLOPramide, DISCONTD: ondansetron, DISCONTD:  zolpidem  Assessment/Plan: Patient Active Hospital Problem List: Respiratory acidosis (12/28/2011) Appears to have resolved.   Acute Hypercapneic respiratory failure (12/28/2011): probably from the excess narcotic pain medications. Resolved with Bipap use.   Avoid narcotic pain medications on discharge.   Altered mental status (12/28/2011) Secondary to metabolic encephalopathy . Resolved.  Pt is alert and oriented.  Leukocytosis: resolved. Repeat CXR does not show pneumonia and Urine culture is negative. Antibiotics were stopped. Probably a stress reaction and dehydration. Anemia : probably from the intraoperative blood loss. Outpatient follow up of CBC in one week.  Acute Renal Failure: probably Pre renal. Resolved with IV fluids. Hold ACE inhibitors.   Right TKR: continue with PT and further recommendations as per orthopedics.  HTN: controlled.  Depression : continue with celexa.   Pt is medically stable for discharge.      LOS: 4 days   Loral Campi 12/30/2011, 7:38 AM

## 2011-12-30 NOTE — Discharge Summary (Signed)
Dressing changed to R knee, old removed, site cleansed with sterile 4x4's and skin cleanser; new gauze dressing applied.  Pt tolerated well.  Staples to incision clean and intact.  Well approximated.  Lovenox teaching done with husband and pt; both verbalized understanding. Pt given written and verbal intstructions on d/c with scripts.  Pt has CPM machine at home already.   Pt assisted OOB to W/C x 2 assist with walker and d/c'd home with medical staff and husband at side.

## 2011-12-30 NOTE — Progress Notes (Signed)
Physical Therapy Treatment Patient Details Name: Kimberly Love MRN: [id number] DOB: 1951-04-03 Today's Date: 12/30/2011 Time: 4098-1191 PT Time Calculation (min): 49 min  PT Assessment / Plan / Recommendation Comments on Treatment Session  Pt more alert today.  Limited participation with therapy with increased encouragement.  Pt hoping to d/c home this pm with 24 hr assist.    Follow Up Recommendations  Home health PT    Equipment Recommendations  None recommended by OT    Frequency 7X/week   Plan Discharge plan remains appropriate;Frequency remains appropriate    Precautions / Restrictions Precautions Precautions: Knee Restrictions Weight Bearing Restrictions: Yes RLE Weight Bearing: Weight bearing as tolerated   Pertinent Vitals/Pain Only c/o minimal pain with tylenol.  RN aware.    Mobility  Bed Mobility Bed Mobility: Supine to Sit;Sitting - Scoot to Edge of Bed Supine to Sit: 4: Min assist;HOB flat;With rails Sitting - Scoot to Edge of Bed: 4: Min assist Details for Bed Mobility Assistance: Cues for technique and min assist needed at trunk Transfers Transfers: Sit to Stand;Stand to Sit Sit to Stand: 4: Min assist;From bed;From chair/3-in-1;With upper extremity assist;With armrests Stand to Sit: 4: Min assist;With armrests;To chair/3-in-1 Details for Transfer Assistance: Cues for hand placement and R LE positioning from bed.  Able to control descent and reach back for armrests with stand-sit. Ambulation/Gait Ambulation/Gait Assistance: 4: Min guard Ambulation Distance (Feet): 50 Feet Assistive device: Rolling walker Ambulation/Gait Assistance Details: cues for sequence and positioning RW Gait Pattern: Step-to pattern;Trunk flexed;Antalgic Gait velocity: decreased Stairs: Yes Stairs Assistance: 4: Min assist Stairs Assistance Details (indicate cue type and reason): Cues to sequence secondary to initial instruction. Stair Management Technique: Backwards;With  walker Number of Stairs: 2  Wheelchair Mobility Wheelchair Mobility: No    Exercises     PT Goals Acute Rehab PT Goals Time For Goal Achievement: 01/03/12 Potential to Achieve Goals: Good PT Goal: Supine/Side to Sit - Progress: Progressing toward goal PT Goal: Sit to Stand - Progress: Progressing toward goal PT Transfer Goal: Bed to Chair/Chair to Bed - Progress: Progressing toward goal PT Goal: Ambulate - Progress: Progressing toward goal PT Goal: Perform Home Exercise Program - Progress: Progressing toward goal  Visit Information  Last PT Received On: 12/30/11 Assistance Needed: +1    Subjective Data  Subjective: "I've been doing my exercises.  I don't want to do anything else."   Cognition  Overall Cognitive Status: Appears within functional limits for tasks assessed/performed Arousal/Alertness: Awake/alert Orientation Level: Appears intact for tasks assessed Behavior During Session: [facility] for tasks performed    Balance  Balance Balance Assessed: No  End of Session PT - End of Session Equipment Utilized During Treatment: Gait belt Activity Tolerance: Patient tolerated treatment well Patient left: in chair;with call bell/phone within reach Nurse Communication: Mobility status    [name] 12/30/2011, 1:59 PM  [name], PTA Acute Rehab [phone] (office)

## 2012-01-17 ENCOUNTER — Ambulatory Visit (HOSPITAL_COMMUNITY)
Admission: RE | Admit: 2012-01-17 | Discharge: 2012-01-17 | Disposition: A | Discharge status: Home / Self Care | Payer: Managed Care, Other (non HMO) | Source: Ambulatory Visit | Attending: Orthopedic Surgery | Admitting: Orthopedic Surgery

## 2012-01-17 DIAGNOSIS — M25669 Stiffness of unspecified knee, not elsewhere classified: Secondary | ICD-10-CM | POA: Insufficient documentation

## 2012-01-17 DIAGNOSIS — IMO0001 Reserved for inherently not codable concepts without codable children: Secondary | ICD-10-CM | POA: Insufficient documentation

## 2012-01-17 DIAGNOSIS — M25569 Pain in unspecified knee: Secondary | ICD-10-CM | POA: Insufficient documentation

## 2012-01-17 DIAGNOSIS — M6281 Muscle weakness (generalized): Secondary | ICD-10-CM | POA: Insufficient documentation

## 2012-01-17 NOTE — Evaluation (Signed)
Physical Therapy Evaluation  Patient Details  Name: Kimberly Love MRN: [id number] Date of Birth: Mar 03, 1951  Today's Date: 01/17/2012 Time: 4098-1191 PT Time Calculation (min): 40 min  Visit#: 1  of 12   Re-eval: 02/16/12    Past Medical History:  Past Medical History  Diagnosis Date  . Complication of anesthesia     has seizure in 1973  while waking up  . Seizures     x1  . Anxiety   . Depression   . Hypertension     dr Timothy Lasso   halll  [place]  . PONV (postoperative nausea and vomiting)    Past Surgical History:  Past Surgical History  Procedure Date  . Tonsillectomy   . Cholecystectomy   . Breast surgery     bio  2012   neg  . Abdominal hysterectomy   . Orthorscopy     rt knee  . 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  She ended up in ICU due to complications with the anethsia.  The patient was not released  from the hospital until 12/30/2011.  The patient states she then recieved home health which ended 01/16/2012.   She is now being referred to out-patient therapy to improve her functional level.   How long can you sit comfortably?: She states she is able to sit for 45 minutes to an hour before her knee starts to feel like it is stiffening up. How long can you stand comfortably?: The patient is able to stand for thirty minutes. How long can you walk comfortably?: The patient is walking with a cane and is able to walk for ten minutes without stopping. Special Tests: The patient is sleeping alright as well as she is taking her medication. Pain Assessment Currently in Pain?: Yes Pain Score:   1 (highest pain has been a 5/10) Pain Location: Knee Pain Orientation: Right Pain Type: Surgical  pain Pain Onset: 1 to 4 weeks ago Pain Frequency: Intermittent Pain Relieving Factors: ice, and medication.  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 Leisure: Hobbies-yes (Comment) Comments: day trips; gardening  Cognition/Observation Cognition Arousal/Alertness: Awake/alert   Assessment RLE AROM (degrees) Right Knee Extension 0-130: 18  Right Knee Flexion 0-140: 100  RLE PROM (degrees) Right Knee Extension 0-130: 15  Right Knee Flexion 0-140: 106  RLE Strength Right Hip Flexion: 5/5 Right Hip Extension: 3-/5 Right Hip ABduction: 5/5 Right Hip ADduction: 4/5 Right Knee Flexion: 3-/5 Right Knee Extension: 3/5 Right Ankle Dorsiflexion: 3+/5  Exercise/Treatments Mobility/Balance     Supine Quad Sets: Right;5 reps Heel Slides: 5 reps Hip Adduction Isometric: Right;5 reps Bridges: Strengthening;5 reps Other Supine Knee Exercises: PROM for ext/flex Sidelying   Prone  Hip Extension: 5 reps   Modalities Modalities: Cryotherapy Cryotherapy Cryotherapy Location: Knee Type of Cryotherapy: Ice pack  Physical Therapy Assessment and Plan PT Assessment and Plan Clinical Impression Statement: Pt with decreased ROM, decreased strength and balance who will benefit from skilled PT to improve function to return to previous functional  level without pain. Pt will benefit from skilled therapeutic intervention in order to improve on the following deficits: Decreased strength;Increased edema;Decreased activity tolerance;Decreased range of motion;Difficulty walking Rehab Potential: Good PT Frequency: Min 3X/week PT Duration: 4 weeks PT Treatment/Interventions: Gait  training;Therapeutic activities;Therapeutic exercise;Patient/family education PT Plan: begin bike, rockerboard, SLS, standing and supine terminal extension, active hamstretch next treatment.    Goals Home Exercise Program Pt will Perform Home Exercise Program: Independently PT Short Term Goals Time to Complete Short Term Goals: 2 weeks PT Short Term Goal 1: Pt will walk in her home without the cane PT Short Term Goal 2: Pt ROM to be -10 to 110 to allow more normalized walking PT Long Term Goals Time to Complete Long Term Goals: 4 weeks PT Long Term Goal 1: Pt ROM to be -3 to 120  PT Long Term Goal 2: Pain no greater than a 2 to allow pt to wean self from pain meds. Long Term Goal 3: Pt to ambulate outside without the cane. Long Term Goal 4: Pt strength to be improved one grade to allow pt to feel comfortable walking for over an hour PT Long Term Goal 5: Pt to RTW  Additional PT Long Term Goals?: Yes PT Long Term Goal 6: Pt to be able to ride comfortable in a car for two hours to allow for short tripsl  Problem List Patient Active Problem List  Diagnoses  . Respiratory acidosis  . Acute respiratory failure  . Altered mental status  . Hypoxemia    PT - End of Session Equipment Utilized During Treatment: Gait belt Activity Tolerance: Patient tolerated treatment well General Behavior During Session: [facility] for tasks performed Cognition: [facility] for tasks performed    Kayson Bullis,CINDY 01/17/2012, 8:49 AM  Physician Documentation Your signature is required to indicate approval of the treatment plan as stated above.  Please sign and either send electronically or make a copy of this report for your files and return this physician signed original.   Please mark one 1.__approve of plan  2. ___approve of plan with the following conditions.   ______________________________                                                          _____________________ Physician Signature                                                                                                              Date

## 2012-01-19 ENCOUNTER — Ambulatory Visit (HOSPITAL_COMMUNITY)
Admission: RE | Admit: 2012-01-19 | Discharge: 2012-01-19 | Disposition: A | Discharge status: Home / Self Care | Payer: Managed Care, Other (non HMO) | Source: Ambulatory Visit | Attending: Orthopedic Surgery | Admitting: Orthopedic Surgery

## 2012-01-19 NOTE — Progress Notes (Signed)
Physical Therapy Treatment Patient Details  Name: Kimberly Love MRN: [id number] Date of Birth: [birth year]  Today's Date: 01/19/2012 Time: 4098-1191 PT Time Calculation (min): 55 min  Visit#: 2  of 12   Re-eval: 02/16/12 Charges: therex x 35' Ice x 10'   Subjective: Symptoms/Limitations Symptoms: Pt reports HEP compliance. Pain Assessment Currently in Pain?: Yes Pain Score:   1 Pain Location: Knee Pain Orientation: Right   Exercise/Treatments Standing Heel Raises: 10 reps;Limitations Heel Raises Limitations: Toe raises x 10 Lateral Step Up: 10 reps;Right;Hand Hold: 2;Step Height: 4" Forward Step Up: 10 reps;Right;Step Height: 4" Functional Squat: 10 reps Rocker Board: 2 minutes Supine Quad Sets: 10 reps;Limitations Quad Sets Limitations: 5" holds Short Arc [employer] Sets: 10 reps;Limitations Short Arc Quad Sets Limitations: 5" holds Heel Slides: 10 reps Bridges: Limitations Bridges Limitations: x8 Other Supine Knee Exercises: PROM for ext/flex   Modalities Modalities: Cryotherapy Cryotherapy Number Minutes Cryotherapy: 10 Minutes Cryotherapy Location: Knee (Right) Type of Cryotherapy: Ice pack  Physical Therapy Assessment and Plan PT Assessment and Plan Clinical Impression Statement: Tx somewhat limited by pain. Pt very sensitive to PROM. Pt requests to stop bike early and reports she "just couldn't do anymore." Ice applied to knee at end of session to decrease pain and limit swelling. Pt reports pain increase to 6/10 after therex then a decrease to 3/10 after ice at end of session. PT Plan: Continue to progress per PT POC.     Problem List Patient Active Problem List  Diagnoses  . Respiratory acidosis  . Acute respiratory failure  . Altered mental status  . Hypoxemia    PT - End of Session Activity Tolerance: Patient tolerated treatment well;Patient limited by pain General Behavior During Session: [facility] for tasks performed Cognition: [facility] for tasks  performed   Seth Bake, PTA 01/19/2012, 6:12 PM

## 2012-01-25 ENCOUNTER — Telehealth (HOSPITAL_COMMUNITY): Payer: Self-pay

## 2012-01-25 ENCOUNTER — Ambulatory Visit (HOSPITAL_COMMUNITY): Payer: Managed Care, Other (non HMO) | Admitting: Physical Therapy

## 2012-01-26 ENCOUNTER — Ambulatory Visit (HOSPITAL_COMMUNITY)
Admission: RE | Admit: 2012-01-26 | Discharge: 2012-01-26 | Disposition: A | Discharge status: Home / Self Care | Payer: Managed Care, Other (non HMO) | Source: Ambulatory Visit | Attending: Orthopedic Surgery | Admitting: Orthopedic Surgery

## 2012-01-26 NOTE — Progress Notes (Signed)
Physical Therapy Treatment Patient Details  Name: Kimberly Love MRN: [id number] Date of Birth: [birth year]  Today's Date: 01/26/2012 Time: 0805-0900 PT Time Calculation (min): 55 min  Visit#: 3  of 12   Re-eval: 02/16/12 Diagnosis: R TKA Surgical Date: 12/26/11 Charges:  therex 38'', ice 10'  Subjective: Symptoms/Limitations Symptoms: Pt. reports not much pain this morning, 1/10 today. Pain Assessment Currently in Pain?: Yes Pain Score:   1 Pain Location: Knee Pain Orientation: Right   Exercise/Treatments Aerobic Stationary Bike: 6'@2 .0, seat 10 Standing Heel Raises: 15 reps Heel Raises Limitations: Toe raises x 15 Terminal Knee Extension: 10 reps;Theraband Theraband Level (Terminal Knee Extension): Level 4 (Blue) Lateral Step Up: 10 reps;Right;Step Height: 4";Hand Hold: 1 Forward Step Up: 10 reps;Right;Step Height: 4";Hand Hold: 1 Step Down: 10 reps;Hand Hold: 1;Step Height: 2" Functional Squat: 10 reps Rocker Board: 2 minutes Supine Quad Sets: 10 reps Quad Sets Limitations: 5"holds Short Arc Quad Sets: 10 reps;Limitations Short Arc Quad Sets Limitations: 5"holds Knee Extension: PROM;2 sets;Limitations Knee Extension Limitations: 20" holds Knee Flexion: PROM   Modalities Modalities: Cryotherapy Cryotherapy Number Minutes Cryotherapy: 10 Minutes Cryotherapy Location: Knee Type of Cryotherapy: Ice pack  Physical Therapy Assessment and Plan PT Assessment and Plan Clinical Impression Statement: Pt. able to complete bike without stopping due to pain.  Unable to perform forward step downs using 4" in proper form/eccentric control, decreased to 2" with improved quality.  Added standing TKE with theraband to increase quad strength.  Pt. continues to have increased sensitivity to PROM.  PT Plan: Continue to improve ROM and strength while decreasing pain.     Problem List Patient Active Problem List  Diagnoses  . Respiratory acidosis  . Acute respiratory  failure  . Altered mental status  . Hypoxemia    PT - End of Session Activity Tolerance: Patient tolerated treatment well General Behavior During Session: [facility] for tasks performed Cognition: [facility] for tasks performed   [name], PTA/CLT 01/26/2012, 9:16 AM

## 2012-01-27 ENCOUNTER — Ambulatory Visit (HOSPITAL_COMMUNITY)
Admission: RE | Admit: 2012-01-27 | Discharge: 2012-01-27 | Disposition: A | Discharge status: Home / Self Care | Payer: Managed Care, Other (non HMO) | Source: Ambulatory Visit

## 2012-01-27 NOTE — Progress Notes (Signed)
Physical Therapy Treatment Patient Details  Name: Kimberly Love MRN: [id number] Date of Birth: February 01, 1951  Today's Date: 01/27/2012 Time: 0937-1028 PT Time Calculation (min): 51 min  Visit#: 4  of 12   Re-eval: 02/16/12 Assessment Diagnosis: R TKA Surgical Date: 12/26/11 Charge: therex 40 min Ice 10 min  Subjective: Symptoms/Limitations Symptoms: Min pain maybe a 1/10 pain scale today, pt brought in her [facility] but not ambulating with any AD.  Pt self cueing herself to reduce antalgic gait.   Pain Assessment Currently in Pain?: Yes Pain Score:   1 Pain Location: Knee Pain Orientation: Right  Objective:   Exercise/Treatments Aerobic Stationary Bike: 6'@2 .0, seat 10 Standing Heel Raises: 20 reps;Limitations Heel Raises Limitations: Toe raises x 20 Lateral Step Up: 10 reps;Right;Step Height: 4";Hand Hold: 1 Forward Step Up: 10 reps;Right;Step Height: 4";Hand Hold: 1 Step Down: 10 reps;Hand Hold: 1;Step Height: 2" Functional Squat: 10 reps;Limitations Functional Squat Limitations: pick up orange ball off 6in step Rocker Board: 2 minutes;Limitations Rocker Board Limitations: R/L with fingertips Supine Quad Sets: 10 reps;Limitations Quad Sets Limitations: 5" holds Heel Slides: 10 reps;Limitations Heel Slides Limitations: 5" holds Knee Extension: PROM;3 sets;Limitations Knee Extension Limitations: 3x 30" Knee Flexion: PROM;2 sets;Limitations Knee Flexion Limitations: 2x 30"   Modalities Modalities: Cryotherapy Cryotherapy Number Minutes Cryotherapy: 10 Minutes Cryotherapy Location: Knee Type of Cryotherapy: Ice pack  Physical Therapy Assessment and Plan PT Assessment and Plan Clinical Impression Statement: Added chair stretch to increase knee flexion.  Vc-ing required to reduce circumduction with forward step ups  Step down still difficult with 4", reduced to 2" height for improved quality.  Pt with increased sensitivity with PROM.  Pain reduced with ice at end  of session. PT Plan: Continue to improve ROM and strength while decreasing pain    Goals    Problem List Patient Active Problem List  Diagnoses  . Respiratory acidosis  . Acute respiratory failure  . Altered mental status  . Hypoxemia    PT - End of Session Activity Tolerance: Patient tolerated treatment well General Behavior During Session: [facility] for tasks performed Cognition: [facility] for tasks performed  GP No functional reporting required  [name], PTA 01/27/2012, 11:01 AM

## 2012-01-30 ENCOUNTER — Ambulatory Visit (HOSPITAL_COMMUNITY): Payer: Managed Care, Other (non HMO) | Admitting: *Deleted

## 2012-01-30 ENCOUNTER — Telehealth (HOSPITAL_COMMUNITY): Payer: Self-pay

## 2012-02-01 ENCOUNTER — Ambulatory Visit (HOSPITAL_COMMUNITY): Payer: Managed Care, Other (non HMO) | Admitting: Physical Therapy

## 2012-02-02 ENCOUNTER — Ambulatory Visit (HOSPITAL_COMMUNITY)
Admission: RE | Admit: 2012-02-02 | Discharge: 2012-02-02 | Disposition: A | Discharge status: Home / Self Care | Payer: Managed Care, Other (non HMO) | Source: Ambulatory Visit | Attending: Orthopedic Surgery | Admitting: Orthopedic Surgery

## 2012-02-02 DIAGNOSIS — M25569 Pain in unspecified knee: Secondary | ICD-10-CM | POA: Insufficient documentation

## 2012-02-02 DIAGNOSIS — M25669 Stiffness of unspecified knee, not elsewhere classified: Secondary | ICD-10-CM | POA: Insufficient documentation

## 2012-02-02 DIAGNOSIS — M6281 Muscle weakness (generalized): Secondary | ICD-10-CM | POA: Insufficient documentation

## 2012-02-02 DIAGNOSIS — IMO0001 Reserved for inherently not codable concepts without codable children: Secondary | ICD-10-CM | POA: Insufficient documentation

## 2012-02-02 NOTE — Progress Notes (Signed)
Physical Therapy Treatment Patient Details  Name: Kimberly Love MRN: [id number] Date of Birth: 1951-08-26  Today's Date: 02/02/2012 Time: 1302-1354 PT Time Calculation (min): 52 min Visit#: 5  of 12   Re-eval: 02/16/12 Charges:  therex 45', ice 6' (no charge)   Subjective: Symptoms/Limitations Symptoms: Pt. states she had alot of soreness after last session but eased off.  Reports pain is like a "dull toothache" today, 1/10 in her quad and IT band areas.  Reports her pain never gets greater than a 5/10.  States she's been using ice at home.  Pain Assessment Currently in Pain?: Yes Pain Score:   1 Pain Location: Knee Pain Orientation: Right  Objective:  R Knee AROM: 13-100 degrees (was 18-100 degrees)          R Knee PROM: 5-108  degrees  (was 15-106 degrees)  Exercise/Treatments Aerobic Stationary Bike: 3'@2 .5, seat 10 Tread Mill: 3' gait training footfalls, 0.29mph Standing Heel Raises: 20 reps;Limitations Heel Raises Limitations: Toe raises x 20 Terminal Knee Extension: 10 reps Theraband Level (Terminal Knee Extension): Level 4 (Blue) Lateral Step Up: 15 reps;Right;Step Height: 4";Hand Hold: 1 Forward Step Up: 15 reps;Step Height: 4";Hand Hold: 0 Step Down: 10 reps;Step Height: 4";Hand Hold: 1 Functional Squat: 10 reps;Limitations Functional Squat Limitations: pick up orange ball off 8in step Rocker Board: 2 minutes;Limitations Rocker Board Limitations: R/L with fingertips Supine Quad Sets: 10 reps;Limitations Quad Sets Limitations: 5"holds Heel Slides: 10 reps;Limitations Heel Slides Limitations: 5" holds Knee Extension: PROM;3 sets;Limitations Knee Extension Limitations: 30" Knee Flexion: PROM;2 sets;Limitations Knee Flexion Limitations: 30"   Modalities Modalities: Cryotherapy Cryotherapy Number Minutes Cryotherapy: 6 Minutes Cryotherapy Location: Knee Type of Cryotherapy: Ice pack  Physical Therapy Assessment and Plan PT Assessment and Plan Clinical  Impression Statement: Pt. able to complete forward step downs with 4" step today.  Began gait training on treadmill to increase step length, reduce antalgia.  Pt. with poor tolerance for PROM today but with 10 degree increase for extension.  Pt. unable to tolerate ice greater than 6' today. PT Plan: Continue POC and continue to progress toward goals.     Problem List Patient Active Problem List  Diagnoses  . Respiratory acidosis  . Acute respiratory failure  . Altered mental status  . Hypoxemia    PT - End of Session Activity Tolerance: Patient tolerated treatment well General Behavior During Session: [facility] for tasks performed Cognition: [facility] for tasks performed   [name], PTA/CLT 02/02/2012, 2:21 PM

## 2012-02-03 ENCOUNTER — Inpatient Hospital Stay (HOSPITAL_COMMUNITY)
Admission: RE | Admit: 2012-02-03 | Payer: Managed Care, Other (non HMO) | Source: Ambulatory Visit | Admitting: Physical Therapy

## 2012-02-03 ENCOUNTER — Telehealth (HOSPITAL_COMMUNITY): Payer: Self-pay | Admitting: Physical Therapy

## 2012-02-06 ENCOUNTER — Ambulatory Visit (HOSPITAL_COMMUNITY)
Admission: RE | Admit: 2012-02-06 | Discharge: 2012-02-06 | Disposition: A | Discharge status: Home / Self Care | Payer: Managed Care, Other (non HMO) | Source: Ambulatory Visit | Attending: Internal Medicine | Admitting: Internal Medicine

## 2012-02-06 NOTE — Progress Notes (Signed)
Physical Therapy Treatment Patient Details  Name: Kimberly Love MRN: [id number] Date of Birth: December 03, 1950  Today's Date: 02/06/2012 Time: 0800-0855 PT Time Calculation (min): 55 min  Visit#: 6  of 12   Re-eval: 02/16/12 Assessment Diagnosis: R TKA Surgical Date: 12/26/11 Next MD Visit: 02/10/2012 Charge: therex 40 min Gait 4 min Ice 10 min  Subjective: Symptoms/Limitations Symptoms: Pt stated she was a little stiff this morning, reports pain is like a "dull toothache" today.  Pain scale 2/10 today.  Pt reported MD gave her permission to drive, going to parking lot to practice before getting on the road.   Pain Assessment Currently in Pain?: Yes Pain Score:   2 Pain Location: Knee Pain Orientation: Right  Objective:   Exercise/Treatments Stretches Active Hamstring Stretch: Limitations Active Hamstring Stretch Limitations: resume next session Quad Stretch: Limitations Quad Stretch Limitations: add next session Knee: Self-Stretch to increase Flexion: 3 reps;30 seconds Gastroc Stretch: 3 reps;30 seconds;Limitations Gastroc Stretch Limitations: slant board Aerobic Stationary Bike: 6' @ 3.5 seat 9 Tread Mill: 3\' 38"  gait trainer .45 cyc/sec Standing Heel Raises: 20 reps;Limitations Heel Raises Limitations: Toe raises x 20 Terminal Knee Extension: 10 reps Theraband Level (Terminal Knee Extension): Level 4 (Blue) Lateral Step Up: 20 reps;Hand Hold: 1;Step Height: 4" Forward Step Up: 20 reps;Hand Hold: 0;Step Height: 4" Step Down: 15 reps;Hand Hold: 1;Step Height: 4" Functional Squat: 15 reps;Limitations Functional Squat Limitations: pick up orange ball off 6in step Rocker Board: 2 minutes Rocker Board Limitations: R/L with fingertips Seated Other Seated Knee Exercises: 5 STS  Supine Quad Sets: 10 reps;Limitations Quad Sets Limitations: 5"holds Heel Slides: 10 reps;Limitations Heel Slides Limitations: 5" holds Knee Extension: PROM;3 sets;Limitations Knee  Extension Limitations: 30" Knee Flexion: PROM;2 sets;Limitations Knee Flexion Limitations: 30"   Modalities Modalities: Cryotherapy Cryotherapy Number Minutes Cryotherapy: 10 Minutes Cryotherapy Location: Knee Type of Cryotherapy: Ice pack  Physical Therapy Assessment and Plan PT Assessment and Plan Clinical Impression Statement: Activity tolerance improving, able to increase reps with therex and time with gait trainer today.  Pt stated calf discomfort following step training, instructed gastroc stretch with discormfort resolved.  Resumed chair stretch for increased knee flexion.  Pt with poor tolerance with PROM, able to increase knee extension by 2 degrees today.  PT Plan: Re-eval prior MD apt next session.    Goals    Problem List Patient Active Problem List  Diagnoses  . Respiratory acidosis  . Acute respiratory failure  . Altered mental status  . Hypoxemia    PT - End of Session Activity Tolerance: Patient tolerated treatment well General Behavior During Session: [facility] for tasks performed Cognition: [facility] for tasks performed  GP No functional reporting required  [name], PTA 02/06/2012, 9:10 AM

## 2012-02-08 ENCOUNTER — Telehealth (HOSPITAL_COMMUNITY): Payer: Self-pay

## 2012-02-08 ENCOUNTER — Ambulatory Visit (HOSPITAL_COMMUNITY): Payer: Managed Care, Other (non HMO)

## 2012-02-10 ENCOUNTER — Ambulatory Visit (HOSPITAL_COMMUNITY): Payer: Managed Care, Other (non HMO) | Admitting: *Deleted

## 2012-02-13 ENCOUNTER — Ambulatory Visit (HOSPITAL_COMMUNITY): Payer: Managed Care, Other (non HMO) | Admitting: *Deleted

## 2012-02-14 ENCOUNTER — Ambulatory Visit (HOSPITAL_COMMUNITY)
Admission: RE | Admit: 2012-02-14 | Discharge: 2012-02-14 | Disposition: A | Discharge status: Home / Self Care | Payer: Managed Care, Other (non HMO) | Source: Ambulatory Visit | Attending: Internal Medicine | Admitting: Internal Medicine

## 2012-02-14 NOTE — Progress Notes (Signed)
Physical Therapy Treatment Patient Details  Name: Kimberly Love MRN: [id number] Date of Birth: 1951-02-03  Today's Date: 02/14/2012 Time: 1102-1210 PT Time Calculation (min): 68 min Charges: 43' TE, 15' Manual, 1 ice Visit#: 7  of 12   Re-eval: 02/16/12 Assessment Diagnosis: R TKA Surgical Date: 12/26/11 Next MD Visit: Dr. Sherlean Foot - 03/09/12   Subjective: Symptoms/Limitations Symptoms: Pt reports that she twisted her knee a little last night.  She took some pain medication last night and is not doing to bad today.  She reports her biggest problem is limping with gait because of decreased knee fleixon  Precautions/Restrictions     Exercise/Treatments Stretches Knee: Self-Stretch to increase Flexion: 3 reps;60 seconds Aerobic Stationary Bike: 6' @ 3.5 seat 8 Machines for Strengthening Cybex Knee Extension: 2.5 pl x20 TKF Cybex Knee Flexion: 2 pl x 20 eccentric lowering Standing Heel Raises: Other (comment) (HEP) Heel Raises Limitations: HEP toe raises Knee Flexion: 20 reps;Limitations Knee Flexion Limitations: TKF 4 in step 3# Lateral Step Up: Right;20 reps;Step Height: 4";Hand Hold: 2;Other (comment) (finger support) Functional Squat: Other (comment) (HEP) Supine Heel Slides: Other (comment) (HEP)  Modalities Modalities: Cryotherapy Manual Therapy Manual Therapy: Joint mobilization Joint Mobilization: Grade III to improve knee flexion w/PROM after.  to proximal tibia, fibula and patella.  AROM: 3-108, PROM: 3-114 Cryotherapy Number Minutes Cryotherapy: 10 Minutes Cryotherapy Location: Knee Type of Cryotherapy: Ice pack  Physical Therapy Assessment and Plan PT Assessment and Plan Clinical Impression Statement: Treatment focused on improving functional knee flexion.  added new exercises and educated to continue with her HEP.  Manual treatment and ice completed by PTA (RS).  Grade III to improve knee flexion w/PROM after.  to proximal tibia, fibula and patella.   AROM: 3-108, PROM: 3-114 after treatment today PT Plan: Continue to progress per PT POC.  Pt next MD apt 03/10/12    Goals    Problem List Patient Active Problem List  Diagnosis  . Respiratory acidosis  . Acute respiratory failure  . Altered mental status  . Hypoxemia    PT - End of Session Activity Tolerance: Patient tolerated treatment well General Behavior During Session: [facility] for tasks performed Cognition: [facility] for tasks performed  GP No functional reporting required  Hatem Cull 02/14/2012, 12:22 PM

## 2012-02-15 ENCOUNTER — Telehealth (HOSPITAL_COMMUNITY): Payer: Self-pay

## 2012-02-15 ENCOUNTER — Ambulatory Visit (HOSPITAL_COMMUNITY): Payer: Managed Care, Other (non HMO) | Admitting: *Deleted

## 2012-02-17 ENCOUNTER — Ambulatory Visit (HOSPITAL_COMMUNITY): Payer: Managed Care, Other (non HMO) | Admitting: Physical Therapy

## 2012-02-17 ENCOUNTER — Telehealth (HOSPITAL_COMMUNITY): Payer: Self-pay | Admitting: Physical Therapy

## 2012-02-21 ENCOUNTER — Ambulatory Visit (HOSPITAL_COMMUNITY): Payer: Managed Care, Other (non HMO) | Admitting: Physical Therapy

## 2012-02-23 ENCOUNTER — Inpatient Hospital Stay (HOSPITAL_COMMUNITY)
Admission: RE | Admit: 2012-02-23 | Payer: Managed Care, Other (non HMO) | Source: Ambulatory Visit | Admitting: Physical Therapy

## 2012-02-28 ENCOUNTER — Ambulatory Visit (HOSPITAL_COMMUNITY): Payer: Managed Care, Other (non HMO) | Admitting: Physical Therapy

## 2012-02-29 ENCOUNTER — Ambulatory Visit (HOSPITAL_COMMUNITY): Payer: Managed Care, Other (non HMO) | Admitting: Physical Therapy

## 2012-03-05 ENCOUNTER — Ambulatory Visit (HOSPITAL_COMMUNITY)
Admission: RE | Admit: 2012-03-05 | Discharge: 2012-03-05 | Disposition: A | Discharge status: Home / Self Care | Payer: Managed Care, Other (non HMO) | Source: Ambulatory Visit | Attending: Orthopedic Surgery | Admitting: Orthopedic Surgery

## 2012-03-05 DIAGNOSIS — M6281 Muscle weakness (generalized): Secondary | ICD-10-CM | POA: Insufficient documentation

## 2012-03-05 DIAGNOSIS — IMO0001 Reserved for inherently not codable concepts without codable children: Secondary | ICD-10-CM | POA: Insufficient documentation

## 2012-03-05 DIAGNOSIS — M25669 Stiffness of unspecified knee, not elsewhere classified: Secondary | ICD-10-CM | POA: Insufficient documentation

## 2012-03-05 DIAGNOSIS — M25569 Pain in unspecified knee: Secondary | ICD-10-CM | POA: Insufficient documentation

## 2012-03-05 NOTE — Evaluation (Signed)
Physical Therapy Re-evaluation  Patient Details  Name: Kimberly Love MRN: [id number] Date of Birth: July 12, 1951  Today's Date: 03/05/2012 Time: 1307-1400 PT Time Calculation (min): 53 min  Visit#: 8  of 14   Re-eval: 03/26/12 Assessment Diagnosis: R TKA Surgical Date: 12/26/11 Next MD Visit: Dr. Sherlean Foot - 03/09/12   Past Medical History:  Past Medical History  Diagnosis Date  . Complication of anesthesia     has seizure in 1973  while waking up  . Seizures     x1  . Anxiety   . Depression   . Hypertension     dr Timothy Lasso   halll  [place]  . PONV (postoperative nausea and vomiting)    Past Surgical History:  Past Surgical History  Procedure Date  . Tonsillectomy   . Cholecystectomy   . Breast surgery     bio  2012   neg  . Abdominal hysterectomy   . Orthorscopy     rt knee  . Total knee arthroplasty 12/26/2011    Procedure: TOTAL KNEE ARTHROPLASTY;  Surgeon: Raymon Mutton, MD;  Location: [facility] OR;  Service: Orthopedics;  Laterality: Right;  right total knee arthroplasty    Subjective Symptoms/Limitations Symptoms: Pt has not been to therapy since 02/14/12.  Pt states she has missed due to a fall off her back steps.  Pt slipped on her wet step. How long can you sit comfortably?: Pt states that she is able to sit more than an hour. How long can you stand comfortably?: Pt states that standing does not give her any problems any longer How long can you walk comfortably?: Pt is currently walking without an assistive device.  She is able to walk for an hour now.   Pain Assessment Currently in Pain?: Yes Pain Score: 0-No pain (Pain is a 3 or less 80% of the day) Pain Location: Knee Pain Orientation: Right Pain Type: Surgical pain     Prior Functioning  Prior Function Vocation: Full time employment Vocation Requirements: on feet 8 hrs a day. Leisure: Hobbies-yes (Comment) Comments: day trips    Assessment RLE AROM (degrees) Right Knee Extension: 10  (was  18) Right Knee Flexion: 114  (was 100) RLE PROM (degrees) Right Knee Extension: 5  (was 15) Right Knee Flexion: 117  RLE Strength Right Hip Flexion: 5/5 Right Hip Extension: 3/5 (was 3-/5; L is 3/5 as well) Right Hip ABduction: 5/5 Right Hip ADduction: 4/5 (was 4/5) Right Knee Flexion: 4/5 (was 3-/5; L is 4/5) Right Knee Extension: 5/5 (was 3/5) Right Ankle Dorsiflexion: 5/5 (was 3+/5)  Exercise/Treatments     Supine Quad Sets: 10 reps Heel Slides: 10 reps Knee Extension: PROM Knee Flexion: PROM Sidelying Hip ADduction: 10 reps Prone  Hamstring Curl: 10 reps Hip Extension: 10 reps    Physical Therapy Assessment and Plan PT Assessment and Plan Clinical Impression Statement: Pt has improved but has not been able to come to therapy due to deaths in family and falling.  She will benefit for continued therapy to improve ROM and strengthen hip extensor and knee flexors as well as normalizing gait. Pt will benefit from skilled therapeutic intervention in order to improve on the following deficits: Decreased strength;Difficulty walking;Decreased range of motion Rehab Potential: Good PT Frequency: Min 2X/week PT Duration:  (3 weeks) PT Treatment/Interventions: Gait training;Stair training;Therapeutic exercise PT Plan: work on normalizing gait, ROM and strengthening of hip extensors and knee flexors only.     Goals Home Exercise Program PT Goal: Perform Home  Exercise Program - Progress: Met PT Short Term Goals PT Short Term Goal 1: Pt is walking inside and outside without her cane PT Short Term Goal 1 - Progress: Met PT Short Term Goal 2: PT ROM is -10 to 113  gait is not completely normal but is better PT Short Term Goal 2 - Progress: Progressing toward goal PT Long Term Goals PT Long Term Goal 2 - Progress: Progressing toward goal Long Term Goal 4: Pt strength is improved pt is able to walk for over an hour. PT Long Term Goal 5: Pt is not okayed to RTW  Problem  List Patient Active Problem List  Diagnosis  . Respiratory acidosis  . Acute respiratory failure  . Altered mental status  . Hypoxemia    PT - End of Session Activity Tolerance: Patient tolerated treatment well General Behavior During Session: [facility] for tasks performed Cognition: [facility] for tasks performed PT Plan of Care PT Home Exercise Plan: new ex sheet given Consulted and Agree with Plan of Care: Patient  GP    RUSSELL,CINDY 03/05/2012, 2:02 PM  Physician Documentation Your signature is required to indicate approval of the treatment plan as stated above.  Please sign and either send electronically or make a copy of this report for your files and return this physician signed original.   Please mark one 1.__approve of plan  2. ___approve of plan with the following conditions.   ______________________________                                                          _____________________ Physician Signature                                                                                                             Date

## 2012-03-07 ENCOUNTER — Ambulatory Visit (HOSPITAL_COMMUNITY)
Admission: RE | Admit: 2012-03-07 | Discharge: 2012-03-07 | Disposition: A | Discharge status: Home / Self Care | Payer: Managed Care, Other (non HMO) | Source: Ambulatory Visit | Attending: Internal Medicine | Admitting: Internal Medicine

## 2012-03-07 NOTE — Progress Notes (Signed)
Physical Therapy Treatment Patient Details  Name: SUAN PYEATT MRN: [id number] Date of Birth: 08-09-1951  Today's Date: 03/07/2012 Time: 1401 (Pt. was 23' late for appt.)-1454 PT Time Calculation (min): 53 min  Visit#: 9  of 14   Re-eval: 03/26/12   Subjective:  Symptoms/Limitations Symptoms: PT. states she's been working on her exercises.  Pt. was 6' late for appt. today. Pain Assessment Currently in Pain?: Yes Pain Score:   2   Exercise/Treatments Aerobic Stationary Bike: 6' @ 3.5 seat 8 Machines for Strengthening Cybex Knee Flexion: 2 pl x 10 singles R and L with eccentric return Standing Knee Flexion: 15 reps Knee Flexion Limitations: TKF 6 in step 3# Lateral Step Up: 10 reps;Step Height: 6";Right;Hand Hold: 1 Forward Step Up: 10 reps;Step Height: 6";Right Step Down: 10 reps;Step Height: 6";Hand Hold: 1 Supine Quad Sets: 10 reps Heel Slides: 10 reps Knee Extension: PROM Knee Flexion: PROM Sidelying Hip ADduction: 10 reps;Limitations Hip ADduction Limitations: 3# Prone  Hamstring Curl: 15 reps;Limitations Hamstring Curl Limitations: 3# Hip Extension: 10 reps;Limitations Hip Extension Limitations: 3#     Physical Therapy Assessment and Plan PT Assessment and Plan Clinical Impression Statement: Pt. able to complete exercises without difficulty.  Pt. reactive to PROM today for both motions, only able to achieve 108 degrees for flexion. PT Plan: Continue to focus on glutes and hamstrings while increasing ROM.     Problem List Patient Active Problem List  Diagnosis  . Respiratory acidosis  . Acute respiratory failure  . Altered mental status  . Hypoxemia    PT - End of Session Activity Tolerance: Patient tolerated treatment well General Behavior During Session: [facility] for tasks performed Cognition: [facility] for tasks performed   [name], PTA/CLT 03/07/2012, 3:22 PM

## 2012-03-13 ENCOUNTER — Ambulatory Visit (HOSPITAL_COMMUNITY): Payer: Managed Care, Other (non HMO) | Admitting: *Deleted

## 2012-03-15 ENCOUNTER — Ambulatory Visit (HOSPITAL_COMMUNITY)
Admission: RE | Admit: 2012-03-15 | Discharge: 2012-03-15 | Disposition: A | Discharge status: Home / Self Care | Payer: Managed Care, Other (non HMO) | Source: Ambulatory Visit | Attending: Internal Medicine | Admitting: Internal Medicine

## 2012-03-15 NOTE — Progress Notes (Signed)
Physical Therapy Treatment Patient Details  Name: Kimberly Love MRN: [id number] Date of Birth: 1950/10/08  Today's Date: 03/15/2012 Time: 1438 (8'late)-1522 PT Time Calculation (min): 44 min Visit#: 10  of 12   Re-eval: 03/23/12 Charges:  65' therex    Subjective: Symptoms/Limitations Symptoms: Pt. states she done her exercises today and rode her bike.  Currently 1/10 pain   Exercise/Treatments Aerobic Stationary Bike: did at home Elliptical: 1:25" and had to stop Machines for Strengthening Cybex Knee Extension: 2.5 pl x20 TKF Cybex Knee Flexion: 2.5 pl x 15 singles R and L with eccentric return Standing Lateral Step Up: 15 reps;Step Height: 6";Hand Hold: 1;Right Forward Step Up: 15 reps;Step Height: 6";Hand Hold: 1;Right Step Down: 15 reps;Step Height: 4";Hand Hold: 1;Right Seated Other Seated Knee Exercises: 10 STS no UE's     Physical Therapy Assessment and Plan PT Assessment and Plan Clinical Impression Statement: Able to complete all exercises without difficulty.  AROM 5 degrees for extension, 110 degrees for flexion today. PT Plan: Continue to focus on glutes and hamstrings while increasing ROM.  Add standing hip extension with theraband and TKE's.     Problem List Patient Active Problem List  Diagnosis  . Respiratory acidosis  . Acute respiratory failure  . Altered mental status  . Hypoxemia    PT - End of Session Activity Tolerance: Patient tolerated treatment well General Behavior During Session: [facility] for tasks performed Cognition: [facility] for tasks performed   Kimberly Love, PTA/CLT 03/15/2012, 3:44 PM

## 2012-03-20 ENCOUNTER — Ambulatory Visit (HOSPITAL_COMMUNITY): Payer: Managed Care, Other (non HMO) | Admitting: Physical Therapy

## 2012-03-22 ENCOUNTER — Ambulatory Visit (HOSPITAL_COMMUNITY): Payer: Managed Care, Other (non HMO) | Admitting: Physical Therapy

## 2012-03-27 NOTE — Progress Notes (Signed)
Physical Therapy Treatment Patient Details  Name: RADIAH LUBINSKI MRN: [id number] Date of Birth: 07/27/1951  Today's Date: 03/27/2012 Time: 1191-4782 PT Time Calculation (min): 42 min Charges: 3' TE Visit#: 11  of 12   Re-eval: 04/04/12   Subjective: Symptoms/Limitations Symptoms: She reports she is doing ok today.  He states that her L knee continues to pop.  She and her MD are happy with her ROM, but continues to need strength training.  Pain Assessment Currently in Pain?: No/denies  Exercise/Treatments Aerobic Stationary Bike: 6' @ 4.0 for strength Isokinetic: 150, 120, 90 x10 each Machines for Strengthening Cybex Knee Extension: 3 PL x15 Cybex Knee Flexion: 4 PL x15 Standing Side Lunges: Both;Limitations Side Lunges Limitations: 2 RT w/mini squat Lateral Step Up: Right;15 reps;Step Height: 6" Forward Step Up: Right;20 reps;Step Height: 6" Wall Squat: 15 reps Rocker board: 2 minutes w/o HHA Seated Stool Scoot - Round Trips: 1 RT   Physical Therapy Assessment and Plan PT Assessment and Plan Clinical Impression Statement: Added activities to improve strength and function.  Continues to demonstrate muscular fatigue with activities which require muscular endurance.   PT Plan: Will re-evaluate w/cindy on 04/04/12.  Continue to progress strengthening.      Goals    Problem List Patient Active Problem List  Diagnosis  . Respiratory acidosis  . Acute respiratory failure  . Altered mental status  . Hypoxemia   Aleck Locklin, PT 03/27/2012, 1:59 PM

## 2012-04-02 ENCOUNTER — Ambulatory Visit (HOSPITAL_COMMUNITY): Payer: Managed Care, Other (non HMO) | Admitting: Physical Therapy

## 2012-04-04 ENCOUNTER — Ambulatory Visit (HOSPITAL_COMMUNITY)
Admission: RE | Admit: 2012-04-04 | Discharge: 2012-04-04 | Disposition: A | Discharge status: Home / Self Care | Payer: Managed Care, Other (non HMO) | Source: Ambulatory Visit | Attending: Orthopedic Surgery | Admitting: Orthopedic Surgery

## 2012-04-04 DIAGNOSIS — M25669 Stiffness of unspecified knee, not elsewhere classified: Secondary | ICD-10-CM | POA: Insufficient documentation

## 2012-04-04 DIAGNOSIS — IMO0001 Reserved for inherently not codable concepts without codable children: Secondary | ICD-10-CM | POA: Insufficient documentation

## 2012-04-04 DIAGNOSIS — M25569 Pain in unspecified knee: Secondary | ICD-10-CM | POA: Insufficient documentation

## 2012-04-04 DIAGNOSIS — M6281 Muscle weakness (generalized): Secondary | ICD-10-CM | POA: Insufficient documentation

## 2012-04-04 NOTE — Progress Notes (Signed)
Physical Therapy Re-evaluation  Patient Details  Name: Kimberly Love MRN: [id number] Date of Birth: 1950-11-14  Today's Date: 04/04/2012 Time: 1525-1600 PT Time Calculation (min): 35 min  Visit#: 12  of 12   Re-eval:   Assessment Diagnosis: R TKA Surgical Date: 12/26/11 Next MD Visit: Dr. Sherlean Foot - 04/20/2012 Charge: MMT 1 unit ROM Measurement 1 unit therex 23 min  Subjective Symptoms/Limitations Symptoms: Pt stated she feels unsure if ready to return to work or not but does think so.  Pt does feel like her ROM and strength are improving, was able to clean whole house from top to bottom without pain. How long can you sit comfortably?: Pt states she can sit for 45 mintues to an hour then gets stiff. How long can you stand comfortably?: Pt stantes she can stand for 30 to 45 minutes. How long can you walk comfortably?: Pt walks with no AD, can walk for an hour and half to two hours.  Able to walk through [employer].without difficulty. Pain Assessment Currently in Pain?: No/denies  Objective:   Assessment RLE AROM (degrees) Right Knee Extension: 5  (was 10) Right Knee Flexion: 119  (was 114) RLE PROM (degrees) Right Knee Extension:  (was 5) Right Knee Flexion:  (was 117) RLE Strength Right Hip Flexion: 5/5 Right Hip Extension: 5/5 (was 3/5) Right Hip ABduction: 5/5 Right Hip ADduction: 5/5 (was 4/5) Right Knee Flexion:  (4+/5 was 4/5) Right Knee Extension: 5/5 Right Ankle Dorsiflexion: 5/5  Exercise/Treatments Aerobic Stationary Bike: 8' @ 4.0 for strength Supine Heel Slides: 10 reps;Limitations Heel Slides Limitations: 10 seconds Other Supine Knee Exercises: PROM for ext/flex    Physical Therapy Assessment and Plan PT Assessment and Plan Clinical Impression Statement: Re-evaluation complete.  Kimberly Love has had 12/12 OPPT sessions over 4 weeks with the following findings:  She has met 2/2 STGs, 4/6 LTGs.  Pt independent with HEP and able to perform correct  technique with all exercises.  Pt ambulating with correct gait mechanics with no AD.  ROM increased to lacking 5 to 119 degrees AROM.  Strength has returned to WNL with ability to ambulate for 2 hours and can ride confortably for 2 hours without difficulty .  Pt plans to RTW next week. PT Plan: D/C to HEP.    Goals Home Exercise Program PT Goal: Perform Home Exercise Program - Progress: Met PT Short Term Goals PT Short Term Goal 1: Pt with ambulated without cane in home. PT Short Term Goal 1 - Progress: Met PT Short Term Goal 2: ROM to be -10 to 110 for normalized gait. PT Short Term Goal 2 - Progress: Met PT Long Term Goals PT Long Term Goal 1: ROM to be -3 to 120 PT Long Term Goal 1 - Progress: Progressing toward goal PT Long Term Goal 2: Pain no greater than 2/10 to wean from pain meds.   PT Long Term Goal 2 - Progress: Met Long Term Goal 3: Pt to ambulate outside without the cane Long Term Goal 3 Progress: Met Long Term Goal 4: Strength WNL to ambulate for greater than an hour. Long Term Goal 4 Progress: Met PT Long Term Goal 5: RTW Long Term Goal 5 Progress: Progressing toward goal (RTW on Monday 04/09/2012) PT Long Term Goal 6: Pt to be able to ride comfortable in a car for two hours to allow for short tripsl  goal MET  Problem List Patient Active Problem List  Diagnosis  . Respiratory acidosis  . Acute respiratory failure  .  Altered mental status  . Hypoxemia    PT - End of Session Activity Tolerance: Patient tolerated treatment well General Behavior During Session: [facility] for tasks performed Cognition: [facility] for tasks performed  GP    [name] 04/04/2012, 4:24 PM

## 2012-04-10 ENCOUNTER — Encounter (HOSPITAL_COMMUNITY): Payer: Self-pay | Admitting: *Deleted

## 2012-04-10 ENCOUNTER — Emergency Department (HOSPITAL_COMMUNITY): Payer: Managed Care, Other (non HMO)

## 2012-04-10 ENCOUNTER — Emergency Department (HOSPITAL_COMMUNITY)
Admission: EM | Admit: 2012-04-10 | Discharge: 2012-04-10 | Disposition: A | Discharge status: Home / Self Care | Payer: Managed Care, Other (non HMO) | Attending: Emergency Medicine | Admitting: Emergency Medicine

## 2012-04-10 DIAGNOSIS — F341 Dysthymic disorder: Secondary | ICD-10-CM | POA: Insufficient documentation

## 2012-04-10 DIAGNOSIS — R079 Chest pain, unspecified: Secondary | ICD-10-CM | POA: Insufficient documentation

## 2012-04-10 DIAGNOSIS — F17200 Nicotine dependence, unspecified, uncomplicated: Secondary | ICD-10-CM | POA: Insufficient documentation

## 2012-04-10 DIAGNOSIS — Z9089 Acquired absence of other organs: Secondary | ICD-10-CM | POA: Insufficient documentation

## 2012-04-10 LAB — CBC
HCT: 39.3 % (ref 36.0–46.0)
MCV: 86.2 fL (ref 78.0–100.0)
RBC: 4.56 MIL/uL (ref 3.87–5.11)
RDW: 15.2 % (ref 11.5–15.5)
WBC: 13.1 10*3/uL — ABNORMAL HIGH (ref 4.0–10.5)

## 2012-04-10 LAB — BASIC METABOLIC PANEL
BUN: 20 mg/dL (ref 6–23)
CO2: 27 mEq/L (ref 19–32)
Chloride: 99 mEq/L (ref 96–112)
Creatinine, Ser: 0.84 mg/dL (ref 0.50–1.10)
GFR calc Af Amer: 85 mL/min — ABNORMAL LOW (ref >90)
Potassium: 3.7 mEq/L (ref 3.5–5.1)

## 2012-04-10 LAB — TROPONIN I: Troponin I: <0.3 ng/mL (ref <0.30)

## 2012-04-10 MED ORDER — SODIUM CHLORIDE 0.9 % IV SOLN: Freq: Once | INTRAVENOUS | Status: DC

## 2012-04-10 MED ORDER — NITROGLYCERIN 0.4 MG SL SUBL: 0.4000 mg | SUBLINGUAL_TABLET | SUBLINGUAL | Status: DC | PRN

## 2012-04-10 MED ORDER — IOHEXOL 350 MG/ML SOLN
100.0000 mL | Freq: Once | INTRAVENOUS | Status: AC | PRN
  Administered 2012-04-10: 100 mL via INTRAVENOUS

## 2012-04-10 MED ORDER — ASPIRIN 325 MG PO TABS: 325.0000 mg | ORAL_TABLET | ORAL | Status: DC

## 2012-04-10 NOTE — ED Notes (Signed)
Patient transported to X-ray 

## 2012-04-10 NOTE — ED Provider Notes (Signed)
History   This chart was scribed for Kimberly Lennert, MD by Charolett Bumpers . The patient was seen in room APA14/APA14. Patient's care was started at 1220.    CSN: [id number]  Arrival date & time 04/10/12  1205   First MD Initiated Contact with Patient 04/10/12 1220      Chief Complaint  Patient presents with  . Chest Pain    (Consider location/radiation/quality/duration/timing/severity/associated sxs/prior treatment) HPI Comments: JAONNA Love is a 61 y.o. female who presents to the Emergency Department complaining of constant, moderate left-sided chest pain with an onset of 10 am today. Pt describes the chest pain as pressure and states that it radiates to her left arm. Pt reports associated diaphoresis and SOB. Pt states that her symptoms started with fatigue prior to the chest pain. Pt reports no new leg swelling. Pt reports that she had a total right knee arthroplasty in 11/2011. Pt reports that after complications due to an allergy, she had a full cardiac work up with normal results.   PCP: Dr. Margo Aye  Patient is a 60 y.o. female presenting with chest pain. The history is provided by the patient.  Chest Pain The chest pain began less than 1 hour ago. Chest pain occurs constantly. The chest pain is improving. The severity of the pain is moderate. The quality of the pain is described as pressure-like. The pain radiates to the left arm. Primary symptoms include shortness of breath. Pertinent negatives for primary symptoms include no fatigue, no cough and no abdominal pain.  Associated symptoms include diaphoresis. She tried aspirin for the symptoms.  Pertinent negatives for past medical history include no seizures.      Past Medical History  Diagnosis Date  . Complication of anesthesia     has seizure in 1973  while waking up  . Seizures     x1  . Anxiety   . Depression   . Hypertension     dr Timothy Lasso   halll  [place]  . PONV (postoperative nausea and vomiting)       Past Surgical History  Procedure Date  . Tonsillectomy   . Cholecystectomy   . Breast surgery     bio  2012   neg  . Abdominal hysterectomy   . Orthorscopy     rt knee  . Total knee arthroplasty 12/26/2011    Procedure: TOTAL KNEE ARTHROPLASTY;  Surgeon: Raymon Mutton, MD;  Location: [facility] OR;  Service: Orthopedics;  Laterality: Right;  right total knee arthroplasty    No family history on file.  History  Substance Use Topics  . Smoking status: Current Everyday Smoker -- 0.2 packs/day for 32 years    Types: Cigarettes  . Smokeless tobacco: Not on file  . Alcohol Use: Yes     not in x 5 yrs    OB History    Grav Para Term Preterm Abortions TAB SAB Ect Mult Living                  Review of Systems  Constitutional: Positive for diaphoresis. Negative for fatigue.  HENT: Negative for congestion, sinus pressure and ear discharge.   Eyes: Negative for discharge.  Respiratory: Positive for shortness of breath. Negative for cough.   Cardiovascular: Positive for chest pain. Negative for leg swelling.  Gastrointestinal: Negative for abdominal pain and diarrhea.  Genitourinary: Negative for frequency and hematuria.  Musculoskeletal: Negative for back pain.  Skin: Negative for rash.  Neurological: Negative for seizures and  headaches.  Hematological: Negative.   Psychiatric/Behavioral: Negative for hallucinations.  All other systems reviewed and are negative.    Allergies  Review of patient's allergies indicates no known allergies.  Home Medications   Current Outpatient Rx  Name Route Sig Dispense Refill  . CITALOPRAM HYDROBROMIDE 20 MG PO TABS Oral Take 20 mg by mouth daily.    Marland Kitchen ENOXAPARIN SODIUM 40 MG/0.4ML [place] SOLN Subcutaneous Inject 0.4 mLs (40 mg total) into the skin every 12 (twelve) hours. 10 Syringe 0  . IBUPROFEN 200 MG PO TABS Oral Take 600-800 mg by mouth every 6 (six) hours as needed. For pain    . LISINOPRIL-HYDROCHLOROTHIAZIDE 20-25 MG PO TABS Oral Take  1 tablet by mouth daily.    Marland Kitchen LORAZEPAM 1 MG PO TABS Oral Take 1-3 mg by mouth at bedtime. Depends on pain      BP 103/52  Pulse 79  Temp 97.8 F (36.6 C) (Oral)  Resp 18  Ht 5\' 6"  (1.676 m)  Wt 234 lb (106.142 kg)  BMI 37.77 kg/m2  SpO2 94%  Physical Exam  Constitutional: She is oriented to person, place, and time. She appears well-developed.  HENT:  Head: Normocephalic and atraumatic.  Eyes: Conjunctivae and EOM are normal. No scleral icterus.  Neck: Neck supple. No thyromegaly present.  Cardiovascular: Normal rate, regular rhythm and normal heart sounds.  Exam reveals no gallop and no friction rub.   No murmur heard. Pulmonary/Chest: Effort normal and breath sounds normal. No stridor. No respiratory distress. She has no wheezes. She has no rales. She exhibits no tenderness.  Abdominal: Soft. Bowel sounds are normal. She exhibits no distension. There is no tenderness. There is no rebound.  Musculoskeletal: Normal range of motion. She exhibits edema.       Moderate swelling of right knee.   Lymphadenopathy:    She has no cervical adenopathy.  Neurological: She is oriented to person, place, and time. Coordination normal.  Skin: No rash noted. No erythema.  Psychiatric: She has a normal mood and affect. Her behavior is normal.    ED Course  Procedures (including critical care time)  DIAGNOSTIC STUDIES: Oxygen Saturation is 94% on room air, adequate by my interpretation.    COORDINATION OF CARE:  12:11-Medication Orders: Nitroglycerin (Nitrostat) SL tablet 0.4 mg-every 5 min PRN  12:15-Medication Orders: Aspirin tablet 325 mg-once.   12:26-Discussed planned course of treatment with the patient including chest x-ray and blood work, who is agreeable at this time.   12:30-Medication Orders: 0.9% sodium chloride infusion-once  Results for orders placed during the hospital encounter of 04/10/12  CBC      Component Value Range   WBC 13.1 (*) 4.0 - 10.5 K/uL   RBC 4.56   3.87 - 5.11 MIL/uL   Hemoglobin 13.0  12.0 - 15.0 g/dL   HCT 16.1  09.6 - 04.5 %   MCV 86.2  78.0 - 100.0 fL   MCH 28.5  26.0 - 34.0 pg   MCHC 33.1  30.0 - 36.0 g/dL   RDW 40.9  81.1 - 91.4 %   Platelets 292  150 - 400 K/uL  BASIC METABOLIC PANEL      Component Value Range   Sodium 137  135 - 145 mEq/L   Potassium 3.7  3.5 - 5.1 mEq/L   Chloride 99  96 - 112 mEq/L   CO2 27  19 - 32 mEq/L   Glucose, Bld 121 (*) 70 - 99 mg/dL   BUN 20  6 - 23 mg/dL   Creatinine, Ser 1.61  0.50 - 1.10 mg/dL   Calcium 9.6  8.4 - 09.6 mg/dL   GFR calc non Af Amer 74 (*) >90 mL/min   GFR calc Af Amer 85 (*) >90 mL/min  TROPONIN I      Component Value Range   Troponin I <0.30  <0.30 ng/mL  PRO B NATRIURETIC PEPTIDE      Component Value Range   Pro B Natriuretic peptide (BNP) 198.8 (*) 0 - 125 pg/mL  D-DIMER, QUANTITATIVE      Component Value Range   D-Dimer, Quant 1.07 (*) 0.00 - 0.48 ug/mL-FEU     Dg Chest Port 1 View  04/10/2012  *RADIOLOGY REPORT*  Clinical Data: Smoker.  Chest pain.  Or  PORTABLE CHEST - 1 VIEW  Comparison: 12/29/2011  Findings: Cardiac silhouette is mildly enlarged.  No mediastinal or hilar mass or adenopathy.  Clear lungs.  Bony thorax is intact.  No change.  IMPRESSION: No acute cardiopulmonary disease  Original Report Authenticated By: Domenic Moras, M.D.     No diagnosis found.  Lynett Fish  Date: 04/10/2012  Rate82  Rhythm: normal sinus rhythm  QRS Axis: left  Intervals: normal  ST/T Wave abnormalities: nonspecific ST changes  Conduction Disutrbances:right bundle branch block  Narrative Interpretation:   Old EKG Reviewed: unchanged    MDM     The chart was scribed for me under my direct supervision.  I personally performed the history, physical, and medical decision making and all procedures in the evaluation of this patient.Kimberly Lennert, MD 04/10/12 1556

## 2012-04-10 NOTE — ED Notes (Signed)
Pt here with CP that began at 10am and was associated with pressure under the left breast with radiation to left arm and sob and diaphoresis.  Pt denies any n/v and no increased pain with movement or palpation.  Pt here from work by [employer].  Pt had 325mg  asa PTA, 22g in left hand, EKG WNL

## 2012-04-10 NOTE — ED Notes (Signed)
Attempt IV placement x2 in left arm (LAFA and Left AC) for 20g IV for CT angio.  No success

## 2012-06-25 ENCOUNTER — Encounter (HOSPITAL_COMMUNITY): Payer: Self-pay | Admitting: Pharmacy Technician

## 2012-06-27 ENCOUNTER — Other Ambulatory Visit: Payer: Self-pay | Admitting: Orthopedic Surgery

## 2012-06-27 NOTE — Consult Note (Addendum)
Anesthesia Chart Review:  Patient is a 61 year old female scheduled for a left total knee arthroplasty on 07/02/12 by Dr. Sherlean Foot.  She is s/p a right TKA on 12/26/11 that was complicated by altered mental status, acute renal failure (Cr 0.49 to 1.75), and hypercapnic respiratory failure requiring BPAP in the setting of peri-operative benzodiazepines and narcotics.  I was contacted by Dr. Tobin Chad PA [employer] on 06/27/12.  He wanted me to review her peri-operative course before patient's PAT appointment on 06/28/12.  It sounds like patient had some concerns if or how her "anesthesia" contributed to her respiratory failure and if any changes should be made.  I was out of the office at the time of her PAT visit, and apparently she did not ask to speak with an anesthesiologist at the time of her PAT visit, so I called and spoke with her today.  Her complete anesthesia record is available for review in Epic. General anesthesia with femoral nerve block were utilized.  It required two attempts for intubation with ETT--first attempt unsuccessful with MAC3, second attempt successful with glidescope.  She was extubated post-operatively with positive pressure, transported with oxygen via face mask. On POD # 2 she developed shortness or breath and altered mental status.  She required transfer to telemetry/ICU.   The Hosptialist and Critical Care services were consulted.  She temporarily required NIPPV and narcan drip.  Acute renal failure was felt secondary to dehydration and improved with IV fluid.  She was also treated with Avelox for leukocytosis, but this was ultimately felt secondary to surgical stress and not infection.  CXR on POD #2 showed pulmonary vascular congestion with mild perihilar edema and bibasilar atelectasis.  NM perfusion scan showed very low probability for PE, head CT was normal.  Echo showed normal EF.  Cr was 0.50 by discharge. She was discharged home on POD #4.   Other history includes HTN,  single seizure (in 1973 when awakening from anesthesia), depression, anxiety, smoking, post-operative N/V.  Of note, on 04/10/12 (her second day back to work following recovery from left TKA), she was evaluated at work by EMS for light-headedness, dizziness, and left arm pain.  There was no significant chest pain, nausea, palpitation, diaphoresis, or syncope. She reports that her BP was as low as 60/40 and she was transferred to [facility] ED for further evaluation.  She ruled out for PE by CTA and MI by negative troponin.  She was told that she was probably dehydrated, but otherwise no specific etiology was found for her symptoms that day.  She saw her PCP who did some additional labs, but otherwise did not order further testing.  She has had no additional episodes and was working again up until last week when her right knee began have increased pain.  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 Dwana Melena.   1V CXR on 04/10/12 showed no acute cardiopulmonary disease.  She has not had any new respiratory symptoms, so I think it should be sufficient.  (She had prior 2V CXRs back in April and May 2013.)   CTA of the chest on 04/10/12 showed: No evidence of pulmonary embolism or other acute findings. Mildly prominent right paratracheal lymph nodes without additional lymphadenopathy or pulmonary nodules. This is a nonspecific finding.   EKG on 04/10/12 showed SR with first degree AVB, LAD, right BBB.  Borderline left  axis deviation was new, but overall her EKG was not felt significantly changed from her prior EKGs on 12/28/11 and 12/19/11.    Echo on 12/28/11 showed: - Left ventricle: The cavity size was normal. Wall thickness was increased in a pattern of mild LVH. Systolic function was normal. The estimated ejection fraction was in the range of 60% to 65%. Wall motion was normal; there were no regional wall motion  abnormalities. Doppler parameters are consistent with abnormal left ventricular relaxation (grade 1 diastolic dysfunction). - Aortic valve: There was no stenosis. - Mitral valve: No significant regurgitation. - Left atrium: The atrium was mildly dilated. - Right ventricle: The cavity size was normal. Systolic function was normal. - Pulmonary arteries: No complete TR doppler jet so unable to estimate PA systolic pressure. - Systemic veins: The IVC measured 2.3 cm with some respirophasic variation, suggesting RA pressure 10 mmHg. Impressions: - Normal LV size with mild LV hypertrophy. EF 60-65%. Normal RV size and systolic function. No significant valvular abnormalities.  Labs noted.  Her UA is WNL, but the urine culture is still pending.  She will be evaluated by her assigned anesthesiologist on the day of surgery.  They will review her anesthesia history at that time.  Would anticipate that if there is no significant change in her status that she could proceed as planned.  Dr. Michelle Piper agrees with this plan.  Shonna Chock, PA-C 06/29/12 1230

## 2012-06-28 ENCOUNTER — Encounter (HOSPITAL_COMMUNITY): Payer: Self-pay

## 2012-06-28 ENCOUNTER — Encounter (HOSPITAL_COMMUNITY)
Admission: RE | Admit: 2012-06-28 | Discharge: 2012-06-28 | Disposition: A | Discharge status: Home / Self Care | Payer: Managed Care, Other (non HMO) | Source: Ambulatory Visit | Attending: Orthopedic Surgery | Admitting: Orthopedic Surgery

## 2012-06-28 HISTORY — DX: Unspecified osteoarthritis, unspecified site: M19.90

## 2012-06-28 HISTORY — DX: Chronic kidney disease, unspecified: N18.9

## 2012-06-28 LAB — CBC WITH DIFFERENTIAL/PLATELET
Basophils Absolute: 0 10*3/uL (ref 0.0–0.1)
Eosinophils Relative: 3 % (ref 0–5)
Lymphocytes Relative: 25 % (ref 12–46)
Neutro Abs: 7.9 10*3/uL — ABNORMAL HIGH (ref 1.7–7.7)
Neutrophils Relative %: 67 % (ref 43–77)
Platelets: 279 10*3/uL (ref 150–400)
RDW: 15.1 % (ref 11.5–15.5)
WBC: 11.9 10*3/uL — ABNORMAL HIGH (ref 4.0–10.5)

## 2012-06-28 LAB — COMPREHENSIVE METABOLIC PANEL
ALT: 17 U/L (ref 0–35)
AST: 21 U/L (ref 0–37)
CO2: 27 mEq/L (ref 19–32)
Calcium: 9.6 mg/dL (ref 8.4–10.5)
Chloride: 101 mEq/L (ref 96–112)
GFR calc non Af Amer: >90 mL/min (ref >90)
Potassium: 3.7 mEq/L (ref 3.5–5.1)
Sodium: 138 mEq/L (ref 135–145)

## 2012-06-28 LAB — TYPE AND SCREEN

## 2012-06-28 LAB — URINALYSIS, ROUTINE W REFLEX MICROSCOPIC
Glucose, UA: NEGATIVE mg/dL
Ketones, ur: NEGATIVE mg/dL
Leukocytes, UA: NEGATIVE
pH: 6.5 (ref 5.0–8.0)

## 2012-06-28 LAB — SURGICAL PCR SCREEN: MRSA, PCR: NEGATIVE

## 2012-06-28 LAB — APTT: aPTT: 37 seconds (ref 24–37)

## 2012-06-28 NOTE — Progress Notes (Signed)
Pt. Related experience & need for Bipap post op 12/2011 to medications that she was given.

## 2012-06-28 NOTE — Pre-Procedure Instructions (Signed)
20 LORILEI HORAN  06/28/2012   Your procedure is scheduled on:  07/02/2012  Report to [facility] at 6:45 AM.  Call this number if you have problems the morning of surgery: [phone]   Remember:   Do not eat food or liquid :After Midnight.  SUNDAY    Take these medicines the morning of surgery with A SIP OF WATER: Celexa   Do not wear jewelry, make-up or nail polish.  Do not wear lotions, powders, or perfumes. You may wear deodorant.  Do not shave 48 hours prior to surgery. Men may shave face and neck.  Do not bring valuables to the hospital.  Contacts, dentures or bridgework may not be worn into surgery.  Leave suitcase in the car. After surgery it may be brought to your room.  For patients admitted to the hospital, checkout time is 11:00 AM the day of discharge.   Patients discharged the day of surgery will not be allowed to drive home.  Name and phone number of your driver: /w family  Special Instructions: Shower using CHG 2 nights before surgery and the night before surgery.  If you shower the day of surgery use CHG.  Use special wash - you have one bottle of CHG for all showers.  You should use approximately 1/3 of the bottle for each shower.   Please read over the following fact sheets that you were given: Pain Booklet, Coughing and Deep Breathing, Blood Transfusion Information, MRSA Information and Surgical Site Infection Prevention

## 2012-06-28 NOTE — Progress Notes (Addendum)
03/2012-Pt. Became lightheaded & dizzy, BP low by 1st responders check. Taken by EMS to [facility]- to be checked, told EKG was wnl but had CT of chest- for ? PE, told that CT was good, pt. Sent home. Pt. Seen by Dr. Margo Aye (PCP) the next day & all was OK.

## 2012-06-29 LAB — URINE CULTURE: Colony Count: NO GROWTH

## 2012-07-01 MED ORDER — CEFAZOLIN SODIUM-DEXTROSE 2-3 GM-% IV SOLR
2.0000 g | INTRAVENOUS | Status: AC
  Administered 2012-07-02: 2 g via INTRAVENOUS
  Filled 2012-07-01: qty 50

## 2012-07-02 ENCOUNTER — Encounter (HOSPITAL_COMMUNITY): Payer: Self-pay | Admitting: Vascular Surgery

## 2012-07-02 ENCOUNTER — Encounter (HOSPITAL_COMMUNITY): Payer: Self-pay | Admitting: *Deleted

## 2012-07-02 ENCOUNTER — Ambulatory Visit (HOSPITAL_COMMUNITY): Payer: Managed Care, Other (non HMO) | Admitting: Vascular Surgery

## 2012-07-02 ENCOUNTER — Inpatient Hospital Stay (HOSPITAL_COMMUNITY)
Admission: RE | Admit: 2012-07-02 | Discharge: 2012-07-04 | DRG: 470 | Disposition: A | Discharge status: Home / Self Care | Payer: Managed Care, Other (non HMO) | Source: Ambulatory Visit | Attending: Orthopedic Surgery | Admitting: Orthopedic Surgery

## 2012-07-02 ENCOUNTER — Encounter (HOSPITAL_COMMUNITY)
Admission: RE | Disposition: A | Discharge status: Home / Self Care | Payer: Self-pay | Source: Ambulatory Visit | Attending: Orthopedic Surgery

## 2012-07-02 DIAGNOSIS — F17200 Nicotine dependence, unspecified, uncomplicated: Secondary | ICD-10-CM | POA: Diagnosis present

## 2012-07-02 DIAGNOSIS — I129 Hypertensive chronic kidney disease with stage 1 through stage 4 chronic kidney disease, or unspecified chronic kidney disease: Secondary | ICD-10-CM | POA: Diagnosis present

## 2012-07-02 DIAGNOSIS — N189 Chronic kidney disease, unspecified: Secondary | ICD-10-CM | POA: Diagnosis present

## 2012-07-02 DIAGNOSIS — F3289 Other specified depressive episodes: Secondary | ICD-10-CM | POA: Diagnosis present

## 2012-07-02 DIAGNOSIS — Z96659 Presence of unspecified artificial knee joint: Secondary | ICD-10-CM

## 2012-07-02 DIAGNOSIS — F329 Major depressive disorder, single episode, unspecified: Secondary | ICD-10-CM | POA: Diagnosis present

## 2012-07-02 DIAGNOSIS — F411 Generalized anxiety disorder: Secondary | ICD-10-CM | POA: Diagnosis present

## 2012-07-02 DIAGNOSIS — M1712 Unilateral primary osteoarthritis, left knee: Secondary | ICD-10-CM

## 2012-07-02 DIAGNOSIS — Z79899 Other long term (current) drug therapy: Secondary | ICD-10-CM

## 2012-07-02 DIAGNOSIS — D62 Acute posthemorrhagic anemia: Secondary | ICD-10-CM | POA: Diagnosis not present

## 2012-07-02 DIAGNOSIS — M1710 Unilateral primary osteoarthritis, unspecified knee: Principal | ICD-10-CM | POA: Diagnosis present

## 2012-07-02 HISTORY — PX: TOTAL KNEE ARTHROPLASTY: SHX125

## 2012-07-02 SURGERY — ARTHROPLASTY, KNEE, TOTAL
Anesthesia: General | Site: Knee | Laterality: Left | Wound class: Clean

## 2012-07-02 MED ORDER — CHLORHEXIDINE GLUCONATE 4 % EX LIQD: 60.0000 mL | Freq: Once | CUTANEOUS | Status: DC

## 2012-07-02 MED ORDER — ROCURONIUM BROMIDE 100 MG/10ML IV SOLN
INTRAVENOUS | Status: DC | PRN
  Administered 2012-07-02: 20 mg via INTRAVENOUS

## 2012-07-02 MED ORDER — SODIUM CHLORIDE 0.9 % IR SOLN
Status: DC | PRN
  Administered 2012-07-02: 1000 mL
  Administered 2012-07-02: 3000 mL

## 2012-07-02 MED ORDER — METHOCARBAMOL 100 MG/ML IJ SOLN
500.0000 mg | INTRAVENOUS | Status: AC
  Administered 2012-07-02: 500 mg via INTRAVENOUS
  Filled 2012-07-02: qty 5

## 2012-07-02 MED ORDER — ONDANSETRON HCL 4 MG/2ML IJ SOLN: 4.0000 mg | Freq: Four times a day (QID) | INTRAMUSCULAR | Status: DC | PRN

## 2012-07-02 MED ORDER — BUPIVACAINE ON-Q PAIN PUMP (FOR ORDER SET NO CHG)
INJECTION | Status: DC
  Filled 2012-07-02: qty 1

## 2012-07-02 MED ORDER — METOCLOPRAMIDE HCL 5 MG/ML IJ SOLN
5.0000 mg | Freq: Three times a day (TID) | INTRAMUSCULAR | Status: DC | PRN
Start: 2012-07-02 — End: 2012-07-04

## 2012-07-02 MED ORDER — BUPIVACAINE-EPINEPHRINE PF 0.5-1:200000 % IJ SOLN
INTRAMUSCULAR | Status: DC | PRN
  Administered 2012-07-02: 25 mL

## 2012-07-02 MED ORDER — CELECOXIB 200 MG PO CAPS
200.0000 mg | ORAL_CAPSULE | Freq: Two times a day (BID) | ORAL | Status: DC
  Administered 2012-07-02 – 2012-07-04 (×4): 200 mg via ORAL
  Filled 2012-07-02 (×7): qty 1

## 2012-07-02 MED ORDER — MIDAZOLAM HCL 2 MG/2ML IJ SOLN
INTRAMUSCULAR | Status: AC
  Filled 2012-07-02: qty 2

## 2012-07-02 MED ORDER — GLYCOPYRROLATE 0.2 MG/ML IJ SOLN
INTRAMUSCULAR | Status: DC | PRN
  Administered 2012-07-02: .4 mg via INTRAVENOUS

## 2012-07-02 MED ORDER — METHOCARBAMOL 100 MG/ML IJ SOLN
500.0000 mg | Freq: Four times a day (QID) | INTRAVENOUS | Status: DC | PRN
  Filled 2012-07-02: qty 5

## 2012-07-02 MED ORDER — BUPIVACAINE 0.25 % ON-Q PUMP SINGLE CATH 300ML
300.0000 mL | INJECTION | Status: DC
  Filled 2012-07-02: qty 300

## 2012-07-02 MED ORDER — NEOSTIGMINE METHYLSULFATE 1 MG/ML IJ SOLN
INTRAMUSCULAR | Status: DC | PRN
  Administered 2012-07-02: 3 mg via INTRAVENOUS

## 2012-07-02 MED ORDER — FENTANYL CITRATE 0.05 MG/ML IJ SOLN
50.0000 ug | Freq: Once | INTRAMUSCULAR | Status: AC
  Administered 2012-07-02: 100 ug via INTRAVENOUS

## 2012-07-02 MED ORDER — BISACODYL 5 MG PO TBEC: 5.0000 mg | DELAYED_RELEASE_TABLET | Freq: Every day | ORAL | Status: DC | PRN

## 2012-07-02 MED ORDER — HYDROCHLOROTHIAZIDE 25 MG PO TABS
25.0000 mg | ORAL_TABLET | Freq: Every day | ORAL | Status: DC
  Administered 2012-07-03 – 2012-07-04 (×2): 25 mg via ORAL
  Filled 2012-07-02 (×4): qty 1

## 2012-07-02 MED ORDER — SUCCINYLCHOLINE CHLORIDE 20 MG/ML IJ SOLN
INTRAMUSCULAR | Status: DC | PRN
  Administered 2012-07-02: 120 mg via INTRAVENOUS

## 2012-07-02 MED ORDER — ACETAMINOPHEN 10 MG/ML IV SOLN: 1000.0000 mg | Freq: Four times a day (QID) | INTRAVENOUS | Status: DC

## 2012-07-02 MED ORDER — BUPIVACAINE-EPINEPHRINE PF 0.25-1:200000 % IJ SOLN
INTRAMUSCULAR | Status: AC
  Filled 2012-07-02: qty 30

## 2012-07-02 MED ORDER — MENTHOL 3 MG MT LOZG: 1.0000 | LOZENGE | OROMUCOSAL | Status: DC | PRN

## 2012-07-02 MED ORDER — LISINOPRIL-HYDROCHLOROTHIAZIDE 20-25 MG PO TABS: 1.0000 | ORAL_TABLET | Freq: Every day | ORAL | Status: DC

## 2012-07-02 MED ORDER — LISINOPRIL 20 MG PO TABS
20.0000 mg | ORAL_TABLET | Freq: Every day | ORAL | Status: DC
  Administered 2012-07-03 – 2012-07-04 (×2): 20 mg via ORAL
  Filled 2012-07-02 (×5): qty 1

## 2012-07-02 MED ORDER — FENTANYL CITRATE 0.05 MG/ML IJ SOLN
INTRAMUSCULAR | Status: AC
  Filled 2012-07-02: qty 2

## 2012-07-02 MED ORDER — ZOLPIDEM TARTRATE 5 MG PO TABS: 5.0000 mg | ORAL_TABLET | Freq: Every evening | ORAL | Status: DC | PRN

## 2012-07-02 MED ORDER — METHOCARBAMOL 500 MG PO TABS
500.0000 mg | ORAL_TABLET | Freq: Four times a day (QID) | ORAL | Status: DC | PRN
  Administered 2012-07-02 – 2012-07-04 (×5): 500 mg via ORAL
  Filled 2012-07-02 (×5): qty 1

## 2012-07-02 MED ORDER — DOCUSATE SODIUM 100 MG PO CAPS
100.0000 mg | ORAL_CAPSULE | Freq: Two times a day (BID) | ORAL | Status: DC
  Administered 2012-07-02 – 2012-07-04 (×5): 100 mg via ORAL
  Filled 2012-07-02 (×6): qty 1

## 2012-07-02 MED ORDER — SODIUM CHLORIDE 0.9 % IV SOLN: INTRAVENOUS | Status: DC

## 2012-07-02 MED ORDER — SODIUM CHLORIDE 0.9 % IV SOLN
INTRAVENOUS | Status: DC
  Administered 2012-07-02: 21:00:00 via INTRAVENOUS

## 2012-07-02 MED ORDER — METOCLOPRAMIDE HCL 10 MG PO TABS: 5.0000 mg | ORAL_TABLET | Freq: Three times a day (TID) | ORAL | Status: DC | PRN

## 2012-07-02 MED ORDER — EPHEDRINE SULFATE 50 MG/ML IJ SOLN
INTRAMUSCULAR | Status: DC | PRN
  Administered 2012-07-02: 10 mg via INTRAVENOUS

## 2012-07-02 MED ORDER — OXYCODONE HCL 5 MG/5ML PO SOLN: 5.0000 mg | Freq: Once | ORAL | Status: DC | PRN

## 2012-07-02 MED ORDER — FENTANYL CITRATE 0.05 MG/ML IJ SOLN
INTRAMUSCULAR | Status: DC | PRN
  Administered 2012-07-02: 50 ug via INTRAVENOUS
  Administered 2012-07-02 (×2): 100 ug via INTRAVENOUS

## 2012-07-02 MED ORDER — ENOXAPARIN SODIUM 30 MG/0.3ML ~~LOC~~ SOLN
30.0000 mg | Freq: Two times a day (BID) | SUBCUTANEOUS | Status: DC
  Administered 2012-07-03 – 2012-07-04 (×3): 30 mg via SUBCUTANEOUS
  Filled 2012-07-02 (×5): qty 0.3

## 2012-07-02 MED ORDER — MIDAZOLAM HCL 2 MG/2ML IJ SOLN
1.0000 mg | INTRAMUSCULAR | Status: DC | PRN
  Administered 2012-07-02: 2 mg via INTRAVENOUS

## 2012-07-02 MED ORDER — ACETAMINOPHEN 650 MG RE SUPP: 650.0000 mg | Freq: Four times a day (QID) | RECTAL | Status: DC | PRN

## 2012-07-02 MED ORDER — DIPHENHYDRAMINE HCL 12.5 MG/5ML PO ELIX: 12.5000 mg | ORAL_SOLUTION | ORAL | Status: DC | PRN

## 2012-07-02 MED ORDER — HYDROMORPHONE HCL PF 1 MG/ML IJ SOLN
0.2500 mg | INTRAMUSCULAR | Status: DC | PRN
  Administered 2012-07-02 (×4): 0.5 mg via INTRAVENOUS

## 2012-07-02 MED ORDER — PROMETHAZINE HCL 25 MG/ML IJ SOLN: 6.2500 mg | INTRAMUSCULAR | Status: DC | PRN

## 2012-07-02 MED ORDER — CEFAZOLIN SODIUM-DEXTROSE 2-3 GM-% IV SOLR
2.0000 g | Freq: Four times a day (QID) | INTRAVENOUS | Status: AC
  Administered 2012-07-02 (×2): 2 g via INTRAVENOUS
  Filled 2012-07-02 (×2): qty 50

## 2012-07-02 MED ORDER — CITALOPRAM HYDROBROMIDE 20 MG PO TABS
20.0000 mg | ORAL_TABLET | Freq: Every day | ORAL | Status: DC
  Administered 2012-07-03 – 2012-07-04 (×2): 20 mg via ORAL
  Filled 2012-07-02 (×2): qty 1

## 2012-07-02 MED ORDER — HYDROMORPHONE HCL PF 1 MG/ML IJ SOLN
0.5000 mg | INTRAMUSCULAR | Status: DC | PRN
  Administered 2012-07-02 – 2012-07-03 (×5): 0.5 mg via INTRAVENOUS
  Filled 2012-07-02 (×5): qty 1

## 2012-07-02 MED ORDER — SENNOSIDES-DOCUSATE SODIUM 8.6-50 MG PO TABS: 1.0000 | ORAL_TABLET | Freq: Every evening | ORAL | Status: DC | PRN

## 2012-07-02 MED ORDER — ONDANSETRON HCL 4 MG/2ML IJ SOLN
INTRAMUSCULAR | Status: DC | PRN
  Administered 2012-07-02: 4 mg via INTRAVENOUS

## 2012-07-02 MED ORDER — LORAZEPAM 1 MG PO TABS
1.0000 mg | ORAL_TABLET | Freq: Every day | ORAL | Status: DC
  Administered 2012-07-03: 2 mg via ORAL
  Filled 2012-07-02 (×2): qty 2

## 2012-07-02 MED ORDER — BUPIVACAINE-EPINEPHRINE 0.25% -1:200000 IJ SOLN
INTRAMUSCULAR | Status: DC | PRN
  Administered 2012-07-02: 20 mL

## 2012-07-02 MED ORDER — TRAMADOL HCL 50 MG PO TABS
50.0000 mg | ORAL_TABLET | Freq: Four times a day (QID) | ORAL | Status: DC | PRN
  Administered 2012-07-02: 50 mg via ORAL
  Administered 2012-07-02: 100 mg via ORAL
  Administered 2012-07-02: 50 mg via ORAL
  Administered 2012-07-03 – 2012-07-04 (×5): 100 mg via ORAL
  Filled 2012-07-02 (×2): qty 2
  Filled 2012-07-02 (×2): qty 1
  Filled 2012-07-02 (×5): qty 2

## 2012-07-02 MED ORDER — FLEET ENEMA 7-19 GM/118ML RE ENEM: 1.0000 | ENEMA | Freq: Once | RECTAL | Status: AC | PRN

## 2012-07-02 MED ORDER — LACTATED RINGERS IV SOLN
INTRAVENOUS | Status: DC
  Administered 2012-07-02: 09:00:00 via INTRAVENOUS

## 2012-07-02 MED ORDER — HYDROMORPHONE HCL PF 1 MG/ML IJ SOLN
INTRAMUSCULAR | Status: AC
  Filled 2012-07-02: qty 1

## 2012-07-02 MED ORDER — OXYCODONE HCL 5 MG PO TABS: 5.0000 mg | ORAL_TABLET | Freq: Once | ORAL | Status: DC | PRN

## 2012-07-02 MED ORDER — LIDOCAINE HCL (CARDIAC) 20 MG/ML IV SOLN
INTRAVENOUS | Status: DC | PRN
  Administered 2012-07-02: 100 mg via INTRAVENOUS

## 2012-07-02 MED ORDER — ACETAMINOPHEN 325 MG PO TABS
650.0000 mg | ORAL_TABLET | Freq: Four times a day (QID) | ORAL | Status: DC | PRN
  Administered 2012-07-03 – 2012-07-04 (×2): 650 mg via ORAL
  Filled 2012-07-02 (×2): qty 2

## 2012-07-02 MED ORDER — PROPOFOL 10 MG/ML IV BOLUS
INTRAVENOUS | Status: DC | PRN
  Administered 2012-07-02: 170 mg via INTRAVENOUS

## 2012-07-02 MED ORDER — DEXAMETHASONE SODIUM PHOSPHATE 4 MG/ML IJ SOLN
INTRAMUSCULAR | Status: DC | PRN
  Administered 2012-07-02: 4 mg via INTRAVENOUS

## 2012-07-02 MED ORDER — LACTATED RINGERS IV SOLN
INTRAVENOUS | Status: DC | PRN
  Administered 2012-07-02 (×2): via INTRAVENOUS

## 2012-07-02 MED ORDER — ARTIFICIAL TEARS OP OINT
TOPICAL_OINTMENT | OPHTHALMIC | Status: DC | PRN
  Administered 2012-07-02: 1 via OPHTHALMIC

## 2012-07-02 MED ORDER — ACETAMINOPHEN 10 MG/ML IV SOLN
1000.0000 mg | Freq: Four times a day (QID) | INTRAVENOUS | Status: AC
  Administered 2012-07-02 – 2012-07-03 (×4): 1000 mg via INTRAVENOUS
  Filled 2012-07-02 (×6): qty 100

## 2012-07-02 MED ORDER — ONDANSETRON HCL 4 MG PO TABS: 4.0000 mg | ORAL_TABLET | Freq: Four times a day (QID) | ORAL | Status: DC | PRN

## 2012-07-02 MED ORDER — BUPIVACAINE 0.25 % ON-Q PUMP SINGLE CATH 300ML
INJECTION | Status: DC | PRN
  Administered 2012-07-02: 300 mL

## 2012-07-02 MED ORDER — PHENOL 1.4 % MT LIQD: 1.0000 | OROMUCOSAL | Status: DC | PRN

## 2012-07-02 MED ORDER — IBUPROFEN 600 MG PO TABS
600.0000 mg | ORAL_TABLET | Freq: Three times a day (TID) | ORAL | Status: DC
Start: 2012-07-02 — End: 2012-07-02

## 2012-07-02 SURGICAL SUPPLY — 61 items
BANDAGE ESMARK 6X9 LF (GAUZE/BANDAGES/DRESSINGS) ×1 IMPLANT
BLADE SAGITTAL 13X1.27X60 (BLADE) ×2 IMPLANT
BLADE SAW SGTL 83.5X18.5 (BLADE) ×2 IMPLANT
BNDG ESMARK 6X9 LF (GAUZE/BANDAGES/DRESSINGS) ×2
BOWL SMART MIX CTS (DISPOSABLE) ×2 IMPLANT
CATH KIT ON Q 5IN SLV (PAIN MANAGEMENT) ×2 IMPLANT
CEMENT BONE SIMPLEX SPEEDSET (Cement) ×6 IMPLANT
CLOTH BEACON ORANGE TIMEOUT ST (SAFETY) ×2 IMPLANT
COVER BACK TABLE 24X17X13 BIG (DRAPES) IMPLANT
COVER SURGICAL LIGHT HANDLE (MISCELLANEOUS) ×2 IMPLANT
CUFF TOURNIQUET SINGLE 34IN LL (TOURNIQUET CUFF) ×2 IMPLANT
DRAPE EXTREMITY T 121X128X90 (DRAPE) ×2 IMPLANT
DRAPE INCISE IOBAN 66X45 STRL (DRAPES) ×4 IMPLANT
DRAPE PROXIMA HALF (DRAPES) ×2 IMPLANT
DRAPE U-SHAPE 47X51 STRL (DRAPES) ×2 IMPLANT
DRSG ADAPTIC 3X8 NADH LF (GAUZE/BANDAGES/DRESSINGS) IMPLANT
DRSG PAD ABDOMINAL 8X10 ST (GAUZE/BANDAGES/DRESSINGS) IMPLANT
DURAPREP 26ML APPLICATOR (WOUND CARE) ×4 IMPLANT
ELECT REM PT RETURN 9FT ADLT (ELECTROSURGICAL) ×2
ELECTRODE REM PT RTRN 9FT ADLT (ELECTROSURGICAL) ×1 IMPLANT
EVACUATOR 1/8 PVC DRAIN (DRAIN) ×2 IMPLANT
FEMORAL COMPONENT (Orthopedic Implant) IMPLANT
GLOVE BIOGEL M 7.0 STRL (GLOVE) ×2 IMPLANT
GLOVE BIOGEL PI IND STRL 7.0 (GLOVE) ×1 IMPLANT
GLOVE BIOGEL PI IND STRL 7.5 (GLOVE) ×1 IMPLANT
GLOVE BIOGEL PI IND STRL 8.5 (GLOVE) IMPLANT
GLOVE BIOGEL PI INDICATOR 7.0 (GLOVE) ×1
GLOVE BIOGEL PI INDICATOR 7.5 (GLOVE) ×1
GLOVE BIOGEL PI INDICATOR 8.5 (GLOVE)
GLOVE BIOGEL PI ORTHO PRO SZ8 (GLOVE) ×1
GLOVE PI ORTHO PRO STRL SZ8 (GLOVE) ×1 IMPLANT
GLOVE SURG ORTHO 8.0 STRL STRW (GLOVE) ×4 IMPLANT
GLOVE SURG SS PI 6.5 STRL IVOR (GLOVE) ×6 IMPLANT
GOWN PREVENTION PLUS XLARGE (GOWN DISPOSABLE) ×6 IMPLANT
GOWN STRL NON-REIN LRG LVL3 (GOWN DISPOSABLE) IMPLANT
HANDPIECE INTERPULSE COAX TIP (DISPOSABLE) ×1
HOOD PEEL AWAY FACE SHEILD DIS (HOOD) ×6 IMPLANT
KIT BASIN OR (CUSTOM PROCEDURE TRAY) ×2 IMPLANT
KIT ROOM TURNOVER OR (KITS) ×2 IMPLANT
MANIFOLD NEPTUNE II (INSTRUMENTS) ×2 IMPLANT
NEEDLE 22X1 1/2 (OR ONLY) (NEEDLE) IMPLANT
NS IRRIG 1000ML POUR BTL (IV SOLUTION) ×2 IMPLANT
PACK TOTAL JOINT (CUSTOM PROCEDURE TRAY) ×2 IMPLANT
PAD ARMBOARD 7.5X6 YLW CONV (MISCELLANEOUS) ×2 IMPLANT
PADDING CAST COTTON 6X4 STRL (CAST SUPPLIES) IMPLANT
POSITIONER HEAD PRONE TRACH (MISCELLANEOUS) ×2 IMPLANT
SET HNDPC FAN SPRY TIP SCT (DISPOSABLE) ×1 IMPLANT
SPONGE GAUZE 4X4 12PLY (GAUZE/BANDAGES/DRESSINGS) IMPLANT
STAPLER VISISTAT 35W (STAPLE) ×2 IMPLANT
SUCTION FRAZIER TIP 10 FR DISP (SUCTIONS) ×2 IMPLANT
SUT BONE WAX W31G (SUTURE) ×2 IMPLANT
SUT VIC AB 0 CTB1 27 (SUTURE) ×4 IMPLANT
SUT VIC AB 1 CT1 27 (SUTURE) ×4
SUT VIC AB 1 CT1 27XBRD ANBCTR (SUTURE) ×4 IMPLANT
SUT VIC AB 2-0 CT1 27 (SUTURE) ×2
SUT VIC AB 2-0 CT1 TAPERPNT 27 (SUTURE) ×2 IMPLANT
SYR CONTROL 10ML LL (SYRINGE) IMPLANT
TOWEL OR 17X24 6PK STRL BLUE (TOWEL DISPOSABLE) ×2 IMPLANT
TOWEL OR 17X26 10 PK STRL BLUE (TOWEL DISPOSABLE) ×2 IMPLANT
TRAY FOLEY CATH 14FR (SET/KITS/TRAYS/PACK) ×2 IMPLANT
WATER STERILE IRR 1000ML POUR (IV SOLUTION) ×4 IMPLANT

## 2012-07-02 NOTE — Progress Notes (Signed)
Physical Therapy Evaluation Patient Details Name: Kimberly Love MRN: [id number] DOB: 07/23/1951 Today's Date: 07/02/2012 Time: 2956-2130 PT Time Calculation (min): 25 min  PT Assessment / Plan / Recommendation Clinical Impression  Pt is 61 y/o female admitted for s/p left TKA.  Pt limited on evaluation due to nausea and dizziness however POD 0.  Pt will benefit from acute PT services to improve overall mobility to prepare for safe d/c home with husband.    PT Assessment  Patient needs continued PT services    Follow Up Recommendations  Home health PT;Supervision/Assistance - 24 hour    Does the patient have the potential to tolerate intense rehabilitation      Barriers to Discharge None      Equipment Recommendations  None recommended by PT    Recommendations for Other Services  (none)   Frequency 7X/week    Precautions / Restrictions Precautions Precautions: Fall Restrictions Weight Bearing Restrictions: Yes LLE Weight Bearing: Weight bearing as tolerated   Pertinent Vitals/Pain No c/o pain      Mobility  Bed Mobility Bed Mobility: Supine to Sit;Sit to Supine Supine to Sit: 4: Min assist;HOB flat Sit to Supine: 4: Min assist;HOB flat Details for Bed Mobility Assistance: (A) with Left LE in/out of bed with cues for proper technique. Transfers Transfers: Not assessed (Pt limited due to nausea and dizziness.) Ambulation/Gait Ambulation/Gait Assistance: Not tested (comment)    Shoulder Instructions     Exercises Total Joint Exercises Ankle Circles/Pumps: AAROM;Both Quad Sets: AAROM;Strengthening;Left;5 reps Heel Slides: Strengthening;Left;5 reps;AAROM   PT Diagnosis: Difficulty walking;Abnormality of gait;Acute pain  PT Problem List: Decreased strength;Decreased range of motion;Decreased activity tolerance;Decreased balance;Decreased knowledge of use of DME;Pain PT Treatment Interventions: DME instruction;Gait training;Stair training;Functional mobility  training;Therapeutic activities;Therapeutic exercise;Balance training;Neuromuscular re-education;Patient/family education   PT Goals Acute Rehab PT Goals PT Goal Formulation: With patient Time For Goal Achievement: 07/09/12 Potential to Achieve Goals: Good Pt will go Supine/Side to Sit: with modified independence PT Goal: Supine/Side to Sit - Progress: Goal set today Pt will go Sit to Supine/Side: with modified independence PT Goal: Sit to Supine/Side - Progress: Goal set today Pt will go Sit to Stand: with modified independence PT Goal: Sit to Stand - Progress: Goal set today Pt will go Stand to Sit: with modified independence PT Goal: Stand to Sit - Progress: Goal set today Pt will Ambulate: >150 feet;with modified independence;with rolling walker PT Goal: Ambulate - Progress: Goal set today Pt will Go Up / Down Stairs: 1-2 stairs;with min assist;with rolling walker PT Goal: Up/Down Stairs - Progress: Goal set today Pt will Perform Home Exercise Program: Independently PT Goal: Perform Home Exercise Program - Progress: Goal set today  Visit Information  Last PT Received On: 07/02/12    Subjective Data  Subjective: "I'm feeling a nauseated and dizzy."  (When pt sat EOB). 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.) Communication Communication: No difficulties Dominant Hand: Right      Cognition  Overall Cognitive Status: Appears within functional  limits for tasks assessed/performed Arousal/Alertness: Awake/alert Orientation Level: Appears intact for tasks assessed Behavior During Session: [facility] for tasks performed    Extremity/Trunk Assessment Right Upper Extremity Assessment RUE ROM/Strength/Tone: Within functional levels Left Upper Extremity Assessment LUE ROM/Strength/Tone: Within functional levels Right Lower Extremity Assessment RLE ROM/Strength/Tone: Within functional levels Left Lower Extremity Assessment LLE ROM/Strength/Tone: Unable to fully assess;Due to pain;Deficits LLE ROM/Strength/Tone Deficits: Limited AAROM knee flexion 0-90   Balance Balance Balance Assessed: Yes  End of Session PT - End of Session Equipment Utilized During Treatment: Gait belt Activity Tolerance:  (Limited due to nausea and dizziness) Patient left: in bed;with call bell/phone within reach Nurse Communication: Mobility status CPM Left Knee CPM Left Knee: Off Left Knee Flexion (Degrees): 90  Left Knee Extension (Degrees): 0  Additional Comments: completed 2.5 hours  GP     Hezzie Karim 07/02/2012, 3:23 PM Jake Shark, PT DPT [phone]

## 2012-07-02 NOTE — Anesthesia Preprocedure Evaluation (Addendum)
Anesthesia Evaluation  Patient identified by MRN, date of birth, ID band Patient awake    Reviewed: Allergy & Precautions, H&P , NPO status , Patient's Chart, lab work & pertinent test results  History of Anesthesia Complications (+) PONV  Airway Mallampati: II TM Distance: >3 FB Neck ROM: Full    Dental   Pulmonary Current Smoker,  breath sounds clear to auscultation  Pulmonary exam normal       Cardiovascular hypertension, Pt. on medications Rhythm:Regular Rate:Normal     Neuro/Psych Seizures -,  PSYCHIATRIC DISORDERS Anxiety Depression    GI/Hepatic   Endo/Other  Morbid obesity  Renal/GU      Musculoskeletal   Abdominal (+) + obese,   Peds  Hematology   Anesthesia Other Findings   Reproductive/Obstetrics                          Anesthesia Physical Anesthesia Plan  ASA: III  Anesthesia Plan: General   Post-op Pain Management:    Induction:   Airway Management Planned: Oral ETT  Additional Equipment:   Intra-op Plan:   Post-operative Plan: Extubation in OR  Informed Consent: I have reviewed the patients History and Physical, chart, labs and discussed the procedure including the risks, benefits and alternatives for the proposed anesthesia with the patient or authorized representative who has indicated his/her understanding and acceptance.     Plan Discussed with: CRNA and Surgeon  Anesthesia Plan Comments:         Anesthesia Quick Evaluation

## 2012-07-02 NOTE — Anesthesia Procedure Notes (Signed)
Anesthesia Regional Block:  Femoral nerve block  Pre-Anesthetic Checklist: ,, timeout performed, Correct Patient, Correct Site, Correct Laterality, Correct Procedure, Correct Position, site marked, Risks and benefits discussed,  Surgical consent,  Pre-op evaluation,  At surgeon's request and post-op pain management  Laterality: Left  Prep: chloraprep       Needles:  Injection technique: Single-shot  Needle Type: Stimulator Needle - 80     Needle Length: 8cm  Needle Gauge: 22 and 22 G    Additional Needles:  Procedures: nerve stimulator Femoral nerve block  Nerve Stimulator or Paresthesia:  Response: 0.48 mA,   Additional Responses:   Narrative:  Start time: 07/02/2012 8:45 AM End time: 07/02/2012 8:54 AM Injection made incrementally with aspirations every 5 mL. Anesthesiologist: Dr Gypsy Balsam  Additional Notes: 1610-9604 L FNB POP CHG prep, sterile tech #22 stim needle w/stim down to .48ma Multiple neg asp Marc .5% w/epi 25cc+ decadron 4mg  infiltrated No compl Dr Gypsy Balsam

## 2012-07-02 NOTE — Transfer of Care (Signed)
Immediate Anesthesia Transfer of Care Note  Patient: Kimberly Love  Procedure(s) Performed: Procedure(s) (LRB) with comments: TOTAL KNEE ARTHROPLASTY (Left) - left total knee replacement  Patient Location: PACU  Anesthesia Type:General  Level of Consciousness: sedated  Airway & Oxygen Therapy: Patient Spontanous Breathing and Patient connected to face mask oxygen  Post-op Assessment: Report given to PACU RN and Post -op Vital signs reviewed and stable  Post vital signs: Reviewed and stable  Complications: No apparent anesthesia complications

## 2012-07-02 NOTE — H&P (Signed)
CLEOTA DONAWAY MRN:  [id number] DOB/SEX:  1951-05-14/female  CHIEF COMPLAINT:  Painful left Knee  HISTORY: Patient is a 61 y.o. female presented with a history of pain in the left knee. Onset of symptoms was gradual starting several years ago with gradually worsening course since that time. The patient noted no past surgery on the left knee. Prior procedures on the knee include arthroscopy. Patient has been treated conservatively with over-the-counter NSAIDs and activity modification. Patient currently rates pain in the knee at 9 out of 10 with activity. There is pain at night.  PAST MEDICAL HISTORY: Patient Active Problem List   Diagnosis Date Noted  . Respiratory acidosis 12/28/2011  . Acute respiratory failure 12/28/2011  . Altered mental status 12/28/2011  . Hypoxemia 12/28/2011   Past Medical History  Diagnosis Date  . Anxiety   . Depression   . Hypertension     dr Timothy Lasso   [facility]  [place]  . Chronic kidney disease   . Seizures     x1- 1973- post childbirth   . Arthritis     OA- L knee   . Complication of anesthesia     had seizure in 1973  while waking up, after childbirth - vaginal  birth   . PONV (postoperative nausea and vomiting)    Past Surgical History  Procedure Date  . Tonsillectomy   . Cholecystectomy   . Breast surgery     bio  2012   neg  . Orthorscopy     rt knee  . Total knee arthroplasty 12/26/2011    Procedure: TOTAL KNEE ARTHROPLASTY;  Surgeon: Raymon Mutton, MD;  Location: [facility] OR;  Service: Orthopedics;  Laterality: Right;  right total knee arthroplasty  . Abdominal hysterectomy     partial   . Tubal ligation      MEDICATIONS:   Prescriptions prior to admission  Medication Sig Dispense Refill  . acetaminophen (TYLENOL) 500 MG tablet Take 1,000 mg by mouth every 6 (six) hours as needed.      . citalopram (CELEXA) 20 MG tablet Take 20 mg by mouth daily.      Marland Kitchen lisinopril-hydrochlorothiazide (PRINZIDE,ZESTORETIC) 20-25 MG per tablet Take 1  tablet by mouth daily before breakfast.       . Cyanocobalamin (VITAMIN B-12 PO) Take 1 tablet by mouth daily.      . Garlic 1000 MG CAPS Take 1 capsule by mouth daily.      Marland Kitchen ibuprofen (ADVIL,MOTRIN) 200 MG tablet Take 600-800 mg by mouth every 6 (six) hours as needed. For pain      . KRILL OIL PO Take 1 capsule by mouth daily.      Marland Kitchen LORazepam (ATIVAN) 1 MG tablet Take 1-3 mg by mouth at bedtime. Depends on pain      . MAGNESIUM PO Take 1 tablet by mouth daily.      . Multiple Vitamin (MULTIVITAMIN WITH MINERALS) TABS Take 1 tablet by mouth daily.      . traMADol (ULTRAM) 50 MG tablet Take 50-100 mg by mouth every 6 (six) hours as needed. For pain        ALLERGIES:   Allergies  Allergen Reactions  . Oxycodone     Patient ended up in icu    REVIEW OF SYSTEMS:  Pertinent items are noted in HPI.   FAMILY HISTORY:  History reviewed. No pertinent family history.  SOCIAL HISTORY:   History  Substance Use Topics  . Smoking status: Current Every Day Smoker --  1.0 packs/day for 32 years    Types: Cigarettes  . Smokeless tobacco: Not on file  . Alcohol Use: No     Comment: not in x 5 yrs     EXAMINATION:  Vital signs in last 24 hours: Temp:  [98.3 F (36.8 C)] 98.3 F (36.8 C) (11/04 0711) Pulse Rate:  [69] 69  (11/04 0711) Resp:  [16] 16  (11/04 0711) BP: (128)/(73) 128/73 mmHg (11/04 0711) SpO2:  [93 %] 93 % (11/04 0711)  General appearance: alert, cooperative and no distress Lungs: clear to auscultation bilaterally Heart: regular rate and rhythm, S1, S2 normal, no murmur, click, rub or gallop Abdomen: soft, non-tender; bowel sounds normal; no masses,  no organomegaly Extremities: extremities normal, atraumatic, no cyanosis or edema and Homans sign is negative, no sign of DVT Pulses: 2+ and symmetric Skin: Skin color, texture, turgor normal. No rashes or lesions Neurologic: Alert and oriented X 3, normal strength and tone. Normal symmetric reflexes. Normal coordination  and gait  Musculoskeletal:  ROM 0-110, Ligaments intact,  Imaging Review Plain radiographs demonstrate severe degenerative joint disease of the left knee. The overall alignment is mild varus. The bone quality appears to be good for age and reported activity level.  Assessment/Plan: End stage arthritis, left knee   The patient history, physical examination and imaging studies are consistent with advanced degenerative joint disease of the left knee. The patient has failed conservative treatment.  The clearance notes were reviewed.  After discussion with the patient it was felt that Total Knee Replacement was indicated. The procedure,  risks, and benefits of total knee arthroplasty were presented and reviewed. The risks including but not limited to aseptic loosening, infection, blood clots, vascular injury, stiffness, patella tracking problems complications among others were discussed. The patient acknowledged the explanation, agreed to proceed with the plan.  Edlyn Rosenburg 07/02/2012, 7:13 AM

## 2012-07-02 NOTE — Anesthesia Postprocedure Evaluation (Signed)
  Anesthesia Post-op Note  Patient: Kimberly Love  Procedure(s) Performed: Procedure(s) (LRB) with comments: TOTAL KNEE ARTHROPLASTY (Left) - left total knee replacement  Patient Location: PACU  Anesthesia Type:GA combined with regional for post-op pain  Level of Consciousness: awake and alert   Airway and Oxygen Therapy: Patient Spontanous Breathing  Post-op Pain: mild  Post-op Assessment: Post-op Vital signs reviewed, Patient's Cardiovascular Status Stable, Respiratory Function Stable, Patent Airway, No signs of Nausea or vomiting and Pain level controlled  Post-op Vital Signs: stable  Complications: No apparent anesthesia complications

## 2012-07-02 NOTE — Preoperative (Signed)
Beta Blockers   Reason not to administer Beta Blockers:Not Applicable 

## 2012-07-02 NOTE — Progress Notes (Signed)
Orthopedic Tech Progress Note Patient Details:  Kimberly Love April 25, 1951 [id number] CPM applied to Left LE with appropriate settings; OHF applied to bed CPM Left Knee CPM Left Knee: On Left Knee Flexion (Degrees): 90  Left Knee Extension (Degrees): 0    Asia R Thompson 07/02/2012, 11:54 AM

## 2012-07-03 ENCOUNTER — Encounter (HOSPITAL_COMMUNITY): Payer: Self-pay | Admitting: General Practice

## 2012-07-03 LAB — BASIC METABOLIC PANEL
Calcium: 8.9 mg/dL (ref 8.4–10.5)
GFR calc Af Amer: >90 mL/min (ref >90)
GFR calc non Af Amer: >90 mL/min (ref >90)
Glucose, Bld: 140 mg/dL — ABNORMAL HIGH (ref 70–99)
Sodium: 133 mEq/L — ABNORMAL LOW (ref 135–145)

## 2012-07-03 LAB — CBC
Hemoglobin: 11 g/dL — ABNORMAL LOW (ref 12.0–15.0)
MCH: 28.2 pg (ref 26.0–34.0)
MCHC: 31.9 g/dL (ref 30.0–36.0)
RDW: 15.6 % — ABNORMAL HIGH (ref 11.5–15.5)

## 2012-07-03 NOTE — Progress Notes (Signed)
Physical Therapy Treatment Patient Details Name: Kimberly Love MRN: [id number] DOB: 1951-04-11 Today's Date: 07/03/2012 Time: 6578-4696 PT Time Calculation (min): 33 min  PT Assessment / Plan / Recommendation Comments on Treatment Session  Pt admitted s/p left TKA and continues to progress with PT. Pt very motivated and able to increase ambulation distance/independence this am.    Follow Up Recommendations  Home health PT;Supervision/Assistance - 24 hour     Does the patient have the potential to tolerate intense rehabilitation     Barriers to Discharge        Equipment Recommendations  None recommended by PT    Recommendations for Other Services    Frequency 7X/week   Plan Discharge plan remains appropriate;Frequency remains appropriate    Precautions / Restrictions Precautions Precautions: Fall Restrictions Weight Bearing Restrictions: Yes LLE Weight Bearing: Weight bearing as tolerated   Pertinent Vitals/Pain 5/10 in left knee. Pt repositioned.    Mobility  Bed Mobility Bed Mobility: Supine to Sit Supine to Sit: 4: Min assist;HOB flat Details for Bed Mobility Assistance: Assist for left LE due to pain with cues for sequence. Transfers Transfers: Sit to Stand;Stand to Sit Sit to Stand: 4: Min assist;With upper extremity assist;From bed Stand to Sit: 4: Min assist;With upper extremity assist;To chair/3-in-1 Details for Transfer Assistance: Assist for balance with cues for safest hand/left LE placement. Ambulation/Gait Ambulation/Gait Assistance: 4: Min assist Ambulation Distance (Feet): 120 Feet Assistive device: Rolling walker Ambulation/Gait Assistance Details: Assist for balance due to pain and left LE weakness. Cues for safe sequence inside RW and tall posture. Gait Pattern: Step-to pattern;Decreased step length - left;Decreased stance time - left;Trunk flexed Stairs: No Wheelchair Mobility Wheelchair Mobility: No    Exercises Total Joint  Exercises Ankle Circles/Pumps: AROM;Left;10 reps;Supine Quad Sets: AROM;Left;10 reps;Supine Heel Slides: AAROM;Left;10 reps;Supine Hip ABduction/ADduction: AAROM;Left;10 reps;Supine Straight Leg Raises: AAROM;Left;10 reps;Supine Goniometric ROM: AA/ROM left knee 0-45 degrees.   PT Diagnosis:    PT Problem List:   PT Treatment Interventions:     PT Goals Acute Rehab PT Goals PT Goal Formulation: With patient Time For Goal Achievement: 07/09/12 Potential to Achieve Goals: Good PT Goal: Supine/Side to Sit - Progress: Progressing toward goal PT Goal: Sit to Stand - Progress: Progressing toward goal PT Goal: Stand to Sit - Progress: Progressing toward goal PT Goal: Ambulate - Progress: Progressing toward goal PT Goal: Perform Home Exercise Program - Progress: Progressing toward goal  Visit Information  Last PT Received On: 07/03/12 Assistance Needed: +1    Subjective Data  Subjective: "I am doing great today." Patient Stated Goal: To go home   Cognition  Overall Cognitive Status: Appears within functional limits for tasks assessed/performed Arousal/Alertness: Awake/alert Orientation Level: Appears intact for tasks assessed Behavior During Session: [facility] for tasks performed    Balance  Balance Balance Assessed: No  End of Session PT - End of Session Equipment Utilized During Treatment: Gait belt Activity Tolerance: Patient tolerated treatment well Patient left: in chair;with call bell/phone within reach Nurse Communication: Mobility status   GP     Cephus Shelling 07/03/2012, 9:08 AM  07/03/2012 Cephus Shelling, PT, DPT [phone]

## 2012-07-03 NOTE — Evaluation (Signed)
Occupational Therapy Evaluation Patient Details Name: Kimberly Love MRN: [id number] DOB: 1950/12/20 [name]: 07/03/2012 Time: 4098-1191 OT Time Calculation (min): 18 min  OT Assessment / Plan / Recommendation Clinical Impression  Pt. 61 yo female s/p left TKA. Pt required much motivation to participate in session this afternoon. Would benefit from OT acutely to address independence with ADL's.     OT Assessment  Patient needs continued OT Services    Follow Up Recommendations  Home health OT    Barriers to Discharge      Equipment Recommendations  None recommended by OT    Recommendations for Other Services    Frequency  Min 2X/week    Precautions / Restrictions Precautions Precautions: Fall Restrictions Weight Bearing Restrictions: Yes LLE Weight Bearing: Weight bearing as tolerated   Pertinent Vitals/Pain 6/10 pain     ADL  Grooming: Performed;Wash/dry hands;Supervision/safety Where Assessed - Grooming: Unsupported standing Toilet Transfer: Research scientist (life sciences) Method: Sit to Barista: Raised toilet seat with arms (or 3-in-1 over toilet) Toileting - Clothing Manipulation and Hygiene: Performed;Independent Where Assessed - Toileting Clothing Manipulation and Hygiene: Sit to stand from 3-in-1 or toilet Equipment Used: Gait belt;Rolling walker Transfers/Ambulation Related to ADLs: Supervision during transfers and ambulation for safety  ADL Comments: Pt required a lot of motivation to participate in session this afternoon, stating she had just recieved a muscle relaxer and didn't really feel like getting up. Educated pt on importance of OT and Dr.'s orders to evaluate. Pt reluctantly decided to get up and try to use the bathroom. Ambulated to bathroom and transfered to toilet with supervision. Able to wash hands at sink level with supervision. When getting back into bed required min (A) to bring bil LE's onto bed.       OT Diagnosis: Generalized weakness;Acute pain  OT Problem List: Decreased activity tolerance;Decreased safety awareness;Decreased knowledge of use of DME or AE;Pain OT Treatment Interventions: Self-care/ADL training;Therapeutic exercise;DME and/or AE instruction;Therapeutic activities;Patient/family education   OT Goals Acute Rehab OT Goals OT Goal Formulation: With patient Time For Goal Achievement: 07/17/12 Potential to Achieve Goals: Good ADL Goals Pt Will Perform Lower Body Bathing: with supervision;Sit to stand from chair;with adaptive equipment Pt Will Perform Lower Body Dressing: with supervision;Sit to stand from chair;with adaptive equipment Pt Will Perform Tub/Shower Transfer: Tub transfer;with supervision;Ambulation;Transfer tub bench  Visit Information  Last OT Received On: 07/03/12 Assistance Needed: +1    Subjective Data  Subjective: You came at bad timing, I just got a muscle relaxer and don't really want to get back up  Patient Stated Goal: To get better and go back home    Prior Functioning     Home Living Lives With: Spouse Available Help at Discharge: Family Type of Home: House Home Access: Stairs to enter Secretary/administrator of Steps: 2 Entrance Stairs-Rails: None Home Layout: One level Bathroom Shower/Tub: Engineer, manufacturing systems: Standard Bathroom Accessibility: Yes How Accessible: Accessible via walker Home Adaptive Equipment: Bedside commode/3-in-1;Walker - rolling;Straight cane;Tub transfer bench Prior Function Level of Independence: Independent Able to Take Stairs?: Yes Driving: Yes Vocation: Full time employment Communication Communication: No difficulties Dominant Hand: Right         Vision/Perception     Cognition  Overall Cognitive Status: Appears within functional limits for tasks assessed/performed Arousal/Alertness: Awake/alert Orientation Level: Appears intact for tasks assessed Behavior During Session: [facility] for  tasks performed    Extremity/Trunk Assessment Right Upper Extremity Assessment RUE ROM/Strength/Tone: Within functional levels Left Upper Extremity Assessment  LUE ROM/Strength/Tone: Within functional levels     Mobility Bed Mobility Bed Mobility: Supine to Sit;Sitting - Scoot to [place] of Bed;Sit to Supine;Scooting to [facility] Supine to Sit: 5: Supervision;With rails Sitting - Scoot to Edge of Bed: 5: Supervision Sit to Supine: 4: Min assist;HOB flat Scooting to [facility]: 5: Supervision;With trapeze;With rail Details for Bed Mobility Assistance: (A) with bil LE's onto bed when performing sit to supine. Required no (A) with supine to sit.  Transfers Transfers: Sit to Stand;Stand to Sit Sit to Stand: 5: Supervision;With upper extremity assist;From bed;From chair/3-in-1 Stand to Sit: 5: Supervision;With upper extremity assist;To bed;To chair/3-in-1 Details for Transfer Assistance: cues for safe hand placement                Balance Balance Balance Assessed: No   End of Session OT - End of Session Equipment Utilized During Treatment: Gait belt Activity Tolerance: Patient tolerated treatment well Patient left: in bed;with call bell/phone within reach;with family/visitor present Nurse Communication: Mobility status  GO     Kimberly Love 07/03/2012, 2:49 PM

## 2012-07-03 NOTE — Progress Notes (Signed)
Utilization review completed. Denasia Venn, RN, BSN. 

## 2012-07-03 NOTE — Progress Notes (Signed)
SPORTS MEDICINE AND JOINT REPLACEMENT  Georgena Spurling, MD   Altamese Cabal, PA-C [address] [place], [place], [place]  [address]                             [phone]   PROGRESS NOTE  Subjective:  negative for Chest Pain  negative for Shortness of Breath  negative for Nausea/Vomiting   negative for Calf Pain  negative for Bowel Movement   Tolerating Diet: yes         Patient reports pain as 6 on 0-10 scale.    Objective: Vital signs in last 24 hours:   Patient Vitals for the past 24 hrs:  BP Temp Temp src Pulse Resp SpO2  07/03/12 0628 134/53 mmHg 98.4 F (36.9 C) Oral 72  16  98 %  07/02/12 2222 110/53 mmHg 98.3 F (36.8 C) Oral 66  16  93 %  07/02/12 1600 - - - - 13  94 %  07/02/12 1243 109/39 mmHg 97.4 F (36.3 C) - 91  13  98 %  07/02/12 1215 - - - 86  12  93 %  07/02/12 1200 118/53 mmHg 97.7 F (36.5 C) - 89  10  92 %  07/02/12 1145 137/50 mmHg - - 91  13  94 %  07/02/12 1130 142/59 mmHg - - 91  17  99 %  07/02/12 1117 - 97.8 F (36.6 C) - - - -    @flow {1959:LAST@   Intake/Output from previous day:   11/04 0701 - 11/05 0700 In: 3242.5 [P.O.:600; I.V.:2437.5] Out: 3100 [Urine:2625; Drains:400]   Intake/Output this shift:       Intake/Output      11/04 0701 - 11/05 0700 11/05 0701 - 11/06 0700   P.O. 600    I.V. 2437.5    IV Piggyback 205    Total Intake 3242.5    Urine 2625    Drains 400    Blood 75    Total Output 3100    Net +142.5            LABORATORY DATA:  Basename 07/03/12 0625 06/28/12 1630  WBC 18.8* 11.9*  HGB 11.0* 13.7  HCT 34.5* 40.1  PLT 257 279    Basename 06/28/12 1630  NA 138  K 3.7  CL 101  CO2 27  BUN 15  CREATININE 0.48*  GLUCOSE 97  CALCIUM 9.6   Lab Results  Component Value Date   INR 1.05 06/28/2012   INR 0.92 12/19/2011    Examination:  General appearance: alert, cooperative and no distress Extremities: Homans sign is negative, no sign of DVT  Wound Exam: clean, dry, intact   Drainage:   Scant/small amount Serosanguinous exudate  Motor Exam: EHL and FHL Intact  Sensory Exam: Deep Peroneal normal  Vascular Exam:    Assessment:    1 Day Post-Op  Procedure(s) (LRB): TOTAL KNEE ARTHROPLASTY (Left)  ADDITIONAL DIAGNOSIS:  Active Problems:  * No active hospital problems. *   Acute Blood Loss Anemia   Plan: Physical Therapy as ordered Weight Bearing as Tolerated (WBAT)  DVT Prophylaxis:  Lovenox  DISCHARGE PLAN: Home  DISCHARGE NEEDS: HHPT, CPM, Walker and 3-in-1 comode seat         Lu Paradise 07/03/2012, 7:31 AM

## 2012-07-03 NOTE — Progress Notes (Signed)
CARE MANAGEMENT NOTE 07/03/2012  Patient:  Kimberly Love, Kimberly Love   Account Number:  [id number]  Date Initiated:  07/03/2012  Documentation initiated by:  Vance Peper  Subjective/Objective Assessment:   60 yr old female s/p left total knee arthroplasty     Action/Plan:   CM spoke with patient concerning HH and DME needs. Preoperatively setup with [facility], no changes. TNT to deliver DME to patient's home.   Anticipated DC Date:  07/04/2012   Anticipated DC Plan:  HOME W HOME HEALTH SERVICES      DC Planning Services  CM consult      Choice offered to / List presented to:  C-1 Patient        HH arranged  HH-2 PT      [facility] agency  [facility]   Status of service:  Completed, signed off Medicare Important Message given?   (If response is "NO", the following Medicare IM given date fields will be blank) Date Medicare IM given:   Date Additional Medicare IM given:    Discharge Disposition:  HOME W HOME HEALTH SERVICES  Per UR Regulation:    If discussed at Long Length of Stay Meetings, dates discussed:    Comments:

## 2012-07-03 NOTE — Progress Notes (Signed)
PT Treatment Note:   07/03/12 1130  PT Visit Information  Last PT Received On 07/03/12  Assistance Needed +1  PT Time Calculation  PT Start Time 1055  PT Stop Time 1125  PT Time Calculation (min) 30 min  Subjective Data  Subjective "Wow! This is much better."  Patient Stated Goal To go home  Precautions  Precautions Fall  Restrictions  Weight Bearing Restrictions Yes  LLE Weight Bearing WBAT  Cognition  Overall Cognitive Status Appears within functional limits for tasks assessed/performed  Arousal/Alertness Awake/alert  Orientation Level Appears intact for tasks assessed  Behavior During Session [facility] for tasks performed  Bed Mobility  Bed Mobility Not assessed  Transfers  Transfers Sit to Stand;Stand to Sit (2 trials.)  Sit to Stand 4: Min guard;With upper extremity assist;From chair/3-in-1  Stand to Sit 4: Min guard;With upper extremity assist;To chair/3-in-1  Details for Transfer Assistance Guarding for balance. Cues for sequence and safest hand placement.  Ambulation/Gait  Ambulation/Gait Assistance 4: Min guard  Ambulation Distance (Feet) 160 Feet  Assistive device Rolling walker  Ambulation/Gait Assistance Details Guarding for balance with cues for tall posture and safety.   Gait Pattern Step-to pattern;Decreased step length - left;Decreased stance time - left;Trunk flexed  Stairs No  Wheelchair Mobility  Wheelchair Mobility No  Balance  Balance Assessed No  PT - End of Session  Equipment Utilized During Treatment Gait belt  Activity Tolerance Patient tolerated treatment well  Patient left in chair;with call bell/phone within reach  Nurse Communication Mobility status  PT - Assessment/Plan  Comments on Treatment Session Pt admitted s/p left TKA and was able to tolerate BID ambulation today. Pt able to increase ambulation distance as well as independence. Motivated.  PT Plan Discharge plan remains appropriate;Frequency remains appropriate  PT Frequency 7X/week    Follow Up Recommendations Home health PT;Supervision/Assistance - 24 hour  Equipment Recommended None recommended by PT  Acute Rehab PT Goals  PT Goal Formulation With patient  Time For Goal Achievement 07/09/12  Potential to Achieve Goals Good  PT Goal: Sit to Stand - Progress Progressing toward goal  PT Goal: Stand to Sit - Progress Progressing toward goal  PT Goal: Ambulate - Progress Progressing toward goal  PT General Charges  $$ ACUTE PT VISIT 1 Procedure  PT Treatments  $Gait Training 8-22 mins  $Therapeutic Activity 8-22 mins    No c/o with treatment.  07/03/2012 Cephus Shelling, PT, DPT [phone]

## 2012-07-03 NOTE — Progress Notes (Signed)
OT Cancellation Note  Patient Details Name: Kimberly Love MRN: [id number] DOB: [birth year]   Cancelled Treatment:     OT attempting evaluation and patient politely declining at this time. OT to reattempt evaluation at later date/time per patient's request.  Lucile Shutters Pager: [phone]  07/03/2012, 10:35 AM

## 2012-07-04 LAB — BASIC METABOLIC PANEL
Calcium: 8.8 mg/dL (ref 8.4–10.5)
GFR calc non Af Amer: >90 mL/min (ref >90)
Sodium: 137 mEq/L (ref 135–145)

## 2012-07-04 LAB — CBC
MCH: 28.4 pg (ref 26.0–34.0)
MCHC: 31.9 g/dL (ref 30.0–36.0)
Platelets: 226 10*3/uL (ref 150–400)
RBC: 3.59 MIL/uL — ABNORMAL LOW (ref 3.87–5.11)

## 2012-07-04 MED ORDER — ENOXAPARIN SODIUM 40 MG/0.4ML ~~LOC~~ SOLN: 40.0000 mg | Freq: Every day | SUBCUTANEOUS | Status: DC

## 2012-07-04 MED ORDER — TRAMADOL HCL 50 MG PO TABS: 50.0000 mg | ORAL_TABLET | Freq: Four times a day (QID) | ORAL | Status: DC | PRN

## 2012-07-04 MED ORDER — METHOCARBAMOL 500 MG PO TABS: 500.0000 mg | ORAL_TABLET | Freq: Four times a day (QID) | ORAL | Status: DC | PRN

## 2012-07-04 NOTE — Progress Notes (Signed)
Occupational Therapy Treatment Patient Details Name: Kimberly Love MRN: [id number] DOB: 1951-01-31 Today's Date: 07/04/2012 Time: 5284-1324 OT Time Calculation (min): 16 min  OT Assessment / Plan / Recommendation Comments on Treatment Session Pt doing well and is very motivated to regain I    Follow Up Recommendations  No OT follow up       Equipment Recommendations  None recommended by OT       Frequency Min 2X/week   Plan Discharge plan remains appropriate    Precautions / Restrictions Precautions Precautions: Fall Restrictions Weight Bearing Restrictions: Yes LLE Weight Bearing: Weight bearing as tolerated       ADL  Lower Body Dressing: Performed;Minimal assistance Where Assessed - Lower Body Dressing: Unsupported sit to stand Toileting - Clothing Manipulation and Hygiene: Performed;Supervision/safety Tub/Shower Transfer: Other (comment) (pt declined at she has a tub bench at home ) ADL Comments: Pt doing well. Husband will asssist with ADL activity as needed. Pt declined to practice tub transfer as she felt comfortable with this from last surgery.  Educated pt on correct technique for dressing.     OT Goals ADL Goals ADL Goal: Lower Body Dressing - Progress: Progressing toward goals  Visit Information  Last OT Received On: 07/04/12 Assistance Needed: +1    Subjective Data  Subjective: i am going home in a little while      Cognition  Overall Cognitive Status: Appears within functional limits for tasks assessed/performed Arousal/Alertness: Awake/alert Orientation Level: Appears intact for tasks assessed Behavior During Session: [facility] for tasks performed    Mobility  Shoulder Instructions Bed Mobility Bed Mobility: Not assessed Transfers Sit to Stand: 6: Modified independent (Device/Increase time) Stand to Sit: 6: Modified independent (Device/Increase time)          Balance Balance Balance Assessed: No   End of Session OT - End of  Session Activity Tolerance: Patient tolerated treatment well Patient left: in chair  GO     Johnnetta Holstine, Metro Kung 07/04/2012, 2:43 PM

## 2012-07-04 NOTE — Discharge Instructions (Signed)
Home Health to be provided by [facility]- [phone]  Diet: As you were doing prior to hospitalization   Activity:  Increase activity slowly as tolerated                  No lifting or driving for 6 weeks  Shower:  May shower without a dressing once there is no drainage from your wound.                 Do NOT wash over the wound.  If drainage remains, cover wound with saran                  Wrap and then shower.  Clean incision with betadine and change dressing                        After saran wrap removed.  Dressing:  You may change your dressing on Thursday                    Then change the dressing daily with sterile 4"x4"s gauze dressing                     And TED hose for knees.  Use paper tape to hold dressing in place                     For hips.  You may clean the incision with alcohol prior to redressing.  Weight Bearing:  Weight bearing as tolerated as taught in physical therapy.  Use a                                walker or Crutches as instructed.  To prevent constipation: you may use a stool softener such as -               Colace ( over the counter) 100 mg by mouth twice a day                Drink plenty of fluids ( prune juice may be helpful) and high fiber foods                Miralax ( over the counter) for constipation as needed.    Precautions:  If you experience chest pain or shortness of breath - call 911 immediately               For transfer to the hospital emergency department!!               If you develop a fever greater that 101 F, purulent drainage from wound,                             increased redness or drainage from wound, or calf pain -- Call the office at                                                 [phone].  Follow- Up Appointment:  Please call for an appointment to be seen on 07/27/12  [place] - [phone]

## 2012-07-04 NOTE — Discharge Summary (Signed)
Georgena Spurling, MD   Altamese Cabal, PA-C [address] [place], [place], [place]  [address]                             [phone]  PATIENT ID: Kimberly Love        MRN:  [id number]          DOB/AGE: [birth year] / 61 y.o.    DISCHARGE SUMMARY  ADMISSION DATE:    07/02/2012 DISCHARGE DATE:   07/04/2012   ADMISSION DIAGNOSIS: osteoarthritis left knee    DISCHARGE DIAGNOSIS:  osteoarthritis left knee    ADDITIONAL DIAGNOSIS: Active Problems:  * No active hospital problems. *   Past Medical History  Diagnosis Date  . Anxiety   . Depression   . Hypertension     dr Timothy Lasso   [facility]  [place]  . Chronic kidney disease   . Seizures     x1- 1973- post childbirth   . Arthritis     OA- L knee   . Complication of anesthesia     had seizure in 1973  while waking up, after childbirth - vaginal  birth   . PONV (postoperative nausea and vomiting)     PROCEDURE: Procedure(s): TOTAL KNEE ARTHROPLASTY on 07/02/2012  CONSULTS:     HISTORY:  See H&P in chart  HOSPITAL COURSE:  Kimberly Love is a 61 y.o. admitted on 07/02/2012 and found to have a diagnosis of osteoarthritis left knee.  After appropriate laboratory studies were obtained  they were taken to the operating room on 07/02/2012 and underwent Procedure(s): TOTAL KNEE ARTHROPLASTY.   They were given perioperative antibiotics:  Anti-infectives     Start     Dose/Rate Route Frequency Ordered Stop   07/02/12 1500   ceFAZolin (ANCEF) IVPB 2 g/50 mL premix        2 g 100 mL/hr over 30 Minutes Intravenous Every 6 hours 07/02/12 1230 07/02/12 2322   07/01/12 1327   ceFAZolin (ANCEF) IVPB 2 g/50 mL premix        2 g 100 mL/hr over 30 Minutes Intravenous 60 min pre-op 07/01/12 1327 07/02/12 0912        .  Tolerated the procedure well.  Placed with a foley intraoperatively.  Given Ofirmev at induction and for 48 hours.    POD #1, allowed out of bed to a chair.  PT for ambulation and exercise program.  Foley D/C'd in  morning.  IV saline locked.  O2 discontionued.  POD #2, continued PT and ambulation.   Hemovac pulled. .  The remainder of the hospital course was dedicated to ambulation and strengthening.   The patient was discharged on 2 Days Post-Op in  Good condition.  Blood products given:none  DIAGNOSTIC STUDIES: Recent vital signs: Patient Vitals for the past 24 hrs:  BP Temp Temp src Pulse Resp SpO2  07/04/12 1344 111/72 mmHg 98.4 F (36.9 C) Oral 79  18  95 %  07/04/12 0900 151/70 mmHg 98.3 F (36.8 C) Oral 87  18  90 %  07/04/12 0538 136/57 mmHg 98.1 F (36.7 C) Oral 79  18  92 %  Jul 24, 2012 2031 116/37 mmHg 98.2 F (36.8 C) - 64  18  91 %  07/24/12 1600 - - - - 18  -       Recent laboratory studies:  Basename 07/04/12 0550 07/24/12 0625 06/28/12 1630  WBC 13.1* 18.8* 11.9*  HGB 10.2*  11.0* 13.7  HCT 32.0* 34.5* 40.1  PLT 226 257 279    Basename 07/04/12 0550 07/03/12 0625 06/28/12 1630  NA 137 133* 138  K 4.0 5.8* 3.7  CL 101 99 101  CO2 28 25 27   BUN 12 12 15   CREATININE 0.50 0.43* 0.48*  GLUCOSE 91 140* 97  CALCIUM 8.8 8.9 9.6   Lab Results  Component Value Date   INR 1.05 06/28/2012   INR 0.92 12/19/2011     Recent Radiographic Studies :  No results found.  DISCHARGE INSTRUCTIONS: Discharge Orders    Future Orders Please Complete By Expires   Diet - low sodium heart healthy      Call MD / Call 911      Comments:   If you experience chest pain or shortness of breath, CALL 911 and be transported to the hospital emergency room.  If you develope a fever above 101 F, pus (white drainage) or increased drainage or redness at the wound, or calf pain, call your surgeon's office.   Constipation Prevention      Comments:   Drink plenty of fluids.  Prune juice may be helpful.  You may use a stool softener, such as Colace (over the counter) 100 mg twice a day.  Use MiraLax (over the counter) for constipation as needed.   Increase activity slowly as tolerated      Driving  restrictions      Comments:   No driving for 6 weeks   Lifting restrictions      Comments:   No lifting for 6 weeks   CPM      Comments:   Continuous passive motion machine (CPM):      Use the CPM from 0 to 90 for 6-8 hours per day.      You may increase by 10 per day.  You may break it up into 2 or 3 sessions per day.      Use CPM for 2 weeks or until you are told to stop.   TED hose      Comments:   Use stockings (TED hose) for 3 weeks on both leg(s).  You may remove them at night for sleeping.   Change dressing      Comments:   Change dressing on thursday, then change the dressing daily with sterile 4 x 4 inch gauze dressing and apply TED hose.  You may clean the incision with alcohol prior to redressing.   Do not put a pillow under the knee. Place it under the heel.         DISCHARGE MEDICATIONS:     Medication List     As of 07/04/2012  2:15 PM    TAKE these medications         acetaminophen 500 MG tablet   Commonly known as: TYLENOL   Take 1,000 mg by mouth every 6 (six) hours as needed.      citalopram 20 MG tablet   Commonly known as: CELEXA   Take 20 mg by mouth daily.      enoxaparin 40 MG/0.4ML injection   Commonly known as: LOVENOX   Inject 0.4 mLs (40 mg total) into the skin daily.      Garlic 1000 MG Caps   Take 1 capsule by mouth daily.      ibuprofen 200 MG tablet   Commonly known as: ADVIL,MOTRIN   Take 600-800 mg by mouth every 6 (six) hours as needed. For pain  KRILL OIL PO   Take 1 capsule by mouth daily.      lisinopril-hydrochlorothiazide 20-25 MG per tablet   Commonly known as: PRINZIDE,ZESTORETIC   Take 1 tablet by mouth daily before breakfast.      LORazepam 1 MG tablet   Commonly known as: ATIVAN   Take 1-3 mg by mouth at bedtime. Depends on pain      MAGNESIUM PO   Take 1 tablet by mouth daily.      methocarbamol 500 MG tablet   Commonly known as: ROBAXIN   Take 1 tablet (500 mg total) by mouth every 6 (six) hours as  needed.      multivitamin with minerals Tabs   Take 1 tablet by mouth daily.      traMADol 50 MG tablet   Commonly known as: ULTRAM   Take 1-2 tablets (50-100 mg total) by mouth every 6 (six) hours as needed. For pain      VITAMIN B-12 PO   Take 1 tablet by mouth daily.        FOLLOW UP VISIT:       Follow-up Information    Follow up with Raymon Mutton, MD. Call on 07/17/2012.   Contact information:   [address] [place] [address] [phone]          DISPOSITION: home  CONDITION:  {Good  Andora Krull 07/04/2012, 2:15 PM

## 2012-07-04 NOTE — Evaluation (Signed)
I agree with the following treatment note after reviewing documentation.   Johnston, Burnetta Kohls Brynn   OTR/L Pager: [phone] Office: [phone] .   

## 2012-07-04 NOTE — Progress Notes (Signed)
SPORTS MEDICINE AND JOINT REPLACEMENT  Kimberly Spurling, MD   Altamese Cabal, PA-C [address] [place], [place], [place]  [address]                             [phone]   PROGRESS NOTE  Subjective:  negative for Chest Pain  negative for Shortness of Breath  negative for Nausea/Vomiting   negative for Calf Pain  negative for Bowel Movement   Tolerating Diet: yes         Patient reports pain as 4 on 0-10 scale.    Objective: Vital signs in last 24 hours:   Patient Vitals for the past 24 hrs:  BP Temp Temp src Pulse Resp SpO2  07/04/12 1344 111/72 mmHg 98.4 F (36.9 C) Oral 79  18  95 %  07/04/12 0900 151/70 mmHg 98.3 F (36.8 C) Oral 87  18  90 %  07/04/12 0538 136/57 mmHg 98.1 F (36.7 C) Oral 79  18  92 %  07/03/12 2031 116/37 mmHg 98.2 F (36.8 C) - 64  18  91 %  07/03/12 1600 - - - - 18  -    @flow {1959:LAST@   Intake/Output from previous day:   11/05 0701 - 11/06 0700 In: 2480 [P.O.:720; I.V.:1760] Out: 150 [Urine:150]   Intake/Output this shift:       Intake/Output      11/05 0701 - 11/06 0700 11/06 0701 - 11/07 0700   P.O. 720    I.V. 1760    IV Piggyback     Total Intake 2480    Urine 150    Drains     Blood     Total Output 150    Net +2330            LABORATORY DATA:  Basename 07/04/12 0550 07/03/12 0625 06/28/12 1630  WBC 13.1* 18.8* 11.9*  HGB 10.2* 11.0* 13.7  HCT 32.0* 34.5* 40.1  PLT 226 257 279    Basename 07/04/12 0550 07/03/12 0625 06/28/12 1630  NA 137 133* 138  K 4.0 5.8* 3.7  CL 101 99 101  CO2 28 25 27   BUN 12 12 15   CREATININE 0.50 0.43* 0.48*  GLUCOSE 91 140* 97  CALCIUM 8.8 8.9 9.6   Lab Results  Component Value Date   INR 1.05 06/28/2012   INR 0.92 12/19/2011    Examination:  General appearance: alert, cooperative and no distress Extremities: Homans sign is negative, no sign of DVT  Wound Exam: clean, dry, intact   Drainage:  None: wound tissue dry  Motor Exam: EHL and FHL Intact  Sensory Exam: Deep  Peroneal normal  Vascular Exam:    Assessment:    2 Days Post-Op  Procedure(s) (LRB): TOTAL KNEE ARTHROPLASTY (Left)  ADDITIONAL DIAGNOSIS:  Active Problems:  * No active hospital problems. *   Acute Blood Loss Anemia   Plan: Physical Therapy as ordered Weight Bearing as Tolerated (WBAT)  DVT Prophylaxis:  Lovenox  DISCHARGE PLAN: Home  DISCHARGE NEEDS: HHPT, CPM, Walker and 3-in-1 comode seat         Rogue Pautler 07/04/2012, 2:09 PM

## 2012-07-04 NOTE — Progress Notes (Signed)
Physical Therapy Treatment Patient Details Name: Kimberly Love MRN: [id number] DOB: 11-01-1950 Today's Date: 07/04/2012 Time: 0865-7846 PT Time Calculation (min): 20 min  PT Assessment / Plan / Recommendation Comments on Treatment Session  Pt admitted s/p left TKA and continues to make progress with PT. Pt tolerate BID ambulation with increase independence today as well as stair negotiation this am session. Pt ready for safe d/c home once medically cleared by MD.    Follow Up Recommendations  Home health PT;Supervision/Assistance - 24 hour     Does the patient have the potential to tolerate intense rehabilitation     Barriers to Discharge        Equipment Recommendations  None recommended by PT    Recommendations for Other Services    Frequency 7X/week   Plan Discharge plan remains appropriate;Frequency remains appropriate    Precautions / Restrictions Precautions Precautions: Fall Restrictions Weight Bearing Restrictions: Yes LLE Weight Bearing: Weight bearing as tolerated   Pertinent Vitals/Pain 3/10 in left knee. Pt repositioned.    Mobility  Bed Mobility Bed Mobility: Not assessed Transfers Transfers: Sit to Stand;Stand to Sit Sit to Stand: 6: Modified independent (Device/Increase time) Stand to Sit: 6: Modified independent (Device/Increase time) Ambulation/Gait Ambulation/Gait Assistance: 5: Supervision Ambulation Distance (Feet): 120 Feet Assistive device: Rolling walker Ambulation/Gait Assistance Details: Cues for tall posture and safe sequence of step-through with initial contact on left heel. Gait Pattern: Step-through pattern;Decreased step length - left;Decreased stance time - left Stairs: No Wheelchair Mobility Wheelchair Mobility: No    Exercises     PT Diagnosis:    PT Problem List:   PT Treatment Interventions:     PT Goals Acute Rehab PT Goals PT Goal Formulation: With patient Time For Goal Achievement: 07/09/12 Potential to Achieve  Goals: Good PT Goal: Sit to Stand - Progress: Met PT Goal: Stand to Sit - Progress: Met PT Goal: Ambulate - Progress: Progressing toward goal  Visit Information  Last PT Received On: 07/04/12 Assistance Needed: +1    Subjective Data  Subjective: "I hope to go home this evening." Patient Stated Goal: To go home   Cognition  Overall Cognitive Status: Appears within functional limits for tasks assessed/performed Arousal/Alertness: Awake/alert Orientation Level: Appears intact for tasks assessed Behavior During Session: [facility] for tasks performed    Balance  Balance Balance Assessed: No  End of Session PT - End of Session Equipment Utilized During Treatment: Gait belt Activity Tolerance: Patient tolerated treatment well Patient left: in chair;with call bell/phone within reach Nurse Communication: Mobility status   GP     Cephus Shelling 07/04/2012, 1:35 PM  07/04/2012 Cephus Shelling, PT, DPT [phone]

## 2012-07-04 NOTE — Progress Notes (Signed)
Physical Therapy Treatment Patient Details Name: Kimberly Love MRN: [id number] DOB: 1951/06/09 Today's Date: 07/04/2012 Time: 2952-8413 PT Time Calculation (min): 26 min  PT Assessment / Plan / Recommendation Comments on Treatment Session  Pt admitted s/p left TKA and continues to progress with therapy. Pt able to tolerate ambulation with increased independence this am as well as stair negotation. Ready for safe d/c home once medically cleared by MD.    Follow Up Recommendations  Home health PT;Supervision/Assistance - 24 hour     Does the patient have the potential to tolerate intense rehabilitation     Barriers to Discharge        Equipment Recommendations  None recommended by PT    Recommendations for Other Services    Frequency 7X/week   Plan Discharge plan remains appropriate;Frequency remains appropriate    Precautions / Restrictions Precautions Precautions: Fall Restrictions Weight Bearing Restrictions: Yes LLE Weight Bearing: Weight bearing as tolerated   Pertinent Vitals/Pain 3/10 in left knee. Pt repositioned.    Mobility  Bed Mobility Bed Mobility: Sit to Supine Sit to Supine: 4: Min assist;HOB flat Details for Bed Mobility Assistance: Assist for left LE due to pain with cues for sequence. Transfers Transfers: Sit to Stand;Stand to Sit Sit to Stand: 6: Modified independent (Device/Increase time);With upper extremity assist;From chair/3-in-1 Stand to Sit: 6: Modified independent (Device/Increase time);With upper extremity assist;To bed Ambulation/Gait Ambulation/Gait Assistance: 5: Supervision Ambulation Distance (Feet): 100 Feet Assistive device: Rolling walker Ambulation/Gait Assistance Details: Verbal cues for tall posture and safe sequence with attempts at step-through sequence. Limited by pain. Gait Pattern: Step-to pattern;Step-through pattern;Decreased step length - left;Decreased stance time - left;Trunk flexed Stairs: Yes Stairs Assistance: 4:  Min assist Stairs Assistance Details (indicate cue type and reason): Assist for balance and to off weight left LE due to pain and weakness. Cues for sequence of "up with good, down with bad." Attempts to educate pt on backward sequence with RW. Pt recalls this from last knee surgery stating, "I almost killed myself." Pt declines backward trial. Stair Management Technique: No rails;Step to pattern;Forwards;Other (comment) (Unilateral HHA.) Number of Stairs: 2  Wheelchair Mobility Wheelchair Mobility: No    Exercises Total Joint Exercises Ankle Circles/Pumps: AROM;Left;10 reps;Supine Quad Sets: AROM;Left;10 reps;Supine Heel Slides: AAROM;Left;10 reps;Supine Hip ABduction/ADduction: AAROM;Left;10 reps;Supine Straight Leg Raises: AAROM;Left;10 reps;Supine Goniometric ROM: AA/ROM left knee 0-65 degrees.   PT Diagnosis:    PT Problem List:   PT Treatment Interventions:     PT Goals Acute Rehab PT Goals PT Goal Formulation: With patient Time For Goal Achievement: 07/09/12 Potential to Achieve Goals: Good PT Goal: Sit to Supine/Side - Progress: Progressing toward goal PT Goal: Sit to Stand - Progress: Met PT Goal: Stand to Sit - Progress: Met PT Goal: Ambulate - Progress: Progressing toward goal PT Goal: Up/Down Stairs - Progress: Progressing toward goal PT Goal: Perform Home Exercise Program - Progress: Progressing toward goal  Visit Information  Last PT Received On: 07/04/12 Assistance Needed: +1    Subjective Data  Subjective: "I am ready to do this and hope to go home later." Patient Stated Goal: To go home   Cognition  Overall Cognitive Status: Appears within functional limits for tasks assessed/performed Arousal/Alertness: Awake/alert Orientation Level: Appears intact for tasks assessed Behavior During Session: [facility] for tasks performed    Balance  Balance Balance Assessed: No  End of Session PT - End of Session Equipment Utilized During Treatment: Gait belt Activity  Tolerance: Patient tolerated treatment well Patient left: with  call bell/phone within reach;in bed Nurse Communication: Mobility status   GP     Cephus Shelling 07/04/2012, 8:50 AM  07/04/2012 Cephus Shelling, PT, DPT [phone]

## 2012-07-06 ENCOUNTER — Other Ambulatory Visit (HOSPITAL_COMMUNITY): Payer: Managed Care, Other (non HMO)

## 2012-07-07 NOTE — Op Note (Signed)
TOTAL KNEE REPLACEMENT OPERATIVE NOTE:  07/02/2012  5:15 PM  PATIENT:  Kimberly Love  61 y.o. female  PRE-OPERATIVE DIAGNOSIS:  osteoarthritis left knee  POST-OPERATIVE DIAGNOSIS:  osteoarthritis left knee  PROCEDURE:  Procedure(s): TOTAL KNEE ARTHROPLASTY  SURGEON:  Surgeon(s): Raymon Mutton, MD  PHYSICIAN ASSISTANT: Altamese Cabal, [facility]  ANESTHESIA:   general  DRAINS: Hemovac and On-Q Marcaine Pain Pump  SPECIMEN: None  COUNTS:  Correct  TOURNIQUET:   Total Tourniquet Time Documented: Thigh (Left) - 60 minutes  DICTATION:  Indication for procedure:    The patient is a 61 y.o. female who has failed conservative treatment for osteoarthritis left knee.  Informed consent was obtained prior to anesthesia. The risks versus benefits of the operation were explain and in a way the patient can, and did, understand.   Description of procedure:     The patient was taken to the operating room and placed under anesthesia.  The patient was positioned in the usual fashion taking care that all body parts were adequately padded and/or protected.  I foley catheter was placed.  A tourniquet was applied and the leg prepped and draped in the usual sterile fashion.  The extremity was exsanguinated with the esmarch and tourniquet inflated to 350 mmHg.  Pre-operative range of motion was normal.  The knee was in 5 degree of mild varus.  A midline incision approximately 6-7 inches long was made with a #10 blade.  A new blade was used to make a parapatellar arthrotomy going 2-3 cm into the quadriceps tendon, over the patella, and alongside the medial aspect of the patellar tendon.  A synovectomy was then performed with the #10 blade and forceps. I then elevated the deep MCL off the medial tibial metaphysis subperiosteally around to the semimembranosus attachment.    I everted the patella and used calipers to measure patellar thickness.  I used the reamer to ream down to appropriate thickness  to recreate the native thickness.  I then removed excess bone with the rongeur and sagittal saw.  I used the appropriately sized template and drilled the three lug holes.  I then put the trial in place and measured the thickness with the calipers to ensure recreation of the native thickness.  The trial was then removed and the patella subluxed and the knee brought into flexion.  A homan retractor was place to retract and protect the patella and lateral structures.  A Z-retractor was place medially to protect the medial structures.  The extra-medullary alignment system was used to make cut the tibial articular surface perpendicular to the anamotic axis of the tibia and in 3 degrees of posterior slope.  The cut surface and alignment jig was removed.  I then used the intramedullary alignment guide to make a 6 valgus cut on the distal femur.  I then marked out the epicondylar axis on the distal femur.  The posterior condylar axis measured 5 degrees.  I then used the anterior referencing sizer and measured the femur to be a size E.  The 4-In-1 cutting block was screwed into place in external rotation matching the posterior condylar angle, making our cuts perpendicular to the epicondylar axis.  Anterior, posterior and chamfer cuts were made with the sagittal saw.  The cutting block and cut pieces were removed.  A lamina spreader was placed in 90 degrees of flexion.  The ACL, PCL, menisci, and posterior condylar osteophytes were removed.  A 12 mm spacer blocked was found to offer good flexion  and extension gap balance after minimal in degree releasing.   The scoop retractor was then placed and the femoral finishing block was pinned in place.  The small sagittal saw was used as well as the lug drill to finish the femur.  The block and cut surfaces were removed and the medullary canal hole filled with autograft bone from the cut pieces.  The tibia was delivered forward in deep flexion and external rotation.  A size 4  tray was selected and pinned into place centered on the medial 1/3 of the tibial tubercle.  The reamer and keel was used to prepare the tibia through the tray.    I then trialed with the size E femur, size 4 tibia, a 12 mm insert and the 30 patella.  I had excellent flexion/extension gap balance, excellent patella tracking.  Flexion was full and beyond 120 degrees; extension was zero.  These components were chosen and the staff opened them to me on the back table while the knee was lavaged copiously and the cement mixed.  I cemented in the components and removed all excess cement.  The polyethylene tibial component was snapped into place and the knee placed in extension while cement was hardening.  The capsule was infilltrated with 20cc of .25% Marcaine with epinephrine.  A hemovac was place in the joint exiting superolaterally.  A pain pump was place superomedially superficial to the arthrotomy.  Once the cement was hard, the tourniquet was let down.  Hemostasis was obtained.  The arthrotomy was closed with figure-8 #1 vicryl sutures.  The deep soft tissues were closed with #0 vicryls and the subcuticular layer closed with a running #2-0 vicryl.  The skin was reapproximated and closed with skin staples.  The wound was dressed with xeroform, 4 x4's, 2 ABD sponges, a single layer of webril and a TED stocking.   The patient was then awakened, extubated, and taken to the recovery room in stable condition.  BLOOD LOSS:  300cc DRAINS: 1 hemovac, 1 pain catheter COMPLICATIONS:  None.  PLAN OF CARE: Admit to inpatient   PATIENT DISPOSITION:  PACU - hemodynamically stable.   Delay start of Pharmacological VTE agent (>24hrs) due to surgical blood loss or risk of bleeding:  not applicable  Please fax a copy of this op note to my office at [phone] (please only include page 1 and 2 of the Case Information op note)

## 2012-07-24 ENCOUNTER — Ambulatory Visit (HOSPITAL_COMMUNITY): Payer: Managed Care, Other (non HMO) | Admitting: Physical Therapy

## 2012-07-31 ENCOUNTER — Ambulatory Visit (HOSPITAL_COMMUNITY)
Admission: RE | Admit: 2012-07-31 | Discharge: 2012-07-31 | Disposition: A | Discharge status: Home / Self Care | Payer: Managed Care, Other (non HMO) | Source: Ambulatory Visit | Attending: Orthopedic Surgery | Admitting: Orthopedic Surgery

## 2012-07-31 DIAGNOSIS — M25569 Pain in unspecified knee: Secondary | ICD-10-CM | POA: Insufficient documentation

## 2012-07-31 DIAGNOSIS — M25669 Stiffness of unspecified knee, not elsewhere classified: Secondary | ICD-10-CM | POA: Insufficient documentation

## 2012-07-31 DIAGNOSIS — M6281 Muscle weakness (generalized): Secondary | ICD-10-CM | POA: Insufficient documentation

## 2012-07-31 DIAGNOSIS — R262 Difficulty in walking, not elsewhere classified: Secondary | ICD-10-CM | POA: Insufficient documentation

## 2012-07-31 DIAGNOSIS — IMO0001 Reserved for inherently not codable concepts without codable children: Secondary | ICD-10-CM | POA: Insufficient documentation

## 2012-07-31 NOTE — Evaluation (Signed)
Physical Therapy Evaluation  Patient Details  Name: Kimberly Love MRN: [id number] Date of Birth: 1951/06/02  Today's Date: 07/31/2012 Time: 4098-1191 PT Time Calculation (min): 37 min Charges: 1 eval, 10' TE Visit#: 1  of 8   Re-eval: 07/31/12 Assessment Diagnosis: L TKR Surgical Date: 07/02/12 Next MD Visit: Dr. Sherlean Foot - Decemeber 16 Prior Therapy: For R TKR  Past Medical History:  Past Medical History  Diagnosis Date  . Anxiety   . Depression   . Hypertension     dr Timothy Lasso   [facility]  [place]  . Chronic kidney disease   . Seizures     x1- 1973- post childbirth   . Arthritis     OA- L knee   . Complication of anesthesia     had seizure in 1973  while waking up, after childbirth - vaginal  birth   . PONV (postoperative nausea and vomiting)    Past Surgical History:  Past Surgical History  Procedure Date  . Tonsillectomy   . Cholecystectomy   . Breast surgery     bio  2012   neg  . Orthorscopy     rt knee  . Total knee arthroplasty 12/26/2011    Procedure: TOTAL KNEE ARTHROPLASTY;  Surgeon: Raymon Mutton, MD;  Location: [facility] OR;  Service: Orthopedics;  Laterality: Right;  right total knee arthroplasty  . Abdominal hysterectomy     partial   . Tubal ligation   . Total knee arthroplasty 07/02/2012    left knee  . Total knee arthroplasty 07/02/2012    Procedure: TOTAL KNEE ARTHROPLASTY;  Surgeon: Raymon Mutton, MD;  Location: [facility] OR;  Service: Orthopedics;  Laterality: Left;  left total knee replacement    Subjective Symptoms/Limitations Pertinent History: Pt is referred to PT for L TKR on 07/02/12.  She had a R TKR in May 2013 and was able to return to work in August without difficulty to R LE, however continued to have pain to her L LE when returning and decided to have her L knee replaced to improve her QOL.  She plans to RTW in February.  her c/co is decreased independence with gait, moderate pain to anterior knee and difficulty straightening her knee.  How  long can you stand comfortably?: 30-45 minutes  How long can you walk comfortably?: w/SPC: 1-2 hours Patient Stated Goals: walk independently, RTW and not limp.  Pain Assessment Currently in Pain?: Yes Pain Score:   3 (range: 0-5/10) Pain Location: Knee Pain Orientation: Left;Anterior Pain Type: Acute pain;Surgical pain Pain Relieving Factors: riding her bike, tramodol before exercise.  Effect of Pain on Daily Activities: unable to currently work.  Prior Functioning  Prior Function Vocation: Full time employment Vocation Requirements: Planning on going back in February to working on the [employer].  She went back after her R TKR without pain to her R.  Cognition/Observation Observation/Other Assessments Observations: improper mechanics with squatting technique Other Assessments: decreased L gastroc and solues flexibility  Sensation/Coordination/Flexibility/Functional Tests Functional Tests Functional Tests: LEFS: 37/80  Assessment LLE AROM (degrees) Left Knee Extension: 4  Left Knee Flexion: 114  LLE PROM (degrees) Left Knee Extension: 2 Left Knee Flexion: 118 LLE Strength Left Hip Flexion: 3+/5 Left Hip Extension: 3/5 Left Hip ABduction: 3+/5 Left Knee Flexion: 4/5 Left Knee Extension: 4/5 Palpation Palpation: increased pain and tenderness to the anterior knee and L gastroc without warmth.   Mobility/Balance  Ambulation/Gait Ambulation/Gait: Yes Assistive device: Straight cane Gait Pattern: Decreased stance time -  left;Decreased hip/knee flexion - left;Left foot flat;Trunk flexed;Decreased trunk rotation Static Standing Balance Single Leg Stance - Right Leg: 5  Single Leg Stance - Left Leg: 5  Tandem Stance - Right Leg: 10  Tandem Stance - Left Leg: 10    Exercise/Treatments Stretches Gastroc Stretch: 1 rep;30 seconds Standing Heel Raises: 10 reps;Other (comment) (Toe raises 10 reps) Supine Quad Sets: Left;5 reps Heel Slides: Left;5 reps Sidelying Hip  ABduction: AAROM;Left;10 reps Prone  Hip Extension: Left;10 reps Other Prone Exercises: TKE 10 reps  Physical Therapy Assessment and Plan PT Assessment and Plan Clinical Impression Statement: Pt is referred to PT s/p L TKR on 07/02/12.  has previous therapy after R TKR in April 2013 and was able to return to work. Pt will benefit from skilled therapeutic intervention in order to improve on the following deficits: Abnormal gait;Decreased balance;Pain;Decreased strength;Decreased range of motion;Decreased activity tolerance;Impaired perceived functional ability;Impaired flexibility Rehab Potential: Good PT Frequency: Min 2X/week PT Duration: 8 weeks (secondary to RTW requirements) PT Treatment/Interventions: DME instruction;Gait training;Stair training;Therapeutic activities;Therapeutic exercise;Balance training;Neuromuscular re-education;Patient/family education;Manual techniques PT Plan: Continue with general LE strengthening (standing knee flexion, standing TKE, stool scoots), balance training (SLS), gait mechanics to decrease trunk flexion and trendlenberg gait, manual techniques to improve ROM (extension > flexion).  Progress toward work hardening in order to RTW.     Goals Home Exercise Program Pt will Perform Home Exercise Program: Independently PT Goal: Perform Home Exercise Program - Progress: Goal set today PT Short Term Goals Time to Complete Short Term Goals: 2 weeks PT Short Term Goal 1: Pt will improve her L knee AROM 3-118  degrees. PT Short Term Goal 2: Pt will improve LE strength by 1 muscle grade. PT Short Term Goal 3: Pt will improve her LE strength in order to ambulate independently with mild gait impairments. PT Short Term Goal 4: Pt will improve L and R SLS x15 sec on static surface.  PT Long Term Goals Time to Complete Long Term Goals: 8 weeks PT Long Term Goal 1: Pt will improve her LE strength to WNL in order to ascend and descend 10 stairs w/1 handrail and to  demonstrate safe stepping onto 10 inch surface to climb into fork lift.   Problem List Patient Active Problem List  Diagnosis  . Respiratory acidosis  . Acute respiratory failure  . Altered mental status  . Hypoxemia  . Difficulty in walking  . Knee pain  . Knee stiffness    PT Plan of Care PT Home Exercise Plan: see scanned report Consulted and Agree with Plan of Care: Patient  Annett Fabian, PT 07/31/2012, 2:50 PM  Physician Documentation Your signature is required to indicate approval of the treatment plan as stated above.  Please sign and either send electronically or make a copy of this report for your files and return this physician signed original.   Please mark one 1.__approve of plan  2. ___approve of plan with the following conditions.   ______________________________                                                          _____________________ Physician Signature  Date  

## 2012-08-02 ENCOUNTER — Ambulatory Visit (HOSPITAL_COMMUNITY): Payer: Managed Care, Other (non HMO)

## 2012-08-03 ENCOUNTER — Ambulatory Visit (HOSPITAL_COMMUNITY)
Admission: RE | Admit: 2012-08-03 | Discharge: 2012-08-03 | Disposition: A | Discharge status: Home / Self Care | Payer: Managed Care, Other (non HMO) | Source: Ambulatory Visit | Attending: Orthopedic Surgery | Admitting: Orthopedic Surgery

## 2012-08-03 NOTE — Progress Notes (Addendum)
Physical Therapy Treatment Patient Details  Name: Kimberly Love MRN: [id number] Date of Birth: [birth year]  Today's Date: 08/03/2012 Time: 7829-5621 PT Time Calculation (min): 44 min  Visit#: 2  of 8   Re-eval: 08/28/12 Assessment Diagnosis: L TKR Surgical Date: 07/02/12 Next MD Visit: Dr. Sherlean Foot - Decemeber 16 Prior Therapy: For R TKR Charge: Gait 15', therex 28'  Subjective: Symptoms/Limitations Symptoms: Pt reported L knee not feeling too bad today pain scale 2 1/2-3/10.  Pt proud of herself, got into bathtub for the first time with no assistance today.  Pt compliant with HEP had one question with proper form with gastroc st, pt instructed proper form.  Pt.reported riding bicycle, stair training and prone knee hang x 8 min at home. Pain Assessment Currently in Pain?: Yes Pain Score:   3 Pain Location: Knee Pain Orientation: Left  Objective:   Exercise/Treatments Stretches Active Hamstring Stretch: 2 reps;30 seconds Gastroc Stretch: 2 reps;30 seconds Standing Knee Flexion: Left;15 reps Terminal Knee Extension: 10 reps;Theraband Theraband Level (Terminal Knee Extension): Level 4 (Blue) Functional Squat: 15 reps SLS: R 9", L 6" max of 3 Other Standing Knee Exercises: church pew for quad activation 10x Gait training for equal stride length and to decrease forward trunk x 15' Seated Stool Scoot - Round Trips: 1 RT on carpet push and pull Supine Quad Sets: Left;10 reps Patellar Mobs: A/P, R/L and tib/fib to reduce pain Knee Extension: PROM;2 sets;Limitations Knee Extension Limitations: 2x 30"      Physical Therapy Assessment and Plan PT Assessment and Plan Clinical Impression Statement: Began POC per PT plan for improved gait mechanics, LE strengthening and to improve ROM.  Focus session with distal quad activation for increased extension with manual facilitation to improve gait mechanics.  Pt able to complete all exercises correctly following cues for technique.   Improved gait mechanics noted at end of session following cueing for knee extension with heel touch and to decrease trunk flexion. PT Plan: Continue wtih general LE strengthening, balance training, gait training with good posture and decreased trendlenberg gait, manual techniques to improve ROM (focus on extension >flexion).  Progress towards functional activities for RTW.    Goals    Problem List Patient Active Problem List  Diagnosis  . Respiratory acidosis  . Acute respiratory failure  . Altered mental status  . Hypoxemia  . Difficulty in walking  . Knee pain  . Knee stiffness    PT - End of Session Activity Tolerance: Patient tolerated treatment well General Behavior During Session: [facility] for tasks performed Cognition: [facility] for tasks performed  GP    [name] 08/03/2012, 6:48 PM

## 2012-08-07 ENCOUNTER — Inpatient Hospital Stay (HOSPITAL_COMMUNITY): Admission: RE | Admit: 2012-08-07 | Payer: Managed Care, Other (non HMO) | Source: Ambulatory Visit

## 2012-08-09 ENCOUNTER — Ambulatory Visit (HOSPITAL_COMMUNITY)
Admission: RE | Admit: 2012-08-09 | Discharge: 2012-08-09 | Disposition: A | Discharge status: Home / Self Care | Payer: Managed Care, Other (non HMO) | Source: Ambulatory Visit | Attending: Orthopedic Surgery | Admitting: Orthopedic Surgery

## 2012-08-09 NOTE — Progress Notes (Signed)
Physical Therapy Treatment Patient Details  Name: Kimberly Love MRN: [id number] Date of Birth: [birth year]  Today's Date: 08/09/2012 Time: 1355-1440 PT Time Calculation (min): 45 min  Visit#: 3  of 8   Re-eval: 08/28/12 Assessment Diagnosis: L TKR Surgical Date: 07/02/12 Next MD Visit: Dr. Sherlean Foot - Decemeber 16 Prior Therapy: For R TKR Charge: Manual 12', Gait training x 10', therex 23'  Subjective: Symptoms/Limitations Symptoms: Pt reported she feeling better today, has taken no pain meds.  L knee pain scale 1 1/2 out of 10. Pain Assessment Currently in Pain?: Yes Pain Score:   2 Pain Location: Knee Pain Orientation: Left  Objective:   Exercise/Treatments Stretches Active Hamstring Stretch: 3 reps;30 seconds;Limitations Active Hamstring Stretch Limitations: with rope Gastroc Stretch: 2 reps;30 seconds;Limitations Gastroc Stretch Limitations: 1 with slant board, 1 w/o Aerobic Stationary Bike: 6' @ 1.0 for activity tolerance and ROM Standing Knee Flexion: Left;15 reps Terminal Knee Extension: 15 reps;Theraband;Limitations Theraband Level (Terminal Knee Extension): Level 4 (Blue) Terminal Knee Extension Limitations: 10" holds Functional Squat: 15 reps SLS: R 15" L 9" Gait Training: Gait training for heel to toe with emphasis on TKE with heel touch and posture Supine Quad Sets: Left;10 reps Terminal Knee Extension: 5 reps;Limitations Terminal Knee Extension Limitations: 5" holds Patellar Mobs: A/P, R/L and tib/fib to reduce pain Knee Extension: Limitations Knee Extension Limitations: Held today due to pain   Manual Therapy Manual Therapy: Myofascial release Myofascial Release: To hamstrings and ITBand  insertion and gastroc origin to reduce spams/pain and increase knee extension x 12'  Physical Therapy Assessment and Plan PT Assessment and Plan Clinical Impression Statement: Pt improving gait mechanics with less cueing to reduce forward trunk and knee  extension with heel touch.  Pt limited by pain due to no pain meds prior therapy this session, held PROM.  Completed manual MFR/STM to hamstring and ITBand insertion and gastroc origin to reduce spams for pain reduction and to increase ROM.  Pt stated ice increases pain, pt given sample of biofreeze with instant pain reduction and improved gait mechanics following pain reduction. PT Plan: Continue wtih general LE strengthening, balance training, gait training with good posture and decreased trendlenberg gait, manual techniques to improve ROM (focus on extension >flexion). Progress towards functional activities for RTW.  Begin gait training on TM next session for improved gait mechanics.    Goals    Problem List Patient Active Problem List  Diagnosis  . Respiratory acidosis  . Acute respiratory failure  . Altered mental status  . Hypoxemia  . Difficulty in walking  . Knee pain  . Knee stiffness    PT - End of Session Activity Tolerance: Patient tolerated treatment well;Patient limited by pain General Behavior During Session: [facility] for tasks performed Cognition: [facility] for tasks performed  GP    [name] 08/09/2012, 3:00 PM

## 2012-08-10 ENCOUNTER — Ambulatory Visit (HOSPITAL_COMMUNITY): Payer: Managed Care, Other (non HMO)

## 2012-08-15 ENCOUNTER — Ambulatory Visit (HOSPITAL_COMMUNITY)
Admission: RE | Admit: 2012-08-15 | Discharge: 2012-08-15 | Disposition: A | Discharge status: Home / Self Care | Payer: Managed Care, Other (non HMO) | Source: Ambulatory Visit | Attending: Orthopedic Surgery | Admitting: Orthopedic Surgery

## 2012-08-15 NOTE — Progress Notes (Signed)
Physical Therapy Treatment Patient Details  Name: Kimberly Love MRN: [id number] Date of Birth: 1951/05/10  Today's Date: 08/15/2012 Time: 4098-1191 PT Time Calculation (min): 42 min  Visit#: 4  of 8   Re-eval: 08/28/12  Charge: gait 8', therex 32'   Subjective: Symptoms/Limitations Symptoms: Pt reported MD happy with knee's progress and ROM does want pt to continue with OPPT to improve gait mechanics and strength.  Pt pain free but has taken IBProfen prior therapy today. Pain Assessment Currently in Pain?: No/denies  Objective:  Exercise/Treatments Stretches Knee: Self-Stretch to increase Flexion: 1 rep;30 seconds Gastroc Stretch: 3 reps;30 seconds Aerobic Tread Mill: Gait training .55 --> .60 x 6 min cueing for posture and to equalize/increase stride length Standing Knee Flexion: Left;15 reps Lateral Step Up: Left;15 reps;Hand Hold: 1;Step Height: 4" Forward Step Up: Left;15 reps;Hand Hold: 0;Step Height: 4" Step Down: Left;Hand Hold: 0;Step Height: 2";10 reps;Step Height: 4";5 reps Functional Squat: 15 reps SLS: R 11", L 8" 3x 30" with 1 finger    Physical Therapy Assessment and Plan PT Assessment and Plan Clinical Impression Statement: Session focus on improving gait mechanics, began gait training on TM for equalize stance phase and stride length, pt with good gait mechanics but did require cueing to reduce forward trunk for posture.  Began stair training with manual cues to reduce hip hike with lateral step up and for quad strengthening. PT Plan: Continue wtih general LE strengthening, balance training, gait training with good posture and decreased trendlenberg gait.  Progress towards functional activities for RTW    Goals    Problem List Patient Active Problem List  Diagnosis  . Respiratory acidosis  . Acute respiratory failure  . Altered mental status  . Hypoxemia  . Difficulty in walking  . Knee pain  . Knee stiffness    PT - End of  Session Activity Tolerance: Patient tolerated treatment well General Behavior During Session: [facility] for tasks performed Cognition: [facility] for tasks performed  GP    [name] 08/15/2012, 4:15 PM

## 2012-08-17 ENCOUNTER — Inpatient Hospital Stay (HOSPITAL_COMMUNITY): Admission: RE | Admit: 2012-08-17 | Payer: Managed Care, Other (non HMO) | Source: Ambulatory Visit

## 2012-08-24 ENCOUNTER — Ambulatory Visit (HOSPITAL_COMMUNITY): Payer: Managed Care, Other (non HMO)

## 2012-08-28 ENCOUNTER — Ambulatory Visit (HOSPITAL_COMMUNITY)
Admission: RE | Admit: 2012-08-28 | Discharge: 2012-08-28 | Disposition: A | Discharge status: Home / Self Care | Payer: Managed Care, Other (non HMO) | Source: Ambulatory Visit

## 2012-08-28 NOTE — Progress Notes (Signed)
Physical Therapy Treatment Patient Details  Name: DESHANAE LINDO MRN: [id number] Date of Birth: 07/07/1951  Today's Date: 08/28/2012 Time: 1245-1315 PT Time Calculation (min): 30 min  Visit#: 5  of 8   Re-eval:   Assessment Diagnosis: L TKR Surgical Date: 07/02/12 Next MD Visit: Dr. Sherlean Foot - 09/11/12 Prior Therapy: For R TKR Charge: NMR 30'  Subjective: Symptoms/Limitations Symptoms: Pt 15 min for apt today.  Stated pain is minor, nothing IBProfen can't fix. Pain Assessment Currently in Pain?: No/denies  Objective:   Exercise/Treatments Aerobic Stationary Bike: 6' @ 4.0 for activity tolerance and ROM Standing Rocker Board: 2 minutes SLS: R 12", L 14" max of3 Other Standing Knee Exercises: tandem stance 4x 30", tandem gait 1 RT Other Standing Knee Exercises: 6 in hurdles 3 RT  Physical Therapy Assessment and Plan PT Assessment and Plan Clinical Impression Statement: Session focus on balance training, pt originally required min-mod assistance with tandem stance and gait with cueing to improve spatial awareness, pt able to demonstrate ncreased ease following cues. PT Plan: Re-eval with evaluating therapist next session.    Goals    Problem List Patient Active Problem List  Diagnosis  . Respiratory acidosis  . Acute respiratory failure  . Altered mental status  . Hypoxemia  . Difficulty in walking  . Knee pain  . Knee stiffness    PT - End of Session Activity Tolerance: Patient tolerated treatment well General Behavior During Session: [facility] for tasks performed Cognition: [facility] for tasks performed  GP    [name] 08/28/2012, 2:23 PM

## 2012-08-30 ENCOUNTER — Ambulatory Visit (HOSPITAL_COMMUNITY)
Admission: RE | Admit: 2012-08-30 | Discharge: 2012-08-30 | Disposition: A | Discharge status: Home / Self Care | Payer: Managed Care, Other (non HMO) | Source: Ambulatory Visit | Attending: Orthopedic Surgery | Admitting: Orthopedic Surgery

## 2012-08-30 DIAGNOSIS — M25669 Stiffness of unspecified knee, not elsewhere classified: Secondary | ICD-10-CM | POA: Insufficient documentation

## 2012-08-30 DIAGNOSIS — IMO0001 Reserved for inherently not codable concepts without codable children: Secondary | ICD-10-CM | POA: Insufficient documentation

## 2012-08-30 DIAGNOSIS — R262 Difficulty in walking, not elsewhere classified: Secondary | ICD-10-CM | POA: Insufficient documentation

## 2012-08-30 DIAGNOSIS — M6281 Muscle weakness (generalized): Secondary | ICD-10-CM | POA: Insufficient documentation

## 2012-08-30 DIAGNOSIS — M25569 Pain in unspecified knee: Secondary | ICD-10-CM | POA: Insufficient documentation

## 2012-08-30 NOTE — Evaluation (Signed)
Physical Therapy Re-evaluation  Patient Details  Name: Kimberly Love MRN: [id number] Date of Birth: July 22, 1951  Today's Date: 08/30/2012 Time: 1102-1155 PT Time Calculation (min): 53 min Charges: 1 ROM, 1 MMT, 25' Self Care, 10' TE Visit#: 6  of 14   Re-eval: 09/29/12 Assessment Diagnosis: L TKR Surgical Date: 07/02/12 Next MD Visit: Dr. Sherlean Foot - 09/11/12 Prior Therapy: For R TKR  Subjective Symptoms/Limitations Symptoms: Pt reports that she plans to return to work in the beginning for February. I am having greatest difficulty with my balance.  How long can you stand comfortably?: 1 hour How long can you walk comfortably?: independently 30 minutes   Sensation/Coordination/Flexibility/Functional Tests Functional Tests Functional Tests: LEFS: 58/80 (was 37/80) Functional Tests: ABC: 87.5%  Assessment LLE AROM (degrees) Left Knee Extension: 4  (was 4) Left Knee Flexion: 125  (was 114) LLE PROM (degrees) Left Knee Extension: 3 (was 2) Left Knee Flexion: 122 (was 118) LLE Strength Left Hip Flexion: 4/5 (was 3+/5) Left Hip Extension: 4/5 (was 3/5) Left Hip ABduction:  (4+/5, was 3+/5) Left Knee Flexion:  (4+/5was 4/5) Left Knee Extension:  (4+/5, was 4/5) Palpation Palpation:  (increased pain and tenderness to the anterior knee and L gas)  Mobility/Balance  Ambulation/Gait Assistive device: None (was SPC) Gait Pattern: Trunk flexed;Left flexed knee in stance Static Standing Balance Single Leg Stance - Right Leg: 7  (was 5 sec) Single Leg Stance - Left Leg: 7  (was 5 sec) Tandem Stance - Right Leg: 30  (was 10 seconds) Tandem Stance - Left Leg: 30  (was 10 seconds)   Exercise/Treatments Aerobic Stationary Bike: 6' @ 4.0 for activity tolerance and ROM Standing Terminal Knee Extension: Left;10 reps Terminal Knee Extension Limitations: against wall  SLS: R and L 7 sec each Gait Training: w/heel strike and focus on weight shift Other Standing Knee Exercises:  partial tandem gait w/head rotations 5x BLE Other Standing Knee Exercises: heel walking 2 RT Supine Short Arc Quad Sets: Left;10 reps   Physical Therapy Assessment and Plan PT Assessment and Plan Clinical Impression Statement: Kimberly Love has attended 6 OP PT visits s/p L TKR with following findings: met 2/5 STG, 0/1 LTG, updated LTG today. continues to have greatest limitations with knee extension and balance.  Overall strength is improving well and is ambulating independently, however will continue to work on functional strength for safe RTW.  Pt will benefit from skilled therapeutic intervention in order to improve on the following deficits: Abnormal gait;Decreased balance;Pain;Decreased strength;Decreased range of motion Rehab Potential: Good PT Frequency: Min 2X/week PT Duration: 4 weeks PT Treatment/Interventions: Gait training;Stair training;Functional mobility training;Therapeutic activities;Therapeutic exercise;Balance training;Neuromuscular re-education;Patient/family education;Manual techniques;Modalities PT Plan: Continue with balance activities to improve strength: stairs w/o UE A, toe raises/heel raises w/o UE A, tandem/partial tandem on foam w and w/o head turns, foam    Goals Home Exercise Program Pt will Perform Home Exercise Program: Independently PT Goal: Perform Home Exercise Program - Progress: Progressing toward goal PT Short Term Goals Time to Complete Short Term Goals: 2 weeks PT Short Term Goal 1: Pt will improve her L knee AROM 3-118  degrees. PT Short Term Goal 1 - Progress: Progressing toward goal PT Short Term Goal 2: Pt will improve LE strength by 1 muscle grade. PT Short Term Goal 2 - Progress: Met PT Short Term Goal 3: Pt will improve her LE strength in order to ambulate independently with mild gait impairments. PT Short Term Goal 3 - Progress: Met PT Short Term Goal  4: Pt will improve L and R SLS x15 sec on static surface.  PT Short Term Goal 4 -  Progress: Progressing toward goal (7 sec) PT Long Term Goals Time to Complete Long Term Goals: 8 weeks PT Long Term Goal 1: Pt will improve her LE strength to WNL in order to ascend and descend 10 stairs w/1 handrail and to demonstrate safe stepping onto 10 inch surface to climb into fork lift.  PT Long Term Goal 1 - Progress: Progressing toward goal PT Long Term Goal 2: Pt will improve dynamic balance and demonstrate tandem gait on dynamic surface w/supervision. Long Term Goal 3: Pt will improve her LEFS to 68/80 and ABC to 90%  Problem List Patient Active Problem List  Diagnosis  . Respiratory acidosis  . Acute respiratory failure  . Altered mental status  . Hypoxemia  . Difficulty in walking  . Knee pain  . Knee stiffness   PT - End of Session Activity Tolerance: Patient tolerated treatment well General Behavior During Session: [facility] for tasks performed Cognition: [facility] for tasks performed PT Plan of Care PT Patient Instructions: discussed in length about improving activity tolerance and balance.   Kimberly Love, PT 08/30/2012, 12:10 PM  Physician Documentation Your signature is required to indicate approval of the treatment plan as stated above.  Please sign and either send electronically or make a copy of this report for your files and return this physician signed original.   Please mark one 1.__approve of plan  2. ___approve of plan with the following conditions.   ______________________________                                                          _____________________ Physician Signature                                                                                                             Date

## 2012-09-04 ENCOUNTER — Inpatient Hospital Stay (HOSPITAL_COMMUNITY): Admission: RE | Admit: 2012-09-04 | Payer: Managed Care, Other (non HMO) | Source: Ambulatory Visit

## 2012-09-05 ENCOUNTER — Ambulatory Visit (HOSPITAL_COMMUNITY): Payer: Managed Care, Other (non HMO)

## 2012-09-06 ENCOUNTER — Ambulatory Visit (HOSPITAL_COMMUNITY)
Admission: RE | Admit: 2012-09-06 | Discharge: 2012-09-06 | Disposition: A | Discharge status: Home / Self Care | Payer: Managed Care, Other (non HMO) | Source: Ambulatory Visit | Attending: Orthopedic Surgery | Admitting: Orthopedic Surgery

## 2012-09-06 NOTE — Progress Notes (Signed)
Physical Therapy Treatment Patient Details  Name: Kimberly Love MRN: [id number] Date of Birth: 1951/01/31  Today's Date: 09/06/2012 Time: 7829-5621 PT Time Calculation (min): 48 min  Visit#: 7  of 14   Re-eval: 09/29/12 Assessment Diagnosis: L TKR Surgical Date: 07/02/12 Next MD Visit: Dr. Sherlean Foot - 09/11/12 Prior Therapy: For R TKR Charge: NMR 38', gait 10'  Subjective: Symptoms/Limitations Symptoms: Pt stated she had fall from step stool on last Tuesday, no real pain just soreness.   Pain Assessment Currently in Pain?: Yes (soreness overall)  Objective:   Exercise/Treatments Standing Heel Raises: 15 reps;Limitations Heel Raises Limitations: no HHA Lateral Step Up: Left;15 reps;Hand Hold: 1;Step Height: 4" Forward Step Up: Left;15 reps;Hand Hold: 0;Step Height: 4" Step Down: Limitations Step Down Limitations: pt stated too painful Stairs: reciprocal pattern 1Flight/ 1 HR Rocker Board: 2 minutes;Limitations Rocker Board Limitations: R/L no HHA SLS: R 17", L 8"  Gait Training: w/heel strike and focus on weight shift Other Standing Knee Exercises: On static surface: tandem stance 2x 30", tandem gait, and retro tandem gait; on balance beam tandem stance with 10 cervical rotation; tandem gait on balance beam    Physical Therapy Assessment and Plan PT Assessment and Plan Clinical Impression Statement: Session focus on functional balance activities, pt required min-mod assistance with LOB episodes on dynamic surfaces, improved with cueing for spatial awareness.  Pt limited by pain this session from fall 2 days agoo. PT Plan: Continue with balance activities to improve strength: stairs w/o UE A, toe raises/heel raises w/o UE A, tandem/partial tandem on foam w and w/o head turns, foam    Goals    Problem List Patient Active Problem List  Diagnosis  . Respiratory acidosis  . Acute respiratory failure  . Altered mental status  . Hypoxemia  . Difficulty in walking  .  Knee pain  . Knee stiffness    PT - End of Session Equipment Utilized During Treatment: Gait belt Activity Tolerance: Patient tolerated treatment well General Behavior During Session: [facility] for tasks performed Cognition: [facility] for tasks performed  GP    [name] 09/06/2012, 4:19 PM

## 2012-09-11 ENCOUNTER — Ambulatory Visit (HOSPITAL_COMMUNITY): Payer: Managed Care, Other (non HMO)

## 2012-09-12 ENCOUNTER — Ambulatory Visit (HOSPITAL_COMMUNITY)
Admission: RE | Admit: 2012-09-12 | Discharge: 2012-09-12 | Disposition: A | Discharge status: Home / Self Care | Payer: Managed Care, Other (non HMO) | Source: Ambulatory Visit | Attending: Orthopedic Surgery | Admitting: Orthopedic Surgery

## 2012-09-12 NOTE — Progress Notes (Signed)
Physical Therapy Treatment Patient Details  Name: Kimberly Love MRN: [id number] Date of Birth: 15-Apr-1951  Today's Date: 09/12/2012 Time: 4098-1191 PT Time Calculation (min): 41 min Charges: 15' NMR. 25' TE Visit#: 8  of 14   Re-eval: 09/29/12   Subjective: Symptoms/Limitations Symptoms: Continues to have some soreness from her fall.  states dr. Sherlean Foot wants to continue with hip strengthening and balance for RTW after Feb 18th. Pain Assessment Currently in Pain?: Yes Pain Score:   3 Pain Location: Knee Pain Orientation: Left  Exercise/Treatments Aerobic Tread Mill: Gait trainer x7 min. 0.71-0.81 c/cec b UE support w/cueing for posture and heel strike Machines for Strengthening Cybex Knee Extension: 2 PL w/L LE ecc lower x15 Cybex Knee Flexion: 3 PL B LE x15 Standing Lateral Step Up: Left;15 reps;Hand Hold: 1;Step Height: 4" Rocker Board: 3 minutes Rocker Board Limitations: R/L no HHA Other Standing Knee Exercises: Standing on 4in step w/RLE 3x45 sec holds Other Standing Knee Exercises: Standing BLE on foam w/ball toss x2 minutes Sidelying Hip ABduction: AROM;Left;Limitations Hip ABduction Limitations: 13 w/3# Clams: 5x10 sec holds Prone  Hip Extension: AROM;15 reps;Limitations Hip Extension Limitations: 3# Other Prone Exercises: TKE 10 reps 5 sec holds    Physical Therapy Assessment and Plan PT Assessment and Plan Clinical Impression Statement: added hip and knee strengthening and balance activities to improve LE activity tolerance.  pt had notable weakness to L hip with activities. completed gait trainer at end of session to focus on posture and heel/toe contact. PT Plan: Continue with balance activities to improve strength: stairs w/o UE A, toe raises/heel raises w/o UE A, tandem/partial tandem on foam w and w/o head turns, foam    Goals    Problem List Patient Active Problem List  Diagnosis  . Respiratory acidosis  . Acute respiratory failure  . Altered  mental status  . Hypoxemia  . Difficulty in walking  . Knee pain  . Knee stiffness    PT - End of Session Equipment Utilized During Treatment: Gait belt Activity Tolerance: Patient tolerated treatment well General Behavior During Session: [facility] for tasks performed Cognition: [facility] for tasks performed  Khyan Oats, PT 09/12/2012, 3:18 PM

## 2012-09-13 ENCOUNTER — Ambulatory Visit (HOSPITAL_COMMUNITY)
Admission: RE | Admit: 2012-09-13 | Discharge: 2012-09-13 | Disposition: A | Discharge status: Home / Self Care | Payer: Managed Care, Other (non HMO) | Source: Ambulatory Visit | Attending: Orthopedic Surgery | Admitting: Orthopedic Surgery

## 2012-09-13 NOTE — Progress Notes (Signed)
Physical Therapy Treatment Patient Details  Name: Kimberly Love MRN: [id number] Date of Birth: 23-Jun-1951  Today's Date: 09/13/2012 Time: 1302-1400 PT Time Calculation (min): 58 min  Visit#: 9  of 14   Re-eval: 09/29/12 Assessment Diagnosis: L TKR Surgical Date: 07/02/12 Next MD Visit: Dr. Sherlean Foot - 10/16/2012 Prior Therapy: For R TKR Charge: Gait 8', NMR 23', therex 27'  Subjective: Symptoms/Limitations Symptoms: Pt reported she has until the 18th of February for return to MD.  Pt stated she had a good workout yesterday, pain scale 1 1/2-2/10. Pain Assessment Currently in Pain?: Yes Pain Score:   2 Pain Location: Knee Pain Orientation: Left  Objective:   Exercise/Treatments Stretches Active Hamstring Stretch: 2 reps;30 seconds;Limitations Active Hamstring Stretch Limitations: with rope Hip Flexor Stretch: 2 reps;30 seconds;Limitations Hip Flexor Stretch Limitations: supine on edge of bed Gastroc Stretch: 2 reps;30 seconds Aerobic Tread Mill: Gait trainer x8 min. .65 cyc/sec w/ BUE support w/cueing for posture and heel strike Machines for Strengthening Cybex Knee Extension: 2 PL w/L LE ecc lower 2 x15 Cybex Knee Flexion: 3 PL B LE 2 x15 Standing Heel Raises: 15 reps;Limitations Heel Raises Limitations: no HHA with heel raises, toe raises required min assistance with LOB Lateral Step Up: Left;15 reps;Step Height: 4";Hand Hold: 0 Rocker Board: 3 minutes Rocker Board Limitations: R/L no HHA Rebounder: partial tandem stance on blue foam 20 x with red ball Other Standing Knee Exercises: Standing on 4in step w/RLE 3x45 sec holds Other Standing Knee Exercises: tandem stance 4x 30", tandem gait on balance beam, rebounder partial tandem stance 20x with red ball Sidelying Clams: 5x10 sec holds  Physical Therapy Assessment and Plan PT Assessment and Plan Clinical Impression Statement: Progressed to dynamic balance activities per PT plan, pt continues to require min  assistance for LOB episodes but less assistance required with each activity following cueing for body spatial awareness.  Improved gait mechanics noted at end of session following gait traininer on TM. PT Plan: Continue with balance activities to improve strength: stairs w/o UE A, toe raises/heel raises w/o UE A, tandem/partial tandem on foam w and w/o head turns, foam     Goals    Problem List Patient Active Problem List  Diagnosis  . Respiratory acidosis  . Acute respiratory failure  . Altered mental status  . Hypoxemia  . Difficulty in walking  . Knee pain  . Knee stiffness    PT - End of Session Equipment Utilized During Treatment: Gait belt Activity Tolerance: Patient tolerated treatment well General Behavior During Session: [facility] for tasks performed Cognition: [facility] for tasks performed  GP    [name] 09/13/2012, 2:10 PM

## 2012-09-18 ENCOUNTER — Ambulatory Visit (HOSPITAL_COMMUNITY): Payer: Managed Care, Other (non HMO)

## 2012-09-19 ENCOUNTER — Ambulatory Visit (HOSPITAL_COMMUNITY)
Admission: RE | Admit: 2012-09-19 | Discharge: 2012-09-19 | Disposition: A | Discharge status: Home / Self Care | Payer: Managed Care, Other (non HMO) | Source: Ambulatory Visit | Attending: Orthopedic Surgery | Admitting: Orthopedic Surgery

## 2012-09-19 NOTE — Progress Notes (Signed)
Physical Therapy Treatment Patient Details  Name: Kimberly Love MRN: [id number] Date of Birth: 1950/11/06  Today's Date: 09/19/2012 Time: 4696-2952 PT Time Calculation (min): 55 min  Visit#: 10  of 14   Re-eval: 09/29/12  Charge: NMR 38', therex 15'  Subjective: Symptoms/Limitations Symptoms: Pain free at beginning of session, feeling better today just a little sore. Pain Assessment Currently in Pain?: No/denies  Objective:   Exercise/Treatments Aerobic Tread Mill: Gait trainer x8 min. .65 cyc/sec w/ BUE support w/cueing for posture and heel strike Standing Lateral Step Up: Left;15 reps;Step Height: 4";Hand Hold: 0 Forward Step Up: Left;15 reps;Hand Hold: 0;Step Height: 6" Step Down: Left;10 reps;Hand Hold: 1;Step Height: 4" Other Standing Knee Exercises: Standing on 4in step w/RLE 3x45 sec holds Other Standing Knee Exercises: tandem stance R foot forward remove 1-15 on foam, tandem gait 2RT on balance beam, tandem stance with head turns on foam 10x     Physical Therapy Assessment and Plan PT Assessment and Plan Clinical Impression Statement: Added dynamic balance activities for RTW, pt required min-mod assistance with LOB episodes with tandem stance activites. PT Plan: Continue with balance activities to improve strength: stairs w/o UE A, toe raises/heel raises w/o UE A, tandem/partial tandem on foam w and w/o head turns, foam    Goals    Problem List Patient Active Problem List  Diagnosis  . Respiratory acidosis  . Acute respiratory failure  . Altered mental status  . Hypoxemia  . Difficulty in walking  . Knee pain  . Knee stiffness    PT - End of Session Equipment Utilized During Treatment: Gait belt Activity Tolerance: Patient tolerated treatment well General Behavior During Session: [facility] for tasks performed Cognition: [facility] for tasks performed  GP    [name] 09/19/2012, 4:55 PM

## 2012-09-20 ENCOUNTER — Ambulatory Visit (HOSPITAL_COMMUNITY): Payer: Managed Care, Other (non HMO)

## 2012-09-21 ENCOUNTER — Ambulatory Visit (HOSPITAL_COMMUNITY)
Admission: RE | Admit: 2012-09-21 | Discharge: 2012-09-21 | Disposition: A | Discharge status: Home / Self Care | Payer: Managed Care, Other (non HMO) | Source: Ambulatory Visit | Attending: Orthopedic Surgery | Admitting: Orthopedic Surgery

## 2012-09-21 NOTE — Progress Notes (Signed)
Physical Therapy Treatment Patient Details  Name: Kimberly Love MRN: [id number] Date of Birth: 05-12-1951  Today's Date: 09/21/2012 Time: 1535-1620 PT Time Calculation (min): 45 min  Visit#: 11  of 14   Re-eval: 09/29/12 Charges: Therex x 18' NMR x 15' Gait x 8'   Subjective: Symptoms/Limitations Symptoms: Pt reprots slight pain in L lateral knee. Pain Assessment Currently in Pain?: Yes Pain Score:   2 Pain Location: Knee Pain Orientation: Left;Lateral   Exercise/Treatments Aerobic Tread Mill: Gait trainer x8 min. .65 cyc/sec w/ BUE support w/cueing for posture and heel strike Machines for Strengthening Cybex Knee Extension: 2 PL w/L LE ecc lower 2 x15 Cybex Knee Flexion: 3 PL B LE 2 x15 Standing Heel Raises: 15 reps;Limitations Heel Raises Limitations: no HHA with heel raises, toe raises x 15 HHA PRN Lateral Step Up: Left;15 reps;Step Height: 4";Hand Hold: 0 Forward Step Up: Left;15 reps;Hand Hold: 0;Step Height: 6" Step Down: 15 reps;Left;Hand Hold: 1;Step Height: 4" Other Standing Knee Exercises: Tandem gait on balance beam 2 RT; # 1-15 on balance beam 1 RT   Manual Therapy Manual Therapy: Joint mobilization Joint Mobilization: Grade I-III A/P jt mobs to L knee to decrease pain  Physical Therapy Assessment and Plan PT Assessment and Plan Clinical Impression Statement: Pt tolerates increased speed with gait training with minimal difficulty. Pt requires vc's for posture with gait training. Pt completes all therex and NMR activities without complaint of increased pain. When pt stood to leave therapy she complained of a catch in her knee. Jt mobs were completed and pain subsided. Pt reports 0/10 pain at end of session. PT Plan: Continue with balance and strengthening activities per PT POC.      Problem List Patient Active Problem List  Diagnosis  . Respiratory acidosis  . Acute respiratory failure  . Altered mental status  . Hypoxemia  . Difficulty in  walking  . Knee pain  . Knee stiffness    PT - End of Session Equipment Utilized During Treatment: Gait belt Activity Tolerance: Patient tolerated treatment well General Behavior During Session: [facility] for tasks performed Cognition: [facility] for tasks performed  Seth Bake, PTA 09/21/2012, 4:31 PM

## 2012-09-25 ENCOUNTER — Ambulatory Visit (HOSPITAL_COMMUNITY)
Admission: RE | Admit: 2012-09-25 | Discharge: 2012-09-25 | Disposition: A | Discharge status: Home / Self Care | Payer: Managed Care, Other (non HMO) | Source: Ambulatory Visit | Attending: Orthopedic Surgery | Admitting: Orthopedic Surgery

## 2012-09-25 NOTE — Progress Notes (Signed)
Physical Therapy Treatment Patient Details  Name: Kimberly Love MRN: [id number] Date of Birth: [birth year]  Today's Date: 09/25/2012 Time: 1302-1359 PT Time Calculation (min): 57 min  Visit#: 12  of 14   Re-eval: 09/29/12  Charge: Gait 10', NMR x 12', MHP x 15  Subjective: Symptoms/Limitations Symptoms: Pt reported a fall from shower earlier today, trying to shave legs.   Pain Assessment Currently in Pain?: Yes Pain Score:   5 Pain Location: Knee Pain Orientation: Left;Anterior  Objective:   Exercise/Treatments Aerobic Tread Mill: Gait trainer [phone]" min. .70 cyc/sec w/ BUE support w/cueing for posture and heel strike Machines for Strengthening Cybex Knee Extension: 1.5 PL w/ L LE ecc lower x 6 Standing SLS with Vectors: 3x 5" with 1 HHA Gait Training: Gait training outdoors on slopes and dynamic surfaces  Other Standing Knee Exercises: Tandem stance 3x 30"  Modalities Modalities: Electrical Stimulation;Moist Heat Moist Heat Therapy Number Minutes Moist Heat: 10 Minutes Moist Heat Location:  (B knees) Electrical Stimulation Electrical Stimulation Location: L knee Electrical Stimulation Action: reduce pain Electrical Stimulation Parameters: IFES hi/lo sweep 13V x 2' Electrical Stimulation Goals: Pain  Physical Therapy Assessment and Plan PT Assessment and Plan Clinical Impression Statement: Pt limited by pain this session following fall earlier today.  Pt with antalgic gait with gait training on TM, ended early due to increased limping.  Gait training completed outdoors end of session for pt safety with no LOB episodes on slopes and dynamic surfaces.  Trial with estim, ended early due to increased pain with modality and pt stated ice increases pain as well.  MHP applied for pain relief and session focus on balance activites  PT Plan: Continue with balance and strengthening activities per PT POC    Goals    Problem List Patient Active Problem List  Diagnosis    . Respiratory acidosis  . Acute respiratory failure  . Altered mental status  . Hypoxemia  . Difficulty in walking  . Knee pain  . Knee stiffness    PT - End of Session Activity Tolerance: Patient limited by pain General Behavior During Session: [facility] for tasks performed Cognition: [facility] for tasks performed  GP    [name] 09/25/2012, 3:20 PM

## 2012-09-27 ENCOUNTER — Ambulatory Visit (HOSPITAL_COMMUNITY)
Admission: RE | Admit: 2012-09-27 | Discharge: 2012-09-27 | Disposition: A | Discharge status: Home / Self Care | Payer: Managed Care, Other (non HMO) | Source: Ambulatory Visit | Attending: Orthopedic Surgery | Admitting: Orthopedic Surgery

## 2012-09-27 IMAGING — CR DG CHEST 1V PORT
1 series · 1 of 1 positions shown · non-contrast
Comparison: 12/29/2011

CLINICAL DATA: Smoker.  Chest pain.  Or

PORTABLE CHEST - 1 VIEW

[view not recorded]
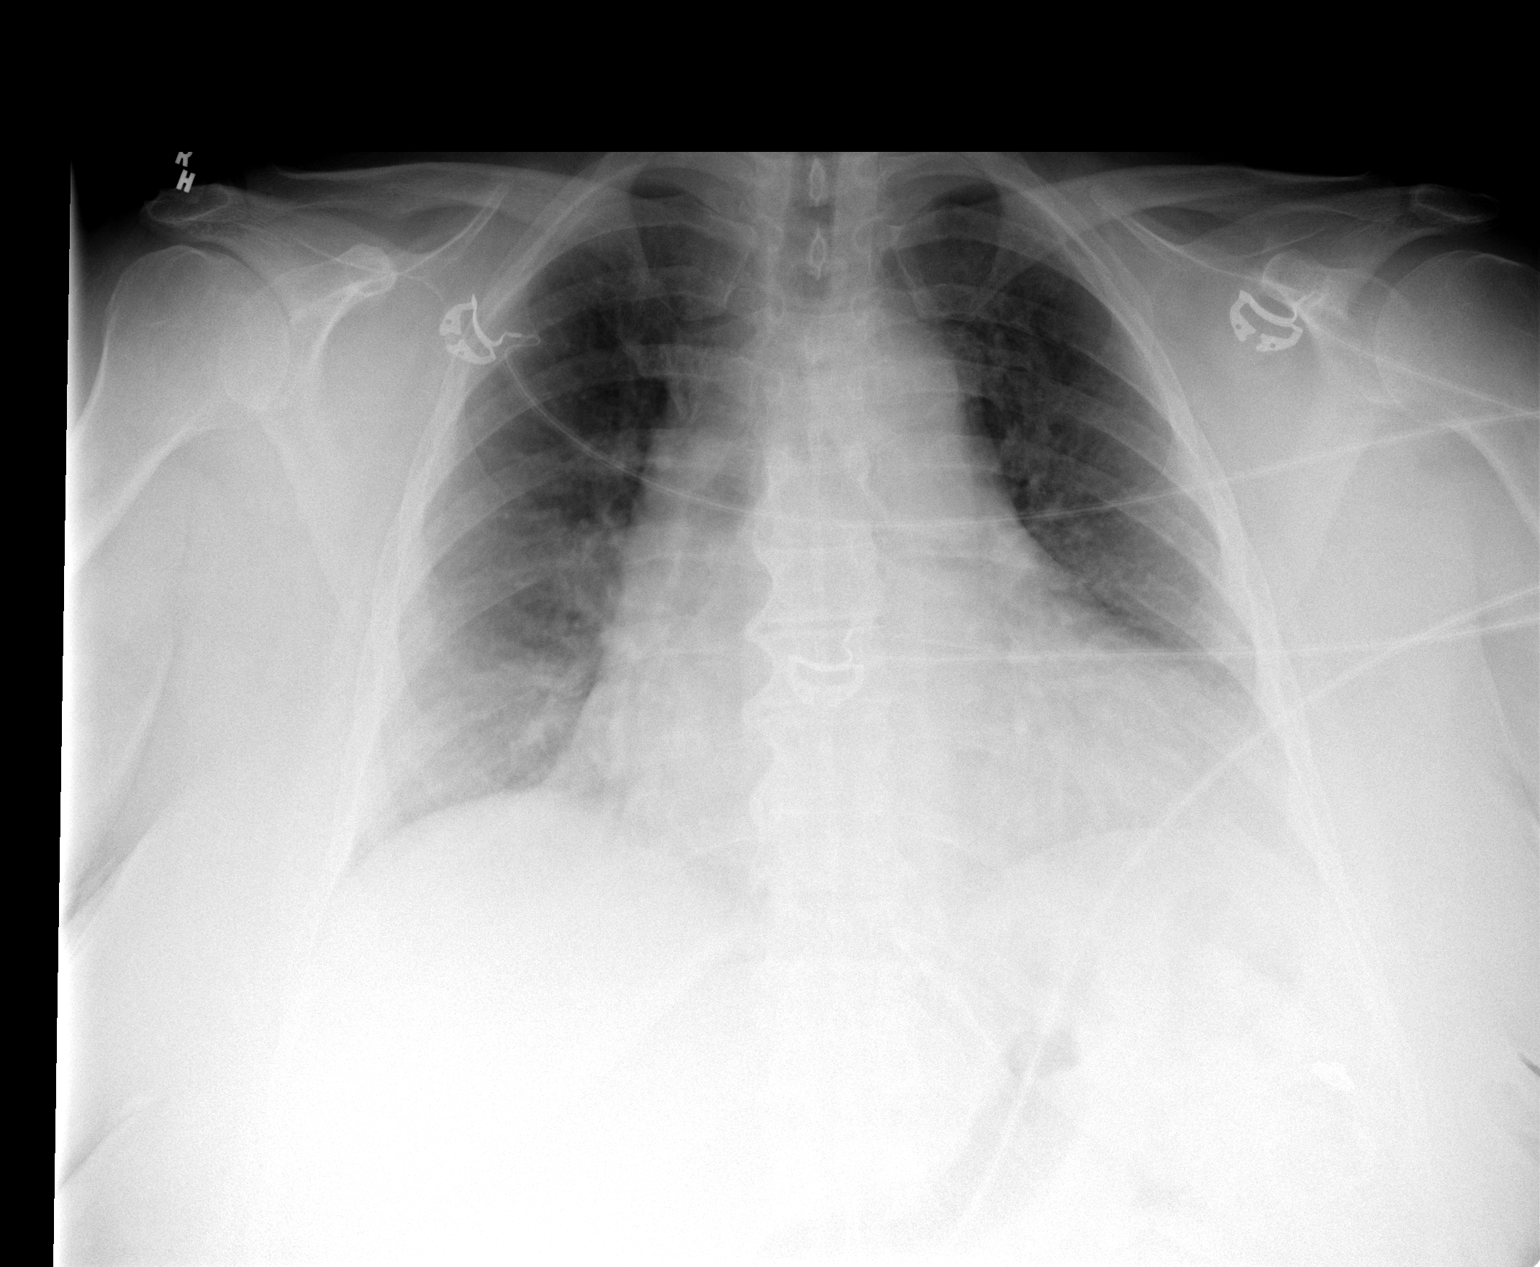

[1 of 1 positions shown; findings below may reference images not displayed]

FINDINGS: Cardiac silhouette is mildly enlarged.  No mediastinal or
hilar mass or adenopathy.  Clear lungs.  Bony thorax is intact.  No
change.
IMPRESSION: No acute cardiopulmonary disease

## 2012-09-27 IMAGING — CT CT ANGIO CHEST
1 of 6 series · 5 of 36 positions shown · IV contrast (Omnipaque 300)
Comparison: None.

CLINICAL DATA: Left-sided chest pain radiating into left arm.
Diaphoresis and shortness of breath.

CT ANGIOGRAPHY CHEST
TECHNIQUE: Multidetector CT imaging of the chest using the
standard protocol during bolus administration of intravenous
contrast. Multiplanar reconstructed images including MIPs were
obtained and reviewed to evaluate the vascular anatomy.
Contrast: 100mL OMNIPAQUE IOHEXOL 350 MG/ML SOLN

[Series 4: pe 3.0 b40f · axial · 0.65mm/px · z∈[-292,-124]mm · 5 of 84 slices shown]
[im 14/84  lung]
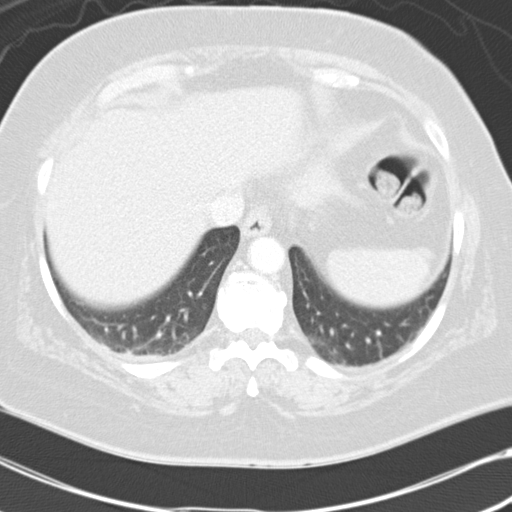
[im 28/84  mediastinal]
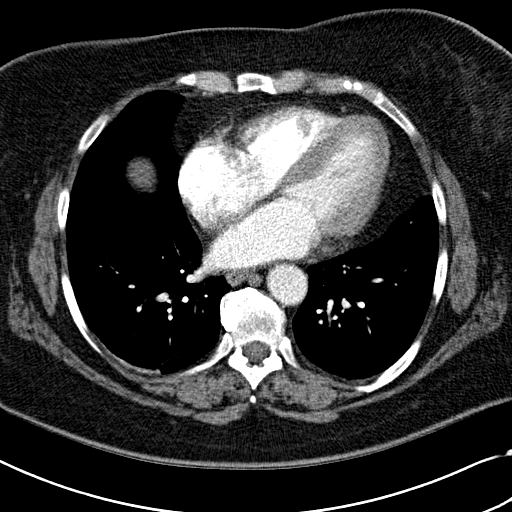
[im 42/84  lung]
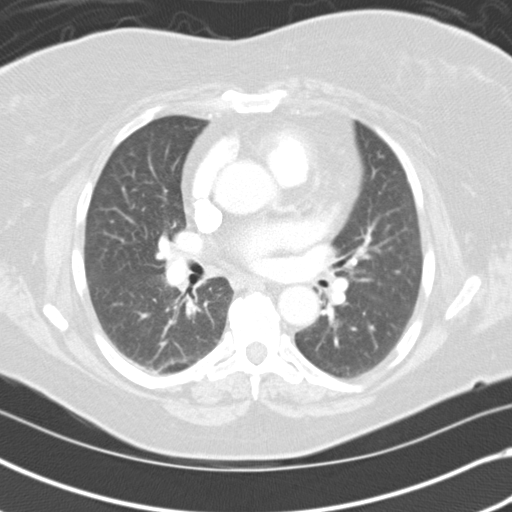
[im 56/84  mediastinal]
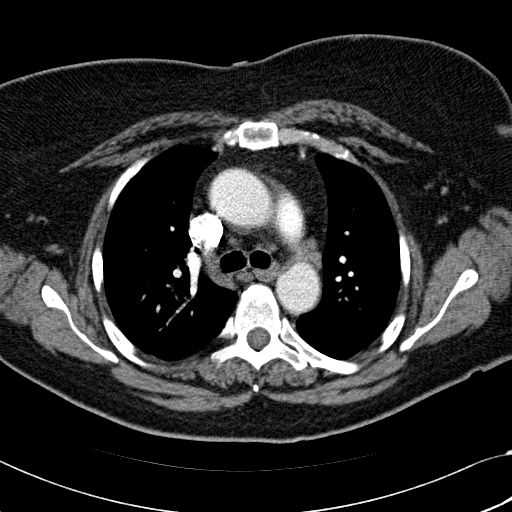
[im 70/84  lung]
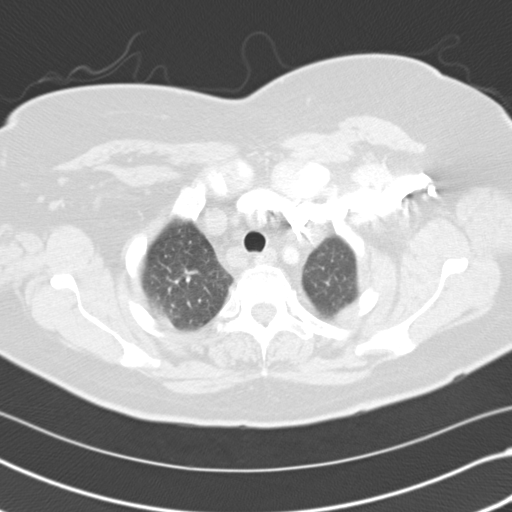

[5 of 36 positions shown; findings below may reference images not displayed]

FINDINGS: The pulmonary arteries are adequately opacified.  There
is no evidence of pulmonary embolism.  The thoracic aorta is of
normal caliber.  Top normal heart size.  No evidence of edema or
infiltrates. No pulmonary nodules are identified.  Two adjacent
mildly prominent right paratracheal lymph nodes identified with the
larger measuring 1.2 cm in short axis.  No other enlarged lymph
nodes.  No evidence of pericardial or pleural fluid.  Bony
structures are unremarkable.
IMPRESSION: No evidence of pulmonary embolism or other acute findings.  Mildly
prominent right paratracheal lymph nodes without additional
lymphadenopathy or pulmonary nodules. This is a nonspecific
finding.

## 2012-09-27 NOTE — Progress Notes (Signed)
Physical Therapy Treatment Patient Details  Name: Kimberly Love MRN: [id number] Date of Birth: 11-04-1950  Today's Date: 09/27/2012 Time: 1520-1600 PT Time Calculation (min): 40 min  Visit#: 13  of 14   Re-eval: 09/29/12 (next session)  Charge: Gait 10', NMR 30'  Subjective: Symptoms/Limitations Symptoms: Pt reported L knee little sore, no bruise from fall last Tuesday Pain Assessment Currently in Pain?: Yes Pain Score:  (soreness) Pain Location: Knee Pain Orientation: Left  Objective:  Exercise/Treatments Aerobic Tread Mill: 1.7 x 10'  Machines for Strengthening Cybex Knee Extension: 2 PL w/ L LE ecc lower  Standing Lunge Walking - Round Trips: 1 RT SLS: R 22", L 18" SLS with Vectors: 3x 5" with 1 HHA Other Standing Knee Exercises: Standing on 4in step w/RLE 3x60 sec holds with perturbulance Other Standing Knee Exercises: Hurdles 6 and 12in 2RT; Balance beam 2RT    Physical Therapy Assessment and Plan PT Assessment and Plan Clinical Impression Statement: Improving gait mechanics and activity tolerance, min cueing required for posture.  Overall balance is impaired but improves each set with cueing for spatial awareness.  Added lunges for overall LE functional strengthening and balance with cueing for form.   PT Plan: Re-eval next session.  Continue with balance and functional strengthening activities per PT POC.    Goals    Problem List Patient Active Problem List  Diagnosis  . Respiratory acidosis  . Acute respiratory failure  . Altered mental status  . Hypoxemia  . Difficulty in walking  . Knee pain  . Knee stiffness    PT - End of Session Equipment Utilized During Treatment: Gait belt Activity Tolerance: Patient tolerated treatment well General Behavior During Session: [facility] for tasks performed Cognition: [facility] for tasks performed  GP    [name] 09/27/2012, 4:55 PM

## 2012-10-02 ENCOUNTER — Ambulatory Visit (HOSPITAL_COMMUNITY): Payer: Managed Care, Other (non HMO) | Admitting: Physical Therapy

## 2012-10-04 ENCOUNTER — Ambulatory Visit (HOSPITAL_COMMUNITY)
Admission: RE | Admit: 2012-10-04 | Discharge: 2012-10-04 | Disposition: A | Discharge status: Home / Self Care | Payer: Managed Care, Other (non HMO) | Source: Ambulatory Visit | Attending: Orthopedic Surgery | Admitting: Orthopedic Surgery

## 2012-10-04 DIAGNOSIS — IMO0001 Reserved for inherently not codable concepts without codable children: Secondary | ICD-10-CM | POA: Insufficient documentation

## 2012-10-04 DIAGNOSIS — M25569 Pain in unspecified knee: Secondary | ICD-10-CM | POA: Insufficient documentation

## 2012-10-04 DIAGNOSIS — M25669 Stiffness of unspecified knee, not elsewhere classified: Secondary | ICD-10-CM | POA: Insufficient documentation

## 2012-10-04 DIAGNOSIS — M6281 Muscle weakness (generalized): Secondary | ICD-10-CM | POA: Insufficient documentation

## 2012-10-04 DIAGNOSIS — R262 Difficulty in walking, not elsewhere classified: Secondary | ICD-10-CM | POA: Insufficient documentation

## 2012-10-04 NOTE — Evaluation (Signed)
Physical Therapy Re-Evaluation  Patient Details  Name: Kimberly Love MRN: [id number] Date of Birth: 09-Jan-1951  Today's Date: 10/04/2012 Time: 0272-5366 PT Time Calculation (min): 42 min Charges: 1 MMT, 25' Self Care, 15' TE Visit#: 14  of 22   Re-eval: 11/03/12 (next session) Assessment Diagnosis: L TKR Surgical Date: 07/02/12 Next MD Visit: Dr. Sherlean Foot - 10/16/2012 Prior Therapy: For R TKR  Subjective Symptoms/Limitations Symptoms: I am going back to the doctor on 10/16/12 and I will find out when I can go back to work.  Still having greatest difficulty with her balance.  She fell last weekend.   Sensation/Coordination/Flexibility/Functional Tests Functional Tests Functional Tests: LEFS: 58/80 (was 58/80) Functional Tests: ABC: 79%  Assessment LLE Strength Left Hip Flexion: 5/5 (was 4/5) Left Hip Extension: 5/5 (was 4/5) Left Hip ABduction: 5/5 (was 4+/5) Left Knee Flexion: 5/5 (was 4+/5) Left Knee Extension: 5/5 (was 4+/5)  Mobility/Balance  Ambulation/Gait Assistive device: None Gait Pattern: Within Functional Limits (was trunk flexion ) Static Standing Balance Single Leg Stance - Right Leg:  8 (was 7) Single Leg Stance - Left Leg:  9 (was 7)   Exercise/Treatments Stretches Gastroc Stretch: 2 reps;30 seconds Standing Functional Squat: 5 reps;Limitations Functional Squat Limitations: 15lbs floor to chair w/cueing for proper posture SLS: 3x30 sec w/intermittent HHA Other Standing Knee Exercises: Standing on 4in step w/RLE 3x60 sec holds with perturbulance Other Standing Knee Exercises: stepping onto chair 4x w/UE A to improve confidence; Lateral and forward step up on 4in step to simulate work conditions x3 each Supine Short Arc Quad Sets: Left;15 reps (5 sec holds)    Physical Therapy Assessment and Plan PT Assessment and Plan Clinical Impression Statement: Ms. Derderian has attended 50 OP PT visits s/p L TKR w/following findings: she continues to be  limitied by her knee extension, she has decreased confidence secondary to 2 recent falls (one off a step ladder in her ktichen and the other out of the shower), she continues to be limited by knee extension.  She has made significant improvements in improved gait mechanics and overall LE strength.  Discussed with pt about regaining confidence with balance and had pt deomstrate functional work activities without increased difficulty. Will be discussing an order for compression hose with MD at her visit on 10/16/12 Pt will benefit from skilled therapeutic intervention in order to improve on the following deficits: Decreased activity tolerance;Decreased range of motion;Decreased balance Rehab Potential: Good PT Frequency: Min 2X/week PT Duration: 4 weeks PT Treatment/Interventions: Gait training;Stair training;Functional mobility training;Therapeutic activities;Therapeutic exercise;Balance training;Neuromuscular re-education;Patient/family education;Manual techniques;Modalities PT Plan: Continue with balance activities and incorporate functional activities to improve knee flexion (heel walking, toe walking, standing and supine TKE).  Add Dentist    Goals Home Exercise Program Pt will Perform Home Exercise Program: Independently PT Goal: Perform Home Exercise Program - Progress: Met PT Short Term Goals Time to Complete Short Term Goals: 2 weeks PT Short Term Goal 1: Pt will improve her L knee AROM 3-118  degrees. PT Short Term Goal 1 - Progress: Progressing toward goal PT Short Term Goal 2: Pt will improve LE strength by 1 muscle grade. PT Short Term Goal 2 - Progress: Met PT Short Term Goal 3: Pt will improve her LE strength in order to ambulate independently with mild gait impairments. PT Short Term Goal 3 - Progress: Met PT Short Term Goal 4: Pt will improve L and R SLS x15 sec on static surface.  PT Short Term Goal 4 - Progress:  Progressing toward goal PT Long Term Goals Time to Complete  Long Term Goals: 8 weeks PT Long Term Goal 1: Pt will improve her LE strength to WNL in order to ascend and descend 10 stairs w/1 handrail and to demonstrate safe stepping onto 10 inch surface to climb into fork lift.  PT Long Term Goal 1 - Progress: Progressing toward goal PT Long Term Goal 2: Pt will improve dynamic balance and demonstrate tandem gait on dynamic surface w/supervision. PT Long Term Goal 2 - Progress: Met Long Term Goal 3: Pt will improve her LEFS to 68/80 and ABC to 90%  Problem List Patient Active Problem List  Diagnosis  . Respiratory acidosis  . Acute respiratory failure  . Altered mental status  . Hypoxemia  . Difficulty in walking  . Knee pain  . Knee stiffness    PT - End of Session Equipment Utilized During Treatment: Gait belt Activity Tolerance: Patient tolerated treatment well General Behavior During Session: [facility] for tasks performed Cognition: [facility] for tasks performed PT Plan of Care PT Patient Instructions: Disscussed ABC findings, energy conservation with return to work, compression stocking information,  Provided with information for [employer] and [email or web address].  Consulted and Agree with Plan of Care: Patient  Annett Fabian, PT 10/04/2012, 3:05 PM  Physician Documentation Your signature is required to indicate approval of the treatment plan as stated above.  Please sign and either send electronically or make a copy of this report for your files and return this physician signed original.   Please mark one 1.__approve of plan  2. ___approve of plan with the following conditions.   ______________________________                                                          _____________________ Physician Signature                                                                                                             Date

## 2012-10-09 ENCOUNTER — Inpatient Hospital Stay (HOSPITAL_COMMUNITY): Admission: RE | Admit: 2012-10-09 | Payer: Managed Care, Other (non HMO) | Source: Ambulatory Visit

## 2012-10-11 ENCOUNTER — Ambulatory Visit (HOSPITAL_COMMUNITY): Payer: Managed Care, Other (non HMO) | Admitting: Physical Therapy

## 2012-10-12 ENCOUNTER — Ambulatory Visit (HOSPITAL_COMMUNITY)
Admission: RE | Admit: 2012-10-12 | Discharge: 2012-10-12 | Disposition: A | Discharge status: Home / Self Care | Payer: Managed Care, Other (non HMO) | Source: Ambulatory Visit | Attending: Orthopedic Surgery | Admitting: Orthopedic Surgery

## 2012-10-12 ENCOUNTER — Ambulatory Visit (HOSPITAL_COMMUNITY): Payer: Managed Care, Other (non HMO) | Admitting: Physical Therapy

## 2012-10-12 NOTE — Progress Notes (Signed)
Physical Therapy Treatment Patient Details  Name: Kimberly Love MRN: [id number] Date of Birth: 01/10/1951  Today's Date: 10/12/2012 Time: 1430-1508 PT Time Calculation (min): 38 min Charges: 56' NMR Visit#: 15 of 22  Re-eval: 11/03/12    Subjective: Symptoms/Limitations Symptoms: "I wore my steel toe shoes today."  Pt reports that she is going back to the doctor on the 18th.  Pain Assessment Currently in Pain?: Yes Pain Score:   2  Exercise/Treatments Aerobic Tread Mill: 1.3-1.4 mph w/o UE A x15 min cueing for stance time to decrease limping.  Standing Forward Step Up: Left;15 reps;Step Height: 8";Hand Hold: 0 Other Standing Knee Exercises: BOSU weight shifting F/B and S/S x8 minutes   Physical Therapy Assessment and Plan PT Assessment and Plan Clinical Impression Statement: Added activities to challenge functional balance, pt demonstrates impaired ankle and hip strategy on BOSU which improves with cueing and practice.  Disscussed important energy conservation activities with pt when returning to work.  All activities completed with her steel toe boots on. Has significant fatque at end of treatment. PT Duration: 4 weeks PT Plan: Continue with balance activities and incorporate functional activities to improve knee flexion (heel walking, toe walking, standing and supine TKE).  Add balance master    Goals    Problem List Patient Active Problem List  Diagnosis  . Respiratory acidosis  . Acute respiratory failure  . Altered mental status  . Hypoxemia  . Difficulty in walking  . Knee pain  . Knee stiffness    PT - End of Session Activity Tolerance: Patient tolerated treatment well General Behavior During Session: [facility] for tasks performed Cognition: [facility] for tasks performed  Karolyne Timmons, PT 10/12/2012, 3:19 PM

## 2012-10-16 ENCOUNTER — Ambulatory Visit (HOSPITAL_COMMUNITY): Payer: Managed Care, Other (non HMO)

## 2012-10-17 ENCOUNTER — Ambulatory Visit (HOSPITAL_COMMUNITY): Payer: Managed Care, Other (non HMO)

## 2012-10-18 ENCOUNTER — Inpatient Hospital Stay (HOSPITAL_COMMUNITY): Admission: RE | Admit: 2012-10-18 | Payer: Managed Care, Other (non HMO) | Source: Ambulatory Visit

## 2012-10-23 ENCOUNTER — Ambulatory Visit (HOSPITAL_COMMUNITY): Payer: Managed Care, Other (non HMO)

## 2012-10-24 ENCOUNTER — Ambulatory Visit (HOSPITAL_COMMUNITY)
Admission: RE | Admit: 2012-10-24 | Discharge: 2012-10-24 | Disposition: A | Discharge status: Home / Self Care | Payer: Managed Care, Other (non HMO) | Source: Ambulatory Visit

## 2012-10-24 NOTE — Progress Notes (Signed)
Physical Therapy Treatment Patient Details  Name: Kimberly Love MRN: [id number] Date of Birth: 09/17/1950  Today's Date: 10/24/2012 Time: 1525-1600 PT Time Calculation (min): 35 min  Visit#: 16 of 22  Re-eval: 11/03/12  Charge: Gait 10', therex 25'  Subjective: Symptoms/Limitations Symptoms: Pt late for apt today, stated L knee feeling good, no pain. Pain Assessment Currently in Pain?: No/denies  Objective :  Exercise/Treatments Aerobic Tread Mill: 1.5--1.9 mph x 10 min Standing Lateral Step Up: Left;15 reps;Hand Hold: 0;Step Height: 6" Forward Step Up: Left;15 reps;Hand Hold: 0;Step Height: 6" Other Standing Knee Exercises: heel and toe walking 2RT, tandem and retro gait 2RT Other Standing Knee Exercises: balance master test static and dynamic     Physical Therapy Assessment and Plan PT Assessment and Plan Clinical Impression Statement: Continued with balance and functional activities to improve ROM and strength for RTW.  Began balance master test static and dynamic, pt unable to complete dynamic test due to c/o pain with her steel toe shoes. PT Plan: Continue with balance activities and incorporate functional activities to improve knee flexion (heel walking, toe walking, standing and supine TKE).  Complete dynamic balance master test next session.    Goals    Problem List Patient Active Problem List  Diagnosis  . Respiratory acidosis  . Acute respiratory failure  . Altered mental status  . Hypoxemia  . Difficulty in walking  . Knee pain  . Knee stiffness    PT - End of Session Equipment Utilized During Treatment: Gait belt Activity Tolerance: Patient tolerated treatment well General Behavior During Session: [facility] for tasks performed Cognition: [facility] for tasks performed  GP    [name] 10/24/2012, 5:12 PM

## 2012-10-30 ENCOUNTER — Ambulatory Visit (HOSPITAL_COMMUNITY): Payer: Managed Care, Other (non HMO) | Admitting: Physical Therapy

## 2012-11-01 ENCOUNTER — Inpatient Hospital Stay (HOSPITAL_COMMUNITY): Admission: RE | Admit: 2012-11-01 | Payer: Managed Care, Other (non HMO) | Source: Ambulatory Visit

## 2012-11-06 ENCOUNTER — Ambulatory Visit (HOSPITAL_COMMUNITY): Payer: Managed Care, Other (non HMO)

## 2012-11-07 ENCOUNTER — Ambulatory Visit (HOSPITAL_COMMUNITY)
Admission: RE | Admit: 2012-11-07 | Discharge: 2012-11-07 | Disposition: A | Discharge status: Home / Self Care | Payer: Managed Care, Other (non HMO) | Source: Ambulatory Visit | Attending: Orthopedic Surgery | Admitting: Orthopedic Surgery

## 2012-11-07 DIAGNOSIS — M6281 Muscle weakness (generalized): Secondary | ICD-10-CM | POA: Insufficient documentation

## 2012-11-07 DIAGNOSIS — M25669 Stiffness of unspecified knee, not elsewhere classified: Secondary | ICD-10-CM | POA: Insufficient documentation

## 2012-11-07 DIAGNOSIS — R262 Difficulty in walking, not elsewhere classified: Secondary | ICD-10-CM | POA: Insufficient documentation

## 2012-11-07 DIAGNOSIS — IMO0001 Reserved for inherently not codable concepts without codable children: Secondary | ICD-10-CM | POA: Insufficient documentation

## 2012-11-07 DIAGNOSIS — M25569 Pain in unspecified knee: Secondary | ICD-10-CM | POA: Insufficient documentation

## 2012-11-07 NOTE — Progress Notes (Signed)
Physical Therapy Treatment Patient Details  Name: Kimberly Love MRN: [id number] Date of Birth: 10-30-1950  Today's Date: 11/07/2012 Time: 7829-5621 PT Time Calculation (min): 44 min Visit#: 17 of 22  Re-eval: 11/12/12 Charges:  therex 25', gait 15'  Subjective: Symptoms/Limitations Symptoms: Painfree; Has not been to therapy lately due to weather conditions.  States she has not fallen X 2 months. Pain Assessment Currently in Pain?: No/denies   Exercise/Treatments Aerobic Tread Mill: 1.5--1.9 mph x 15 min Standing Stairs: reciprocal pattern 2Flight/ 1 HR Other Standing Knee Exercises: heel and toe walking 2RT, tandem and retro gait 2RT    Physical Therapy Assessment and Plan PT Assessment and Plan Clinical Impression Statement: Pt. able to complete balance activities without LOB.  negotiated 2 flights of stairs reciprocally with 1 HR without pain or hesitation.  Pt. without c/o pain during or after session. Attempted side stepping with theraband, however not a challenge for pt. to complete.  Pt. returns to MD 3/18 and will need a re-eval next visit. PT Plan: Continue with balance activities.  Re-evaluate next session.     Problem List Patient Active Problem List  Diagnosis  . Respiratory acidosis  . Acute respiratory failure  . Altered mental status  . Hypoxemia  . Difficulty in walking  . Knee pain  . Knee stiffness    PT - End of Session Equipment Utilized During Treatment: Gait belt Activity Tolerance: Patient tolerated treatment well General Behavior During Session: [facility] for tasks performed Cognition: [facility] for tasks performed   [name], PTA/CLT 11/07/2012, 6:02 PM

## 2012-11-12 ENCOUNTER — Ambulatory Visit (HOSPITAL_COMMUNITY): Payer: Managed Care, Other (non HMO) | Admitting: Physical Therapy

## 2015-03-19 ENCOUNTER — Other Ambulatory Visit (HOSPITAL_COMMUNITY): Payer: Self-pay | Admitting: Internal Medicine

## 2015-03-19 DIAGNOSIS — R921 Mammographic calcification found on diagnostic imaging of breast: Secondary | ICD-10-CM

## 2015-03-31 ENCOUNTER — Ambulatory Visit (HOSPITAL_COMMUNITY)
Admission: RE | Admit: 2015-03-31 | Discharge: 2015-03-31 | Disposition: A | Discharge status: Home / Self Care | Payer: 59 | Source: Ambulatory Visit | Attending: Internal Medicine | Admitting: Internal Medicine

## 2015-03-31 DIAGNOSIS — R921 Mammographic calcification found on diagnostic imaging of breast: Secondary | ICD-10-CM | POA: Diagnosis present

## 2016-01-04 ENCOUNTER — Encounter (INDEPENDENT_AMBULATORY_CARE_PROVIDER_SITE_OTHER): Payer: Self-pay | Admitting: Internal Medicine

## 2016-01-05 ENCOUNTER — Encounter (INDEPENDENT_AMBULATORY_CARE_PROVIDER_SITE_OTHER): Payer: Self-pay | Admitting: *Deleted

## 2016-01-05 ENCOUNTER — Other Ambulatory Visit (INDEPENDENT_AMBULATORY_CARE_PROVIDER_SITE_OTHER): Payer: Self-pay | Admitting: Internal Medicine

## 2016-01-05 ENCOUNTER — Encounter (INDEPENDENT_AMBULATORY_CARE_PROVIDER_SITE_OTHER): Payer: Self-pay | Admitting: Internal Medicine

## 2016-01-05 ENCOUNTER — Ambulatory Visit (INDEPENDENT_AMBULATORY_CARE_PROVIDER_SITE_OTHER): Payer: 59 | Admitting: Internal Medicine

## 2016-01-05 VITALS — BP 112/70 | HR 64 | Temp 98.0°F | Ht 66.2 in | Wt 267.0 lb

## 2016-01-05 DIAGNOSIS — K219 Gastro-esophageal reflux disease without esophagitis: Secondary | ICD-10-CM

## 2016-01-05 NOTE — Patient Instructions (Signed)
The risks and benefits such as perforation, bleeding, and infection were reviewed with the patient and is agreeable. 

## 2016-01-05 NOTE — Progress Notes (Signed)
   Subjective:    Patient ID: Kimberly Love, female    DOB: May 28, 1951, 65 y.o.   MRN: [id number]   Patient smokes about 1 1/2 packs a day. Hx of respiratory arrest in 2013 after she received pain medications for a total knee.  HPI Referred by Inetta Fermo for GERD/EGD. She says she has been having acid reflux x 3 months. She says she was lying in bed and took a heavy deep breath. She felt something "pop"in her sternal area, and then she had to get up. After that, she started to have acid reflux. She says her acid reflux is better since starting the Protonix. She still has breakthru heartburn She has been taking Protonix x 2 in the am.  Appetite is good. No weight loss. No dysphagia. She usually has a BM x 1 a day. No melena or BRRB. No NSAIDs.  She has never undergone a colonoscopy.     Review of Systems Past Medical History  Diagnosis Date  . Anxiety   . Depression   . Hypertension     dr Edwyna Ready   [facility]  [place]  . Chronic kidney disease   . Seizures ([place])     x1- 1973- post childbirth   . Arthritis     OA- L knee   . Complication of anesthesia     had seizure in 1973  while waking up, after childbirth - vaginal  birth   . PONV (postoperative nausea and vomiting)     Past Surgical History  Procedure Laterality Date  . Tonsillectomy    . Cholecystectomy    . Breast surgery      bio  2012   neg  . Orthorscopy      rt knee  . Total knee arthroplasty  12/26/2011    Procedure: TOTAL KNEE ARTHROPLASTY;  Surgeon: Rudean Haskell, MD;  Location: [place];  Service: Orthopedics;  Laterality: Right;  right total knee arthroplasty  . Abdominal hysterectomy      partial   . Tubal ligation    . Total knee arthroplasty  07/02/2012    left knee  . Total knee arthroplasty  07/02/2012    Procedure: TOTAL KNEE ARTHROPLASTY;  Surgeon: Rudean Haskell, MD;  Location: [place];  Service: Orthopedics;  Laterality: Left;  left total knee replacement    Allergies  Allergen Reactions  .  Oxycodone     Patient ended up in icu    Current Outpatient Prescriptions on File Prior to Visit  Medication Sig Dispense Refill  . lisinopril-hydrochlorothiazide (PRINZIDE,ZESTORETIC) 20-25 MG per tablet Take 1 tablet by mouth daily before breakfast.      No current facility-administered medications on file prior to visit.        Objective:   Physical ExamBlood pressure 112/70, pulse 64, temperature 98 F (36.7 C), height 5' 6.2" (1.681 m), weight 267 lb (121.11 kg).  Alert and oriented. Skin warm and dry. Oral mucosa is moist.   . Sclera anicteric, conjunctivae is pink. Thyroid not enlarged. No cervical lymphadenopathy. Lungs clear. Heart regular rate and rhythm.  Abdomen is soft. Bowel sounds are positive. No hepatomegaly. No abdominal masses felt. No tenderness.  No edema to lower extremities.         Assessment & Plan:  EGD. PUD needs to be ruled out. GERD diet given to patient. The risks and benefits such as perforation, bleeding, and infection were reviewed with the patient and is agreeable. Declined colonoscopy today.

## 2016-02-05 ENCOUNTER — Ambulatory Visit (HOSPITAL_COMMUNITY)
Admission: RE | Admit: 2016-02-05 | Discharge: 2016-02-05 | Disposition: A | Discharge status: Home / Self Care | Payer: 59 | Source: Ambulatory Visit | Attending: Internal Medicine | Admitting: Internal Medicine

## 2016-02-05 ENCOUNTER — Encounter (HOSPITAL_COMMUNITY): Payer: Self-pay | Admitting: *Deleted

## 2016-02-05 ENCOUNTER — Encounter (HOSPITAL_COMMUNITY)
Admission: RE | Disposition: A | Discharge status: Home / Self Care | Payer: Self-pay | Source: Ambulatory Visit | Attending: Internal Medicine

## 2016-02-05 DIAGNOSIS — K228 Other specified diseases of esophagus: Secondary | ICD-10-CM

## 2016-02-05 DIAGNOSIS — Z79899 Other long term (current) drug therapy: Secondary | ICD-10-CM | POA: Diagnosis not present

## 2016-02-05 DIAGNOSIS — F419 Anxiety disorder, unspecified: Secondary | ICD-10-CM | POA: Insufficient documentation

## 2016-02-05 DIAGNOSIS — R12 Heartburn: Secondary | ICD-10-CM | POA: Diagnosis present

## 2016-02-05 DIAGNOSIS — I129 Hypertensive chronic kidney disease with stage 1 through stage 4 chronic kidney disease, or unspecified chronic kidney disease: Secondary | ICD-10-CM | POA: Insufficient documentation

## 2016-02-05 DIAGNOSIS — K449 Diaphragmatic hernia without obstruction or gangrene: Secondary | ICD-10-CM | POA: Diagnosis not present

## 2016-02-05 DIAGNOSIS — F17210 Nicotine dependence, cigarettes, uncomplicated: Secondary | ICD-10-CM | POA: Diagnosis not present

## 2016-02-05 DIAGNOSIS — N189 Chronic kidney disease, unspecified: Secondary | ICD-10-CM | POA: Diagnosis not present

## 2016-02-05 DIAGNOSIS — F329 Major depressive disorder, single episode, unspecified: Secondary | ICD-10-CM | POA: Insufficient documentation

## 2016-02-05 DIAGNOSIS — K219 Gastro-esophageal reflux disease without esophagitis: Secondary | ICD-10-CM

## 2016-02-05 DIAGNOSIS — Z96652 Presence of left artificial knee joint: Secondary | ICD-10-CM | POA: Insufficient documentation

## 2016-02-05 DIAGNOSIS — M170 Bilateral primary osteoarthritis of knee: Secondary | ICD-10-CM | POA: Insufficient documentation

## 2016-02-05 HISTORY — PX: ESOPHAGOGASTRODUODENOSCOPY: SHX5428

## 2016-02-05 SURGERY — EGD (ESOPHAGOGASTRODUODENOSCOPY)
Anesthesia: Moderate Sedation

## 2016-02-05 MED ORDER — MEPERIDINE HCL 50 MG/ML IJ SOLN
INTRAMUSCULAR | Status: AC
  Filled 2016-02-05: qty 1

## 2016-02-05 MED ORDER — BUTAMBEN-TETRACAINE-BENZOCAINE 2-2-14 % EX AERO
INHALATION_SPRAY | CUTANEOUS | Status: AC
  Filled 2016-02-05: qty 20

## 2016-02-05 MED ORDER — MEPERIDINE HCL 50 MG/ML IJ SOLN
INTRAMUSCULAR | Status: DC | PRN
  Administered 2016-02-05 (×2): 25 mg via INTRAVENOUS

## 2016-02-05 MED ORDER — MIDAZOLAM HCL 5 MG/5ML IJ SOLN
INTRAMUSCULAR | Status: DC | PRN
  Administered 2016-02-05 (×3): 2 mg via INTRAVENOUS

## 2016-02-05 MED ORDER — SODIUM CHLORIDE 0.9 % IV SOLN
INTRAVENOUS | Status: DC
  Administered 2016-02-05: 10:00:00 via INTRAVENOUS

## 2016-02-05 MED ORDER — MIDAZOLAM HCL 5 MG/5ML IJ SOLN
INTRAMUSCULAR | Status: AC
  Filled 2016-02-05: qty 10

## 2016-02-05 MED ORDER — SIMETHICONE 40 MG/0.6ML PO SUSP
ORAL | Status: DC | PRN
  Administered 2016-02-05: 10:00:00

## 2016-02-05 NOTE — Discharge Instructions (Signed)
Resume usual medications and diet. You need to make another effort to quit cigarette smoking and may also want to review treatment options with Dr. Delphina Cahill. No driving for 24 hours. Physician will call with biopsy results.   Esophagogastroduodenoscopy Esophagogastroduodenoscopy (EGD) is a procedure that is used to examine the lining of the esophagus, stomach, and first part of the small intestine (duodenum). A long, flexible, lighted tube with a camera attached (endoscope) is inserted down the throat to view these organs. This procedure is done to detect problems or abnormalities, such as inflammation, bleeding, ulcers, or growths, in order to treat them. The procedure lasts 5-20 minutes. It is usually an outpatient procedure, but it may need to be performed in a hospital in emergency cases. LET [facility] CARE PROVIDER KNOW ABOUT:  Any allergies you have.  All medicines you are taking, including vitamins, herbs, eye drops, creams, and over-the-counter medicines.  Previous problems you or members of your family have had with the use of anesthetics.  Any blood disorders you have.  Previous surgeries you have had.  Medical conditions you have. RISKS AND COMPLICATIONS Generally, this is a safe procedure. However, problems can occur and include:  Infection.  Bleeding.  Tearing (perforation) of the esophagus, stomach, or duodenum.  Difficulty breathing or not being able to breathe.  Excessive sweating.  Spasms of the larynx.  Slowed heartbeat.  Low blood pressure. BEFORE THE PROCEDURE  Do not eat or drink anything after midnight on the night before the procedure or as directed by your health care provider.  Do not take your regular medicines before the procedure if your health care provider asks you not to. Ask your health care provider about changing or stopping those medicines.  If you wear dentures, be prepared to remove them before the procedure.  Arrange for someone  to drive you home after the procedure. PROCEDURE  A numbing medicine (local anesthetic) may be sprayed in your throat for comfort and to stop you from gagging or coughing.  You will have an IV tube inserted in a vein in your hand or arm. You will receive medicines and fluids through this tube.  You will be given a medicine to relax you (sedative).  A pain reliever will be given through the IV tube.  A mouth guard may be placed in your mouth to protect your teeth and to keep you from biting on the endoscope.  You will be asked to lie on your left side.  The endoscope will be inserted down your throat and into your esophagus, stomach, and duodenum.  Air will be put through the endoscope to allow your health care provider to clearly view the lining of your esophagus.  The lining of your esophagus, stomach, and duodenum will be examined. During the exam, your health care provider may:  Remove tissue to be examined under a microscope (biopsy) for inflammation, infection, or other medical problems.  Remove growths.  Remove objects (foreign bodies) that are stuck.  Treat any bleeding with medicines or other devices that stop tissues from bleeding (hot cautery, clipping devices).  Widen (dilate) or stretch narrowed areas of your esophagus and stomach.  The endoscope will be withdrawn. AFTER THE PROCEDURE  You will be taken to a recovery area for observation. Your blood pressure, heart rate, breathing rate, and blood oxygen level will be monitored often until the medicines you were given have worn off.  Do not eat or drink anything until the numbing medicine has worn off  and your gag reflex has returned. You may choke.  Your health care provider should be able to discuss his or her findings with you. It will take longer to discuss the test results if any biopsies were taken.   This information is not intended to replace advice given to you by your health care provider. Make sure you  discuss any questions you have with your health care provider.   Document Released: 12/16/2004 Document Revised: 09/05/2014 Document Reviewed: 07/18/2012 Elsevier Interactive Patient Education [employer].

## 2016-02-05 NOTE — Op Note (Signed)
[facility] Patient Name: Kimberly Love Procedure Date: 02/05/2016 9:54 AM MRN: [id number] Date of Birth: [birth year] Attending MD: Hildred Laser , MD CSN: [id number] Age: 65 Admit Type: Outpatient Procedure:                Upper GI endoscopy Indications:              Suspected gastro-esophageal reflux disease Providers:                Hildred Laser, MD, Rosina Lowenstein, RN, Claudius Sis,                            Technologist Referring MD:             Delphina Cahill, MD Medicines:                Cetacaine spray, Meperidine 50 mg IV, Midazolam 6                            mg IV Complications:            No immediate complications. Estimated Blood Loss:     Estimated blood loss was minimal. Procedure:                Pre-Anesthesia Assessment:                           - Prior to the procedure, a History and Physical                            was performed, and patient medications and                            allergies were reviewed. The patient's tolerance of                            previous anesthesia was also reviewed. The risks                            and benefits of the procedure and the sedation                            options and risks were discussed with the patient.                            All questions were answered, and informed consent                            was obtained. Prior Anticoagulants: The patient has                            taken no previous anticoagulant or antiplatelet                            agents. ASA Grade Assessment: III - A patient with  severe systemic disease. After reviewing the risks                            and benefits, the patient was deemed in                            satisfactory condition to undergo the procedure.                           After obtaining informed consent, the endoscope was                            passed under direct vision. Throughout the   procedure, the patient's blood pressure, pulse, and                            oxygen saturations were monitored continuously. The                            [phone]) was introduced through the mouth,                            and advanced to the second part of duodenum. The                            upper GI endoscopy was accomplished without                            difficulty. The patient tolerated the procedure                            well. Scope In: 10:31:18 AM Scope Out: 10:40:02 AM Total Procedure Duration: 0 hours 8 minutes 44 seconds  Findings:      Diffuse mild mucosal changes characterized by granularity and nodularity       were found in the middle third of the esophagus and in the lower third       of the esophagus. Biopsies were taken with a cold forceps for histology.      The Z-line was irregular and was found 43 cm from the incisors.      A 2 cm hiatal hernia was present.      The entire examined stomach was normal.      The duodenal bulb and second portion of the duodenum were normal. Impression:               - Granular, nodular mucosa in the esophagus.                            Biopsied.                           - Z-line irregular, 43 cm from the incisors.                           - 2 cm hiatal hernia.                           -  Normal stomach.                           - Normal duodenal bulb and second portion of the                            duodenum.                           comment: No evidence of erosive esophagitis or                            Barrett's esophagus. Moderate Sedation:      Moderate (conscious) sedation was administered by the endoscopy nurse       and supervised by the endoscopist. The following parameters were       monitored: oxygen saturation, heart rate, blood pressure, CO2       capnography and response to care. Total physician intraservice time was       16 minutes. Recommendation:           - Resume previous diet  today.                           - Continue present medications.                           - Await pathology results.                           - Patient counseled to quit cigarette smoking.                           - She should review options with Dr. Nevada Crane as she is                            not able to do it on her own. Procedure Code(s):        --- Professional ---                           [phone], Esophagogastroduodenoscopy, flexible,                            transoral; with biopsy, single or multiple                           99152, Moderate sedation services provided by the                            same physician or other qualified health care                            professional performing the diagnostic or                            therapeutic service that the sedation supports,  requiring the presence of an independent trained                            observer to assist in the monitoring of the                            patient's level of consciousness and physiological                            status; initial 15 minutes of intraservice time,                            patient age 71 years or older Diagnosis Code(s):        --- Professional ---                           K22.8, Other specified diseases of esophagus                           K44.9, Diaphragmatic hernia without obstruction or                            gangrene CPT copyright 2016 American Medical Association. All rights reserved. The codes documented in this report are preliminary and upon coder review may  be revised to meet current compliance requirements. Hildred Laser, MD Hildred Laser, MD 02/05/2016 10:55:43 AM This report has been signed electronically. Number of Addenda: 0

## 2016-02-05 NOTE — H&P (Signed)
Kimberly Love is an 65 y.o. Love.   Chief Complaint: Patient is here for EGD. HPI: Patient is Kimberly Love who presents with three-month history of heartburn. Kimberly Love can't have chest pain as well as heartburn. Kimberly Love change her eating habits. Kimberly Love was started on PPI and felt better. Dose had to be doubled before Kimberly Love got significant relief. Kimberly Love is 80% better. Kimberly Love was having some dysphagia but not anymore. Kimberly Love denies melena abdominal pain. Kimberly Love has lost 10 pounds in the last 3 months. Kimberly Love believes it is because Kimberly Love is not able to eat the foods that Kimberly Love likes and Kimberly Love also has cut back on carbonated drinks and coffee. Kimberly Love smokes pack and a half of cigarettes per day.  Past Medical History  Diagnosis Date  . Anxiety   . Depression   . Hypertension     dr Edwyna Ready   hall[facility]  [place]  . Chronic kidney disease   . Seizures ([place])     x1- 1973- post childbirth   . Arthritis     OA- L knee   . Complication of anesthesia     had seizure in 1973  while waking up, after childbirth - vaginal  birth   . PONV (postoperative nausea and vomiting)     Past Surgical History  Procedure Laterality Date  . Tonsillectomy    . Cholecystectomy    . Breast surgery      bio  2012   neg  . Orthorscopy      rt knee  . Total knee arthroplasty  12/26/2011    Procedure: TOTAL KNEE ARTHROPLASTY;  Surgeon: Rudean Haskell, MD;  Location: [place];  Service: Orthopedics;  Laterality: Right;  right total knee arthroplasty  . Abdominal hysterectomy      partial   . Tubal ligation    . Total knee arthroplasty  07/02/2012    left knee  . Total knee arthroplasty  07/02/2012    Procedure: TOTAL KNEE ARTHROPLASTY;  Surgeon: Rudean Haskell, MD;  Location: [place];  Service: Orthopedics;  Laterality: Left;  left total knee replacement    History reviewed. No pertinent family history. Social History:  reports that Kimberly Love has been smoking Cigarettes.  Kimberly Love has a 32 pack-year smoking history. Kimberly Love has never used smokeless  tobacco. Kimberly Love reports that Kimberly Love does not drink alcohol or use illicit drugs.  Allergies:  Allergies  Allergen Reactions  . Oxycodone     Patient ended up in icu    Medications Prior to Admission  Medication Sig Dispense Refill  . desvenlafaxine (PRISTIQ) 50 MG 24 hr tablet Take 50 mg by mouth daily.    Marland Kitchen lisinopril-hydrochlorothiazide (PRINZIDE,ZESTORETIC) 20-25 MG per tablet Take 1 tablet by mouth daily before breakfast.     . pantoprazole (PROTONIX) 40 MG tablet Take 40 mg by mouth 2 (two) times daily before a meal.       No results found for this or any previous visit (from the past 48 hour(s)). No results found.  ROS  Blood pressure 157/70, pulse 95, temperature 98.1 F (36.7 C), temperature source Oral, resp. rate 20, height 5' 6.5" (1.689 m), weight 262 lb (118.842 kg), SpO2 95 %. Physical Exam  Constitutional:  Well-developed obese occasion Love in NAD.  HENT:  Mouth/Throat: Oropharynx is clear and moist.  Eyes: Conjunctivae are normal. No scleral icterus.  Neck: No thyromegaly present.  Cardiovascular: Normal rate, regular rhythm and normal heart sounds.   No murmur heard. Respiratory: Effort normal and  breath sounds normal.  GI: Soft. Kimberly Love exhibits no distension and no mass. There is no tenderness.  Musculoskeletal: Kimberly Love exhibits no edema.  Lymphadenopathy:    Kimberly Love has no cervical adenopathy.  Neurological: Kimberly Love is alert.  Skin: Skin is warm and dry.     Assessment/Plan GERD of recent onset in a patient with 65 years old. Diagnostic EGD.  Hildred Laser, MD 02/05/2016, 10:20 AM

## 2016-02-09 ENCOUNTER — Telehealth (INDEPENDENT_AMBULATORY_CARE_PROVIDER_SITE_OTHER): Payer: Self-pay | Admitting: Internal Medicine

## 2016-02-09 NOTE — Telephone Encounter (Signed)
Patient called, she'd like to know the results of her biopsy.  [phone]

## 2016-02-09 NOTE — Telephone Encounter (Signed)
Dr.Rehman is going to call the patient.

## 2016-02-10 ENCOUNTER — Encounter (HOSPITAL_COMMUNITY): Payer: Self-pay | Admitting: Internal Medicine

## 2017-03-23 ENCOUNTER — Encounter (INDEPENDENT_AMBULATORY_CARE_PROVIDER_SITE_OTHER): Payer: Self-pay | Admitting: *Deleted

## 2017-07-14 ENCOUNTER — Encounter (INDEPENDENT_AMBULATORY_CARE_PROVIDER_SITE_OTHER): Payer: Self-pay | Admitting: *Deleted

## 2017-07-17 ENCOUNTER — Other Ambulatory Visit (INDEPENDENT_AMBULATORY_CARE_PROVIDER_SITE_OTHER): Payer: Self-pay | Admitting: *Deleted

## 2017-07-17 DIAGNOSIS — Z1211 Encounter for screening for malignant neoplasm of colon: Secondary | ICD-10-CM | POA: Insufficient documentation

## 2017-09-06 ENCOUNTER — Encounter (INDEPENDENT_AMBULATORY_CARE_PROVIDER_SITE_OTHER): Payer: Self-pay | Admitting: *Deleted

## 2017-09-06 ENCOUNTER — Telehealth (INDEPENDENT_AMBULATORY_CARE_PROVIDER_SITE_OTHER): Payer: Self-pay | Admitting: *Deleted

## 2017-09-06 MED ORDER — PEG-KCL-NACL-NASULF-NA ASC-C 140 G PO SOLR
1.0000 | Freq: Once | ORAL | 0 refills | Status: AC
Start: 2017-09-06 — End: 2017-09-06

## 2017-09-06 NOTE — Telephone Encounter (Signed)
Patient needs plenvu 

## 2017-09-19 ENCOUNTER — Other Ambulatory Visit (HOSPITAL_COMMUNITY): Payer: Self-pay | Admitting: Internal Medicine

## 2017-09-19 DIAGNOSIS — Z1231 Encounter for screening mammogram for malignant neoplasm of breast: Secondary | ICD-10-CM

## 2017-09-22 ENCOUNTER — Telehealth (INDEPENDENT_AMBULATORY_CARE_PROVIDER_SITE_OTHER): Payer: Self-pay | Admitting: *Deleted

## 2017-09-22 NOTE — Telephone Encounter (Signed)
Referring MD/PCP: hall   Procedure: tcs  Reason/Indication:  screening  Has patient had this procedure before?  no  If so, when, by whom and where?    Is there a family history of colon cancer?  no  Who?  What age when diagnosed?    Is patient diabetic?   no      Does patient have prosthetic heart valve or mechanical valve?  no  Do you have a pacemaker?  no  Has patient ever had endocarditis? no  Has patient had joint replacement within last 12 months?  no  Is patient constipated or take laxatives? no  Does patient have a history of alcohol/drug use?  no  Is patient on Coumadin, Plavix and/or Aspirin? no  Medications: see epic  Allergies: any med w/ codeine  Medication Adjustment per Dr Laural Golden:   Procedure date & time: 10/18/17 at 930

## 2017-09-25 NOTE — Telephone Encounter (Signed)
agree

## 2017-10-11 ENCOUNTER — Encounter (INDEPENDENT_AMBULATORY_CARE_PROVIDER_SITE_OTHER): Payer: Self-pay | Admitting: *Deleted

## 2017-10-23 ENCOUNTER — Telehealth (INDEPENDENT_AMBULATORY_CARE_PROVIDER_SITE_OTHER): Payer: Self-pay | Admitting: *Deleted

## 2017-10-23 NOTE — Telephone Encounter (Signed)
Per patient needs to cancel TCS for 10/27/17, her husband has the flu and she is feeling yucky, she will call me back later in the year to get rescheduled

## 2017-10-27 ENCOUNTER — Encounter (HOSPITAL_COMMUNITY): Payer: Self-pay

## 2017-10-27 ENCOUNTER — Ambulatory Visit (HOSPITAL_COMMUNITY): Admit: 2017-10-27 | Payer: 59 | Admitting: Internal Medicine

## 2017-10-27 SURGERY — COLONOSCOPY
Anesthesia: Moderate Sedation

## 2017-10-30 ENCOUNTER — Ambulatory Visit (HOSPITAL_COMMUNITY): Payer: 59

## 2017-11-08 ENCOUNTER — Ambulatory Visit (HOSPITAL_COMMUNITY): Payer: 59

## 2017-11-17 ENCOUNTER — Ambulatory Visit (HOSPITAL_COMMUNITY): Payer: 59

## 2018-03-23 ENCOUNTER — Other Ambulatory Visit (INDEPENDENT_AMBULATORY_CARE_PROVIDER_SITE_OTHER): Payer: Self-pay | Admitting: *Deleted

## 2018-03-23 DIAGNOSIS — Z1211 Encounter for screening for malignant neoplasm of colon: Secondary | ICD-10-CM

## 2018-05-03 ENCOUNTER — Telehealth (INDEPENDENT_AMBULATORY_CARE_PROVIDER_SITE_OTHER): Payer: Self-pay | Admitting: *Deleted

## 2018-05-03 ENCOUNTER — Encounter (INDEPENDENT_AMBULATORY_CARE_PROVIDER_SITE_OTHER): Payer: Self-pay | Admitting: *Deleted

## 2018-05-03 NOTE — Telephone Encounter (Signed)
Patient needs suprep 

## 2018-05-04 MED ORDER — SUPREP BOWEL PREP KIT 17.5-3.13-1.6 GM/177ML PO SOLN: 1.0000 | Freq: Once | ORAL | 0 refills | Status: AC

## 2018-05-17 ENCOUNTER — Telehealth (INDEPENDENT_AMBULATORY_CARE_PROVIDER_SITE_OTHER): Payer: Self-pay | Admitting: *Deleted

## 2018-05-17 NOTE — Telephone Encounter (Signed)
agree

## 2018-05-17 NOTE — Telephone Encounter (Signed)
Referring MD/PCP: hall   Procedure: tcs  Reason/Indication:  screening  Has patient had this procedure before?  no             If so, when, by whom and where?    Is there a family history of colon cancer?  no             Who?  What age when diagnosed?    Is patient diabetic?   no                                                  Does patient have prosthetic heart valve or mechanical valve?  no  Do you have a pacemaker?  no  Has patient ever had endocarditis? no  Has patient had joint replacement within last 12 months?  no  Is patient constipated or take laxatives? no  Does patient have a history of alcohol/drug use?  no  Is patient on Coumadin, Plavix and/or Aspirin? no  Medications: see epic  Allergies: any med w/ codeine  Medication Adjustment per Dr Laural Golden:   Procedure date & time: 05/31/18 at 830

## 2018-05-31 ENCOUNTER — Encounter (HOSPITAL_COMMUNITY): Payer: Self-pay | Admitting: *Deleted

## 2018-05-31 ENCOUNTER — Other Ambulatory Visit: Payer: Self-pay

## 2018-05-31 ENCOUNTER — Ambulatory Visit (HOSPITAL_COMMUNITY)
Admission: RE | Admit: 2018-05-31 | Discharge: 2018-05-31 | Disposition: A | Discharge status: Home / Self Care | Payer: 59 | Source: Ambulatory Visit | Attending: Internal Medicine | Admitting: Internal Medicine

## 2018-05-31 ENCOUNTER — Encounter (HOSPITAL_COMMUNITY)
Admission: RE | Disposition: A | Discharge status: Home / Self Care | Payer: Self-pay | Source: Ambulatory Visit | Attending: Internal Medicine

## 2018-05-31 DIAGNOSIS — Z96653 Presence of artificial knee joint, bilateral: Secondary | ICD-10-CM | POA: Insufficient documentation

## 2018-05-31 DIAGNOSIS — D120 Benign neoplasm of cecum: Secondary | ICD-10-CM | POA: Insufficient documentation

## 2018-05-31 DIAGNOSIS — N189 Chronic kidney disease, unspecified: Secondary | ICD-10-CM | POA: Diagnosis not present

## 2018-05-31 DIAGNOSIS — F329 Major depressive disorder, single episode, unspecified: Secondary | ICD-10-CM | POA: Diagnosis not present

## 2018-05-31 DIAGNOSIS — F17210 Nicotine dependence, cigarettes, uncomplicated: Secondary | ICD-10-CM | POA: Insufficient documentation

## 2018-05-31 DIAGNOSIS — D125 Benign neoplasm of sigmoid colon: Secondary | ICD-10-CM | POA: Insufficient documentation

## 2018-05-31 DIAGNOSIS — Z1211 Encounter for screening for malignant neoplasm of colon: Secondary | ICD-10-CM

## 2018-05-31 DIAGNOSIS — K621 Rectal polyp: Secondary | ICD-10-CM

## 2018-05-31 DIAGNOSIS — D123 Benign neoplasm of transverse colon: Secondary | ICD-10-CM | POA: Diagnosis not present

## 2018-05-31 DIAGNOSIS — K5730 Diverticulosis of large intestine without perforation or abscess without bleeding: Secondary | ICD-10-CM | POA: Diagnosis not present

## 2018-05-31 DIAGNOSIS — K219 Gastro-esophageal reflux disease without esophagitis: Secondary | ICD-10-CM | POA: Insufficient documentation

## 2018-05-31 DIAGNOSIS — I129 Hypertensive chronic kidney disease with stage 1 through stage 4 chronic kidney disease, or unspecified chronic kidney disease: Secondary | ICD-10-CM | POA: Diagnosis not present

## 2018-05-31 HISTORY — PX: POLYPECTOMY: SHX5525

## 2018-05-31 HISTORY — PX: COLONOSCOPY: SHX5424

## 2018-05-31 HISTORY — DX: Diaphragmatic hernia without obstruction or gangrene: K44.9

## 2018-05-31 HISTORY — DX: Gastro-esophageal reflux disease without esophagitis: K21.9

## 2018-05-31 SURGERY — COLONOSCOPY
Anesthesia: Moderate Sedation

## 2018-05-31 MED ORDER — MEPERIDINE HCL 50 MG/ML IJ SOLN
INTRAMUSCULAR | Status: DC | PRN
  Administered 2018-05-31 (×2): 25 mg via INTRAVENOUS

## 2018-05-31 MED ORDER — STERILE WATER FOR IRRIGATION IR SOLN
Status: DC | PRN
  Administered 2018-05-31: 08:00:00

## 2018-05-31 MED ORDER — MIDAZOLAM HCL 5 MG/5ML IJ SOLN
INTRAMUSCULAR | Status: DC | PRN
  Administered 2018-05-31: 1 mg via INTRAVENOUS
  Administered 2018-05-31 (×2): 2 mg via INTRAVENOUS
  Administered 2018-05-31: 1 mg via INTRAVENOUS

## 2018-05-31 MED ORDER — MIDAZOLAM HCL 5 MG/5ML IJ SOLN
INTRAMUSCULAR | Status: AC
  Filled 2018-05-31: qty 10

## 2018-05-31 MED ORDER — MEPERIDINE HCL 50 MG/ML IJ SOLN
INTRAMUSCULAR | Status: AC
  Filled 2018-05-31: qty 1

## 2018-05-31 MED ORDER — SODIUM CHLORIDE 0.9 % IV SOLN
INTRAVENOUS | Status: DC
  Administered 2018-05-31: 08:00:00 via INTRAVENOUS

## 2018-05-31 NOTE — H&P (Signed)
Kimberly Love is an 67 y.o. female.   Chief Complaint: Patient is here for colonoscopy. HPI: Patient is 67 year old Caucasian female who is here for screening colonoscopy.  This is patient's first exam.  She denies abdominal pain change in bowel habits or rectal bleeding. Family history is negative for CRC.  Past Medical History:  Diagnosis Date  . Anxiety   . Arthritis    OA- L knee   . Chronic kidney disease   . Complication of anesthesia    had seizure in 1973  while waking up, after childbirth - vaginal  birth   . Depression   . GERD (gastroesophageal reflux disease)   . Hiatal hernia   . Hypertension    dr Edwyna Ready   [facility]  [facility]  . PONV (postoperative nausea and vomiting)   . Seizures ([place])    x1- 1973- post childbirth     Past Surgical History:  Procedure Laterality Date  . ABDOMINAL HYSTERECTOMY     partial   . BREAST SURGERY     bio  2012   neg  . CHOLECYSTECTOMY    . ESOPHAGOGASTRODUODENOSCOPY N/A 02/05/2016   Procedure: ESOPHAGOGASTRODUODENOSCOPY (EGD);  Surgeon: Rogene Houston, MD;  Location: [facility];  Service: Endoscopy;  Laterality: N/A;  12:25 - moved to 10:40 - Ann notified pt  . orthorscopy     rt knee  . TONSILLECTOMY    . TOTAL KNEE ARTHROPLASTY  12/26/2011   Procedure: TOTAL KNEE ARTHROPLASTY;  Surgeon: Rudean Haskell, MD;  Location: [facility];  Service: Orthopedics;  Laterality: Right;  right total knee arthroplasty  . TOTAL KNEE ARTHROPLASTY  07/02/2012   left knee  . TOTAL KNEE ARTHROPLASTY  07/02/2012   Procedure: TOTAL KNEE ARTHROPLASTY;  Surgeon: Rudean Haskell, MD;  Location: [facility];  Service: Orthopedics;  Laterality: Left;  left total knee replacement  . TUBAL LIGATION      Family History  Problem Relation Age of Onset  . Colon cancer Neg Hx    Social History:  reports that she has been smoking cigarettes. She has a 48.00 pack-year smoking history. She has never used smokeless tobacco. She reports that she does not drink alcohol or  use drugs.  Allergies:  Allergies  Allergen Reactions  . Oxycodone Anaphylaxis    Patient ended up in icu    Medications Prior to Admission  Medication Sig Dispense Refill  . Desvenlafaxine Succinate ER 25 MG TB24 Take 25 mg by mouth daily.    Marland Kitchen ibuprofen (ADVIL,MOTRIN) 200 MG tablet Take 400-600 mg by mouth daily as needed for headache or moderate pain.    Marland Kitchen lisinopril-hydrochlorothiazide (PRINZIDE,ZESTORETIC) 20-25 MG per tablet Take 1 tablet by mouth daily.     . ranitidine (ZANTAC) 150 MG tablet Take 150 mg by mouth daily as needed for heartburn.      No results found for this or any previous visit (from the past 48 hour(s)). No results found.  ROS  Blood pressure (!) 119/51, pulse 86, temperature 98.4 F (36.9 C), temperature source Oral, resp. rate 19, height 5' 6.5" (1.689 m), weight 111.1 kg, SpO2 98 %. Physical Exam  Constitutional:  Well-developed obese Caucasian female in NAD.  HENT:  Mouth/Throat: Oropharynx is clear and moist.  Eyes: Conjunctivae are normal.  Neck: No thyromegaly present.  Cardiovascular: Normal rate, regular rhythm and normal heart sounds.  No murmur heard. Respiratory: Effort normal and breath sounds normal.  GI:  Abdomen is full.  Right subcostal scar.  Abdomen is soft and nontender with organomegaly or masses.  Musculoskeletal: She exhibits no edema.  Lymphadenopathy:    She has no cervical adenopathy.  Neurological: She is alert.  Skin: Skin is warm and dry.     Assessment/Plan Average or screening colonoscopy.  Hildred Laser, MD 05/31/2018, 8:20 AM

## 2018-05-31 NOTE — Discharge Instructions (Signed)
No aspirin or NSAIDs for 10 days. Take Tylenol up to 2 g a day as needed instead of ibuprofen. Resume other medications as before. High-fiber diet. No driving for 24 hours. Physician will call with biopsy results and further recommendations    Colonoscopy, Adult, Care After This sheet gives you information about how to care for yourself after your procedure. Your doctor may also give you more specific instructions. If you have problems or questions, call your doctor. Follow these instructions at home: General instructions   For the first 24 hours after the procedure: ? Do not drive or use machinery. ? Do not sign important documents. ? Do not drink alcohol. ? Do your daily activities more slowly than normal. ? Eat foods that are soft and easy to digest. ? Rest often.  Take over-the-counter or prescription medicines only as told by your doctor.  It is up to you to get the results of your procedure. Ask your doctor, or the department performing the procedure, when your results will be ready. To help cramping and bloating:  Try walking around.  Put heat on your belly (abdomen) as told by your doctor. Use a heat source that your doctor recommends, such as a moist heat pack or a heating pad. ? Put a towel between your skin and the heat source. ? Leave the heat on for 20-30 minutes. ? Remove the heat if your skin turns bright red. This is especially important if you cannot feel pain, heat, or cold. You can get burned. Eating and drinking  Drink enough fluid to keep your pee (urine) clear or pale yellow.  Return to your normal diet as told by your doctor. Avoid heavy or fried foods that are hard to digest.  Avoid drinking alcohol for as long as told by your doctor. Contact a doctor if:  You have blood in your poop (stool) 2-3 days after the procedure. Get help right away if:  You have more than a small amount of blood in your poop.  You see large clumps of tissue (blood clots)  in your poop.  Your belly is swollen.  You feel sick to your stomach (nauseous).  You throw up (vomit).  You have a fever.  You have belly pain that gets worse, and medicine does not help your pain. This information is not intended to replace advice given to you by your health care provider. Make sure you discuss any questions you have with your health care provider. Document Released: 09/17/2010 Document Revised: 05/09/2016 Document Reviewed: 05/09/2016 Elsevier Interactive Patient Education  2017 [place].  High-Fiber Diet Fiber, also called dietary fiber, is a type of carbohydrate found in fruits, vegetables, whole grains, and beans. A high-fiber diet can have many health benefits. Your health care provider may recommend a high-fiber diet to help:  Prevent constipation. Fiber can make your bowel movements more regular.  Lower your cholesterol.  Relieve hemorrhoids, uncomplicated diverticulosis, or irritable bowel syndrome.  Prevent overeating as part of a weight-loss plan.  Prevent heart disease, type 2 diabetes, and certain cancers.  What is my plan? The recommended daily intake of fiber includes:  38 grams for men under age 62.  64 grams for men over age 38.  44 grams for women under age 35.  62 grams for women over age 49.  You can get the recommended daily intake of dietary fiber by eating a variety of fruits, vegetables, grains, and beans. Your health care provider may also recommend a fiber supplement  if it is not possible to get enough fiber through your diet. What do I need to know about a high-fiber diet?  Fiber supplements have not been widely studied for their effectiveness, so it is better to get fiber through food sources.  Always check the fiber content on thenutrition facts label of any prepackaged food. Look for foods that contain at least 5 grams of fiber per serving.  Ask your dietitian if you have questions about specific foods that are  related to your condition, especially if those foods are not listed in the following section.  Increase your daily fiber consumption gradually. Increasing your intake of dietary fiber too quickly may cause bloating, cramping, or gas.  Drink plenty of water. Water helps you to digest fiber. What foods can I eat? Grains Whole-grain breads. Multigrain cereal. Oats and oatmeal. Brown rice. Barley. Bulgur wheat. [place]. Bran muffins. Popcorn. Rye wafer crackers. Vegetables Sweet potatoes. Spinach. Kale. Artichokes. Cabbage. Broccoli. Green peas. Carrots. Squash. Fruits Berries. Pears. Apples. Oranges. Avocados. Prunes and raisins. Dried figs. Meats and Other Protein Sources [place], kidney, pinto, and soy beans. Split peas. Lentils. Nuts and seeds. Dairy Fiber-fortified yogurt. Beverages Fiber-fortified soy milk. Fiber-fortified orange juice. Other Fiber bars. The items listed above may not be a complete list of recommended foods or beverages. Contact your dietitian for more options. What foods are not recommended? Grains White bread. Pasta made with refined flour. White rice. Vegetables Fried potatoes. Canned vegetables. Well-cooked vegetables. Fruits Fruit juice. Cooked, strained fruit. Meats and Other Protein Sources Fatty cuts of meat. Fried Sales executive or fried fish. Dairy Milk. Yogurt. Cream cheese. Sour cream. Beverages Soft drinks. Other Cakes and pastries. Butter and oils. The items listed above may not be a complete list of foods and beverages to avoid. Contact your dietitian for more information. What are some tips for including high-fiber foods in my diet?  Eat a wide variety of high-fiber foods.  Make sure that half of all grains consumed each day are whole grains.  Replace breads and cereals made from refined flour or white flour with whole-grain breads and cereals.  Replace white rice with brown rice, bulgur wheat, or millet.  Start the day with a breakfast that is  high in fiber, such as a cereal that contains at least 5 grams of fiber per serving.  Use beans in place of meat in soups, salads, or pasta.  Eat high-fiber snacks, such as berries, raw vegetables, nuts, or popcorn. This information is not intended to replace advice given to you by your health care provider. Make sure you discuss any questions you have with your health care provider. Document Released: 08/15/2005 Document Revised: 01/21/2016 Document Reviewed: 01/28/2014 Elsevier Interactive Patient Education  2018 [employer].  Colon Polyps Polyps are tissue growths inside the body. Polyps can grow in many places, including the large intestine (colon). A polyp may be a round bump or a mushroom-shaped growth. You could have one polyp or several. Most colon polyps are noncancerous (benign). However, some colon polyps can become cancerous over time. What are the causes? The exact cause of colon polyps is not known. What increases the risk? This condition is more likely to develop in people who:  Have a family history of colon cancer or colon polyps.  Are older than 23 or older than 45 if they are African American.  Have inflammatory bowel disease, such as ulcerative colitis or Crohn disease.  Are overweight.  Smoke cigarettes.  Do not get enough exercise.  Drink too much alcohol.  Eat a diet that is: ? High in fat and red meat. ? Low in fiber.  Had childhood cancer that was treated with abdominal radiation.  What are the signs or symptoms? Most polyps do not cause symptoms. If you have symptoms, they may include:  Blood coming from your rectum when having a bowel movement.  Blood in your stool.The stool may look dark red or black.  A change in bowel habits, such as constipation or diarrhea.  How is this diagnosed? This condition is diagnosed with a colonoscopy. This is a procedure that uses a lighted, flexible scope to look at the inside of your colon. How is this  treated? Treatment for this condition involves removing any polyps that are found. Those polyps will then be tested for cancer. If cancer is found, your health care provider will talk to you about options for colon cancer treatment. Follow these instructions at home: Diet  Eat plenty of fiber, such as fruits, vegetables, and whole grains.  Eat foods that are high in calcium and vitamin D, such as milk, cheese, yogurt, eggs, liver, fish, and broccoli.  Limit foods high in fat, red meats, and processed meats, such as hot dogs, sausage, bacon, and lunch meats.  Maintain a healthy weight, or lose weight if recommended by your health care provider. General instructions  Do not smoke cigarettes.  Do not drink alcohol excessively.  Keep all follow-up visits as told by your health care provider. This is important. This includes keeping regularly scheduled colonoscopies. Talk to your health care provider about when you need a colonoscopy.  Exercise every day or as told by your health care provider. Contact a health care provider if:  You have new or worsening bleeding during a bowel movement.  You have new or increased blood in your stool.  You have a change in bowel habits.  You unexpectedly lose weight. This information is not intended to replace advice given to you by your health care provider. Make sure you discuss any questions you have with your health care provider. Document Released: 05/11/2004 Document Revised: 01/21/2016 Document Reviewed: 07/06/2015 Elsevier Interactive Patient Education  [employer].

## 2018-05-31 NOTE — Op Note (Signed)
[facility] Patient Name: Kimberly Love Procedure Date: 05/31/2018 7:59 AM MRN: [id number] Date of Birth: 01-28-1951 Attending MD: Hildred Laser , MD CSN: [id number] Age: 67 Admit Type: Outpatient Procedure:                Colonoscopy Indications:              Screening for colorectal malignant neoplasm Providers:                Hildred Laser, MD, Otis Peak B. Sharon Seller, RN, Aram Candela Referring MD:             Delphina Cahill, MD Medicines:                Meperidine 50 mg IV, Midazolam 6 mg IV Complications:            No immediate complications. Estimated Blood Loss:     Estimated blood loss was minimal. Procedure:                Pre-Anesthesia Assessment:                           - Prior to the procedure, a History and Physical                            was performed, and patient medications and                            allergies were reviewed. The patient's tolerance of                            previous anesthesia was also reviewed. The risks                            and benefits of the procedure and the sedation                            options and risks were discussed with the patient.                            All questions were answered, and informed consent                            was obtained. Prior Anticoagulants: The patient has                            taken no previous anticoagulant or antiplatelet                            agents. ASA Grade Assessment: II - A patient with                            mild systemic disease. After reviewing the risks  and benefits, the patient was deemed in                            satisfactory condition to undergo the procedure.                           After obtaining informed consent, the colonoscope                            was passed under direct vision. Throughout the                            procedure, the patient's blood pressure, pulse, and                oxygen saturations were monitored continuously. The                            PCF-[id number] ([id number]) was introduced through the                            anus and advanced to the the cecum, identified by                            appendiceal orifice and ileocecal valve. The                            colonoscopy was performed without difficulty. The                            patient tolerated the procedure well. The quality                            of the bowel preparation was adequate. The                            ileocecal valve, appendiceal orifice, and rectum                            were photographed. Scope In: 8:29:19 AM Scope Out: 9:15:42 AM Scope Withdrawal Time: 0 hours 38 minutes 19 seconds  Total Procedure Duration: 0 hours 46 minutes 23 seconds  Findings:      The perianal and digital rectal examinations were normal.      A large polyp was found in the cecum. The polyp was carpet-like. Area       was successfully injected with Eleview for lesion assessment, and this       injection appeared to lift the lesion adequately. The polyp was removed       with a piecemeal technique using a hot snare. Polyp resection was       incomplete. The resected tissue was retrieved using a suction (via the       working channel). Coagulation for destruction of remaining portion of       lesion using argon plasma was successful. The pathology specimen was       placed into Bottle Number 1.  Five polyps were found in the sigmoid colon and transverse colon. The       polyps were small in size. These polyps were removed with a cold snare.       Resection and retrieval were complete. The pathology specimen was placed       into Bottle Number 2.      A 7 mm polyp was found in the rectum. The polyp was removed with a hot       snare. Resection and retrieval were complete. The pathology specimen was       placed into Bottle Number 3.      A few small-mouthed diverticula were  found in the sigmoid colon.      No additional abnormalities were found on retroflexion. Impression:               - One large polyp in the cecum, removed piecemeal                            using a hot snare. Incomplete resection. Resected                            tissue retrieved. Injected. Treated with argon                            plasma coagulation (APC).                           - Five small polyps in the sigmoid colon and in the                            transverse colon, removed with a cold snare.                            Resected and retrieved.                           - One 7 mm polyp in the rectum, removed with a hot                            snare. Resected and retrieved.                           - Diverticulosis in the sigmoid colon. Moderate Sedation:      Moderate (conscious) sedation was administered by the endoscopy nurse       and supervised by the endoscopist. The following parameters were       monitored: oxygen saturation, heart rate, blood pressure, CO2       capnography and response to care. Total physician intraservice time was       51 minutes. Recommendation:           - Patient has a contact number available for                            emergencies. The signs and symptoms of potential  delayed complications were discussed with the                            patient. Return to normal activities tomorrow.                            Written discharge instructions were provided to the                            patient.                           - High fiber diet today.                           - Continue present medications.                           - No aspirin, ibuprofen, naproxen, or other                            non-steroidal anti-inflammatory drugs for 10 days                            after polyp removal.                           - Await pathology results.                           - Repeat colonoscopy is  recommended. The                            colonoscopy date will be determined after pathology                            results from today's exam become available for                            review. Procedure Code(s):        --- Professional ---                           [phone], Colonoscopy, flexible; with removal of                            tumor(s), polyp(s), or other lesion(s) by snare                            technique                           45381, Colonoscopy, flexible; with directed                            submucosal injection(s), any substance  G0500, Moderate sedation services provided by the                            same physician or other qualified health care                            professional performing a gastrointestinal                            endoscopic service that sedation supports,                            requiring the presence of an independent trained                            observer to assist in the monitoring of the                            patient's level of consciousness and physiological                            status; initial 15 minutes of intra-service time;                            patient age 81 years or older (additional time may                            be reported with [phone], as appropriate)                           [phone], Moderate sedation services provided by the                            same physician or other qualified health care                            professional performing the diagnostic or                            therapeutic service that the sedation supports,                            requiring the presence of an independent trained                            observer to assist in the monitoring of the                            patient's level of consciousness and physiological                            status; each additional 15 minutes intraservice  time (List separately in addition to code for                            primary service)                           [id number], Moderate sedation services provided by the                            same physician or other qualified health care                            professional performing the diagnostic or                            therapeutic service that the sedation supports,                            requiring the presence of an independent trained                            observer to assist in the monitoring of the                            patient's level of consciousness and physiological                            status; each additional 15 minutes intraservice                            time (List separately in addition to code for                            primary service) Diagnosis Code(s):        --- Professional ---                           Z12.11, Encounter for screening for malignant                            neoplasm of colon                           D12.0, Benign neoplasm of cecum                           K62.1, Rectal polyp                           D12.5, Benign neoplasm of sigmoid colon                           D12.3, Benign neoplasm of transverse colon (hepatic                            flexure or splenic flexure)  K57.30, Diverticulosis of large intestine without                            perforation or abscess without bleeding CPT copyright 2017 American Medical Association. All rights reserved. The codes documented in this report are preliminary and upon coder review may  be revised to meet current compliance requirements. Hildred Laser, MD Hildred Laser, MD 05/31/2018 9:26:18 AM This report has been signed electronically. Number of Addenda: 0

## 2018-06-05 ENCOUNTER — Encounter (HOSPITAL_COMMUNITY): Payer: Self-pay | Admitting: Internal Medicine

## 2018-10-16 ENCOUNTER — Encounter (INDEPENDENT_AMBULATORY_CARE_PROVIDER_SITE_OTHER): Payer: Self-pay | Admitting: *Deleted

## 2018-10-16 NOTE — Telephone Encounter (Signed)
error    This encounter was created in error - please disregard.

## 2018-10-17 ENCOUNTER — Other Ambulatory Visit (INDEPENDENT_AMBULATORY_CARE_PROVIDER_SITE_OTHER): Payer: Self-pay | Admitting: *Deleted

## 2018-10-17 DIAGNOSIS — Z86010 Personal history of colonic polyps: Secondary | ICD-10-CM | POA: Insufficient documentation

## 2018-10-29 ENCOUNTER — Telehealth (INDEPENDENT_AMBULATORY_CARE_PROVIDER_SITE_OTHER): Payer: Self-pay | Admitting: *Deleted

## 2018-10-29 NOTE — Telephone Encounter (Signed)
agree

## 2018-10-29 NOTE — Telephone Encounter (Signed)
Referring MD/PCP:hall   Procedure:tcs  Reason/Indication:hx polyps  Has patient had this procedure before?yes, 05/2018 If so, when, by whom and where?   Is there a family history of colon cancer?no Who? What age when diagnosed?   Is patient diabetic?no  Does patient have prosthetic heart valveor mechanical valve?no  Do you have a pacemaker?no  Has patient ever had endocarditis?no  Has patient had joint replacement within last 12 months?no  Is patient constipated or take laxatives?no  Does patient have a history of alcohol/drug use?no  Is patient on Coumadin, Plavix and/or Aspirin?no  Medications:see epic  Allergies:any med w/ codeine  Medication Adjustment per Dr Laural Golden:   Procedure date & time:11/28/18 at 79

## 2018-11-28 ENCOUNTER — Encounter (HOSPITAL_COMMUNITY): Payer: Self-pay

## 2018-11-28 ENCOUNTER — Ambulatory Visit (HOSPITAL_COMMUNITY): Admit: 2018-11-28 | Payer: Self-pay | Admitting: Internal Medicine

## 2018-11-28 SURGERY — COLONOSCOPY
Anesthesia: Moderate Sedation

## 2018-12-31 ENCOUNTER — Other Ambulatory Visit (INDEPENDENT_AMBULATORY_CARE_PROVIDER_SITE_OTHER): Payer: Self-pay | Admitting: *Deleted

## 2018-12-31 DIAGNOSIS — Z86010 Personal history of colonic polyps: Secondary | ICD-10-CM

## 2019-04-08 ENCOUNTER — Encounter (INDEPENDENT_AMBULATORY_CARE_PROVIDER_SITE_OTHER): Payer: Self-pay | Admitting: *Deleted

## 2019-04-09 ENCOUNTER — Other Ambulatory Visit: Payer: Self-pay

## 2019-04-09 ENCOUNTER — Ambulatory Visit (INDEPENDENT_AMBULATORY_CARE_PROVIDER_SITE_OTHER): Payer: Self-pay

## 2019-05-07 ENCOUNTER — Other Ambulatory Visit (HOSPITAL_COMMUNITY)
Admission: RE | Admit: 2019-05-07 | Discharge: 2019-05-07 | Disposition: A | Discharge status: Home / Self Care | Payer: 59 | Source: Ambulatory Visit | Attending: Internal Medicine | Admitting: Internal Medicine

## 2019-05-07 ENCOUNTER — Other Ambulatory Visit: Payer: Self-pay

## 2019-05-07 DIAGNOSIS — Z01812 Encounter for preprocedural laboratory examination: Secondary | ICD-10-CM | POA: Diagnosis present

## 2019-05-07 DIAGNOSIS — Z20828 Contact with and (suspected) exposure to other viral communicable diseases: Secondary | ICD-10-CM | POA: Insufficient documentation

## 2019-05-08 LAB — SARS CORONAVIRUS 2 (TAT 6-24 HRS): SARS Coronavirus 2: NEGATIVE

## 2019-05-09 ENCOUNTER — Other Ambulatory Visit: Payer: Self-pay

## 2019-05-09 ENCOUNTER — Encounter (HOSPITAL_COMMUNITY): Payer: Self-pay | Admitting: *Deleted

## 2019-05-09 ENCOUNTER — Ambulatory Visit (HOSPITAL_COMMUNITY)
Admission: RE | Admit: 2019-05-09 | Discharge: 2019-05-09 | Disposition: A | Discharge status: Home / Self Care | Payer: 59 | Attending: Internal Medicine | Admitting: Internal Medicine

## 2019-05-09 ENCOUNTER — Encounter (HOSPITAL_COMMUNITY)
Admission: RE | Disposition: A | Discharge status: Home / Self Care | Payer: Self-pay | Source: Home / Self Care | Attending: Internal Medicine

## 2019-05-09 DIAGNOSIS — Z1211 Encounter for screening for malignant neoplasm of colon: Secondary | ICD-10-CM | POA: Diagnosis present

## 2019-05-09 DIAGNOSIS — K5520 Angiodysplasia of colon without hemorrhage: Secondary | ICD-10-CM | POA: Diagnosis not present

## 2019-05-09 DIAGNOSIS — Z90710 Acquired absence of both cervix and uterus: Secondary | ICD-10-CM | POA: Diagnosis not present

## 2019-05-09 DIAGNOSIS — F17210 Nicotine dependence, cigarettes, uncomplicated: Secondary | ICD-10-CM | POA: Diagnosis not present

## 2019-05-09 DIAGNOSIS — N189 Chronic kidney disease, unspecified: Secondary | ICD-10-CM | POA: Diagnosis not present

## 2019-05-09 DIAGNOSIS — Z96653 Presence of artificial knee joint, bilateral: Secondary | ICD-10-CM | POA: Diagnosis not present

## 2019-05-09 DIAGNOSIS — K5730 Diverticulosis of large intestine without perforation or abscess without bleeding: Secondary | ICD-10-CM | POA: Insufficient documentation

## 2019-05-09 DIAGNOSIS — Z860100 Personal history of colon polyps, unspecified: Secondary | ICD-10-CM

## 2019-05-09 DIAGNOSIS — Z885 Allergy status to narcotic agent status: Secondary | ICD-10-CM | POA: Insufficient documentation

## 2019-05-09 DIAGNOSIS — K449 Diaphragmatic hernia without obstruction or gangrene: Secondary | ICD-10-CM | POA: Insufficient documentation

## 2019-05-09 DIAGNOSIS — K219 Gastro-esophageal reflux disease without esophagitis: Secondary | ICD-10-CM | POA: Diagnosis not present

## 2019-05-09 DIAGNOSIS — Z79899 Other long term (current) drug therapy: Secondary | ICD-10-CM | POA: Diagnosis not present

## 2019-05-09 DIAGNOSIS — D123 Benign neoplasm of transverse colon: Secondary | ICD-10-CM

## 2019-05-09 DIAGNOSIS — I129 Hypertensive chronic kidney disease with stage 1 through stage 4 chronic kidney disease, or unspecified chronic kidney disease: Secondary | ICD-10-CM | POA: Diagnosis not present

## 2019-05-09 DIAGNOSIS — Z09 Encounter for follow-up examination after completed treatment for conditions other than malignant neoplasm: Secondary | ICD-10-CM

## 2019-05-09 DIAGNOSIS — F419 Anxiety disorder, unspecified: Secondary | ICD-10-CM | POA: Diagnosis not present

## 2019-05-09 DIAGNOSIS — Z9049 Acquired absence of other specified parts of digestive tract: Secondary | ICD-10-CM | POA: Diagnosis not present

## 2019-05-09 DIAGNOSIS — Z791 Long term (current) use of non-steroidal anti-inflammatories (NSAID): Secondary | ICD-10-CM | POA: Diagnosis not present

## 2019-05-09 DIAGNOSIS — D125 Benign neoplasm of sigmoid colon: Secondary | ICD-10-CM

## 2019-05-09 DIAGNOSIS — F329 Major depressive disorder, single episode, unspecified: Secondary | ICD-10-CM | POA: Diagnosis not present

## 2019-05-09 HISTORY — PX: POLYPECTOMY: SHX5525

## 2019-05-09 HISTORY — PX: COLONOSCOPY: SHX5424

## 2019-05-09 SURGERY — COLONOSCOPY
Anesthesia: Moderate Sedation

## 2019-05-09 MED ORDER — STERILE WATER FOR IRRIGATION IR SOLN
Status: DC | PRN
  Administered 2019-05-09: 2.5 mL

## 2019-05-09 MED ORDER — MIDAZOLAM HCL 5 MG/5ML IJ SOLN
INTRAMUSCULAR | Status: AC
  Filled 2019-05-09: qty 10

## 2019-05-09 MED ORDER — MEPERIDINE HCL 50 MG/ML IJ SOLN
INTRAMUSCULAR | Status: AC
  Filled 2019-05-09: qty 1

## 2019-05-09 MED ORDER — SODIUM CHLORIDE 0.9 % IV SOLN
INTRAVENOUS | Status: DC
  Administered 2019-05-09: 09:00:00 via INTRAVENOUS

## 2019-05-09 MED ORDER — MIDAZOLAM HCL 5 MG/5ML IJ SOLN
INTRAMUSCULAR | Status: DC | PRN
  Administered 2019-05-09: 2 mg via INTRAVENOUS
  Administered 2019-05-09: 1 mg via INTRAVENOUS
  Administered 2019-05-09 (×2): 2 mg via INTRAVENOUS

## 2019-05-09 MED ORDER — MEPERIDINE HCL 50 MG/ML IJ SOLN
INTRAMUSCULAR | Status: DC | PRN
  Administered 2019-05-09 (×2): 25 mg via INTRAVENOUS

## 2019-05-09 NOTE — Op Note (Signed)
[facility] Patient Name: Kimberly Love Procedure Date: 05/09/2019 9:18 AM MRN: [id number] Date of Birth: 02/28/1951 Attending MD: Hildred Laser , MD CSN: [id number] Age: 68 Admit Type: Outpatient Procedure:                Colonoscopy Indications:              High risk colon cancer surveillance: Personal                            history of colonic polyps Providers:                Hildred Laser, MD, Gwynneth Albright RN, RN,                            Raphael Gibney, Technician Referring MD:             Delphina Cahill, MD Medicines:                Meperidine 50 mg IV, Midazolam 7 mg IV Complications:            No immediate complications. Estimated Blood Loss:     Estimated blood loss was minimal. Procedure:                Pre-Anesthesia Assessment:                           - Prior to the procedure, a History and Physical                            was performed, and patient medications and                            allergies were reviewed. The patient's tolerance of                            previous anesthesia was also reviewed. The risks                            and benefits of the procedure and the sedation                            options and risks were discussed with the patient.                            All questions were answered, and informed consent                            was obtained. Prior Anticoagulants: The patient has                            taken no previous anticoagulant or antiplatelet                            agents. ASA Grade Assessment: II - A patient with  mild systemic disease. After reviewing the risks                            and benefits, the patient was deemed in                            satisfactory condition to undergo the procedure.                           After obtaining informed consent, the colonoscope                            was passed under direct vision. Throughout the          procedure, the patient's blood pressure, pulse, and                            oxygen saturations were monitored continuously. The                            [id number] scope was introduced through                            the anus and advanced to the the cecum, identified                            by appendiceal orifice and ileocecal valve. The                            colonoscopy was performed without difficulty. The                            patient tolerated the procedure well. The quality                            of the bowel preparation was adequate. The                            ileocecal valve, appendiceal orifice, and rectum                            were photographed. Scope In: 9:35:57 AM Scope Out: 9:58:08 AM Scope Withdrawal Time: 0 hours 11 minutes 53 seconds  Total Procedure Duration: 0 hours 22 minutes 11 seconds  Findings:      The perianal and digital rectal examinations were normal.      Two small angiodysplastic lesions without bleeding were found in the       cecum.      Two sessile polyps were found in the sigmoid colon and transverse colon.       The polyps were small in size. These polyps were removed with a cold       snare. Resection and retrieval were complete. The pathology specimen was       placed into Bottle Number 1.      A few diverticula were found in the sigmoid colon.  The retroflexed view of the distal rectum and anal verge was normal and       showed no anal or rectal abnormalities. Impression:               - Two non-bleeding colonic angiodysplastic lesions.                           - Two small polyps in the sigmoid colon and in the                            transverse colon, removed with a cold snare.                            Resected and retrieved.                           - Diverticulosis in the sigmoid colon. Moderate Sedation:      Moderate (conscious) sedation was administered by the endoscopy nurse        and supervised by the endoscopist. The following parameters were       monitored: oxygen saturation, heart rate, blood pressure, CO2       capnography and response to care. Total physician intraservice time was       29 minutes. Recommendation:           - Patient has a contact number available for                            emergencies. The signs and symptoms of potential                            delayed complications were discussed with the                            patient. Return to normal activities tomorrow.                            Written discharge instructions were provided to the                            patient.                           - High fiber diet today.                           - Continue present medications.                           - No aspirin, ibuprofen, naproxen, or other                            non-steroidal anti-inflammatory drugs for 1 day.                           - Await pathology results.                           -  Repeat colonoscopy in 5 years for surveillance. Procedure Code(s):        --- Professional ---                           [phone], Colonoscopy, flexible; with removal of                            tumor(s), polyp(s), or other lesion(s) by snare                            technique                           99153, Moderate sedation; each additional 15                            minutes intraservice time                           G0500, Moderate sedation services provided by the                            same physician or other qualified health care                            professional performing a gastrointestinal                            endoscopic service that sedation supports,                            requiring the presence of an independent trained                            observer to assist in the monitoring of the                            patient's level of consciousness and physiological                             status; initial 15 minutes of intra-service time;                            patient age 46 years or older (additional time may                            be reported with [phone], as appropriate) Diagnosis Code(s):        --- Professional ---                           Z86.010, Personal history of colonic polyps                           K55.20, Angiodysplasia of colon without hemorrhage  K63.5, Polyp of colon                           K57.30, Diverticulosis of large intestine without                            perforation or abscess without bleeding CPT copyright 2019 [employer]. All rights reserved. The codes documented in this report are preliminary and upon coder review may  be revised to meet current compliance requirements. Hildred Laser, MD Hildred Laser, MD 05/09/2019 10:12:07 AM This report has been signed electronically. Number of Addenda: 0

## 2019-05-09 NOTE — H&P (Signed)
Kimberly Love is an 68 y.o. female.   Chief Complaint: Patient is here for colonoscopy. HPI: Patient is 68 year old Caucasian female who underwent screening colonoscopy in October last year.  She was found to have flat carpet-like polyp at cecum raised in a piecemeal fashion after elevating it with eleview.  This polyp was tubular adenoma.  She had 5 other small tubular adenomas.  She was advised to return in 1 year to make sure she does not have a residual cecal polyp. He denies abdominal pain change in bowel habits or rectal bleeding. Family history is negative for CRC.  Past Medical History:  Diagnosis Date  . Anxiety   . Arthritis    OA- L knee   . Chronic kidney disease   . Complication of anesthesia    had seizure in 1973  while waking up, after childbirth - vaginal  birth   . Depression   . GERD (gastroesophageal reflux disease)   . Hiatal hernia   . Hypertension    dr Edwyna Ready   [facility]  [place]  . PONV (postoperative nausea and vomiting)   . Seizures ([place])    x1- 1973- post childbirth     Past Surgical History:  Procedure Laterality Date  . ABDOMINAL HYSTERECTOMY     partial   . BREAST SURGERY     bio  2012   neg  . CHOLECYSTECTOMY    . COLONOSCOPY N/A 05/31/2018   Procedure: COLONOSCOPY;  Surgeon: Rogene Houston, MD;  [facility]: [facility];  Service: Endoscopy;  Laterality: N/A;  830  . ESOPHAGOGASTRODUODENOSCOPY N/A 02/05/2016   Procedure: ESOPHAGOGASTRODUODENOSCOPY (EGD);  Surgeon: Rogene Houston, MD;  Location: [facility];  Service: Endoscopy;  Laterality: N/A;  12:25 - moved to 10:40 - Ann notified pt  . orthorscopy     rt knee  . POLYPECTOMY  05/31/2018   Procedure: POLYPECTOMY;  Surgeon: Rogene Houston, MD;  Location: [facility];  Service: Endoscopy;;  colon  . TONSILLECTOMY    . TOTAL KNEE ARTHROPLASTY  12/26/2011   Procedure: TOTAL KNEE ARTHROPLASTY;  Surgeon: Rudean Haskell, MD;  Location: [place];  Service: Orthopedics;  Laterality: Right;  right  total knee arthroplasty  . TOTAL KNEE ARTHROPLASTY  07/02/2012   left knee  . TOTAL KNEE ARTHROPLASTY  07/02/2012   Procedure: TOTAL KNEE ARTHROPLASTY;  Surgeon: Rudean Haskell, MD;  Location: [place];  Service: Orthopedics;  Laterality: Left;  left total knee replacement  . TUBAL LIGATION      Family History  Problem Relation Age of Onset  . Colon cancer Neg Hx    Social History:  reports that she has been smoking cigarettes. She has a 48.00 pack-year smoking history. She has never used smokeless tobacco. She reports that she does not drink alcohol or use drugs.  Allergies:  Allergies  Allergen Reactions  . Oxycodone Anaphylaxis    Patient ended up in icu    Medications Prior to Admission  Medication Sig Dispense Refill  . escitalopram (LEXAPRO) 20 MG tablet Take 20 mg by mouth daily.    Marland Kitchen ibuprofen (ADVIL,MOTRIN) 200 MG tablet Take 400-600 mg by mouth daily as needed for headache or moderate pain.    Marland Kitchen lisinopril-hydrochlorothiazide (PRINZIDE,ZESTORETIC) 20-25 MG per tablet Take 1 tablet by mouth daily.     Marland Kitchen LORazepam (ATIVAN) 0.5 MG tablet Take 0.5 mg by mouth at bedtime as needed for sleep.    Marland Kitchen omeprazole (PRILOSEC) 20 MG capsule Take 20 mg  by mouth daily.    . pravastatin (PRAVACHOL) 10 MG tablet Take 10 mg by mouth daily.      No results found for this or any previous visit (from the past 48 hour(s)). No results found.  ROS  Blood pressure 138/65, pulse 85, temperature 98.6 F (37 C), temperature source Oral, resp. rate 20, height 5' 6.5" (1.689 m), weight 108.9 kg, SpO2 99 %. Physical Exam  Constitutional: She appears well-nourished.  HENT:  Mouth/Throat: Oropharynx is clear and moist.  Eyes: Conjunctivae are normal. No scleral icterus.  Neck: No thyromegaly present.  Cardiovascular: Normal rate, regular rhythm and normal heart sounds.  No murmur heard. Respiratory: Effort normal and breath sounds normal.  GI:  Abdomen is full.  She has long right subcostal  scar.  She has small periumbilical scar.  Abdomen is soft and nontender with organomegaly or masses  Musculoskeletal:        General: No edema.  Lymphadenopathy:    She has no cervical adenopathy.  Neurological: She is alert.  Skin: Skin is warm and dry.     Assessment/Plan History of multiple colonic adenomas. Surveillance colonoscopy.  Hildred Laser, MD 05/09/2019, 9:25 AM

## 2019-05-09 NOTE — Discharge Instructions (Signed)
No aspirin or NSAIDs for 24 hours. Resume usual medications as before. High-fiber diet. No driving for 24 hours. Physician will call with biopsy results. Next colonoscopy in 5 years.   Colonoscopy, Adult, Care After This sheet gives you information about how to care for yourself after your procedure. Your doctor may also give you more specific instructions. If you have problems or questions, call your doctor. What can I expect after the procedure? After the procedure, it is common to have:  A small amount of blood in your poop for 24 hours.  Some gas.  Mild cramping or bloating in your belly. Follow these instructions at home: General instructions  For the first 24 hours after the procedure: ? Do not drive or use machinery. ? Do not sign important documents. ? Do not drink alcohol. ? Do your daily activities more slowly than normal. ? Eat foods that are soft and easy to digest.  Take over-the-counter or prescription medicines only as told by your doctor. To help cramping and bloating:   Try walking around.  Put heat on your belly (abdomen) as told by your doctor. Use a heat source that your doctor recommends, such as a moist heat pack or a heating pad. ? Put a towel between your skin and the heat source. ? Leave the heat on for 20-30 minutes. ? Remove the heat if your skin turns bright red. This is especially important if you cannot feel pain, heat, or cold. You can get burned. Eating and drinking   Drink enough fluid to keep your pee (urine) clear or pale yellow.  Return to your normal diet as told by your doctor. Avoid heavy or fried foods that are hard to digest.  Avoid drinking alcohol for as long as told by your doctor. Contact a doctor if:  You have blood in your poop (stool) 2-3 days after the procedure. Get help right away if:  You have more than a small amount of blood in your poop.  You see large clumps of tissue (blood clots) in your poop.  Your belly  is swollen.  You feel sick to your stomach (nauseous).  You throw up (vomit).  You have a fever.  You have belly pain that gets worse, and medicine does not help your pain. Summary  After the procedure, it is common to have a small amount of blood in your poop. You may also have mild cramping and bloating in your belly.  For the first 24 hours after the procedure, do not drive or use machinery, do not sign important documents, and do not drink alcohol.  Get help right away if you have a lot of blood in your poop, feel sick to your stomach, have a fever, or have more belly pain. This information is not intended to replace advice given to you by your health care provider. Make sure you discuss any questions you have with your health care provider. Document Released: 09/17/2010 Document Revised: 06/15/2017 Document Reviewed: 05/09/2016 Elsevier Patient Education  2020 [place].  Colon Polyps  Polyps are tissue growths inside the body. Polyps can grow in many places, including the large intestine (colon). A polyp may be a round bump or a mushroom-shaped growth. You could have one polyp or several. Most colon polyps are noncancerous (benign). However, some colon polyps can become cancerous over time. Finding and removing the polyps early can help prevent this. What are the causes? The exact cause of colon polyps is not known. What increases the  risk? You are more likely to develop this condition if you:  Have a family history of colon cancer or colon polyps.  Are older than 71 or older than 45 if you are African American.  Have inflammatory bowel disease, such as ulcerative colitis or Crohn's disease.  Have certain hereditary conditions, such as: ? Familial adenomatous polyposis. ? Lynch syndrome. ? Turcot syndrome. ? Peutz-Jeghers syndrome.  Are overweight.  Smoke cigarettes.  Do not get enough exercise.  Drink too much alcohol.  Eat a diet that is high in fat and  red meat and low in fiber.  Had childhood cancer that was treated with abdominal radiation. What are the signs or symptoms? Most polyps do not cause symptoms. If you have symptoms, they may include:  Blood coming from your rectum when having a bowel movement.  Blood in your stool. The stool may look dark red or black.  Abdominal pain.  A change in bowel habits, such as constipation or diarrhea. How is this diagnosed? This condition is diagnosed with a colonoscopy. This is a procedure in which a lighted, flexible scope is inserted into the anus and then passed into the colon to examine the area. Polyps are sometimes found when a colonoscopy is done as part of routine cancer screening tests. How is this treated? Treatment for this condition involves removing any polyps that are found. Most polyps can be removed during a colonoscopy. Those polyps will then be tested for cancer. Additional treatment may be needed depending on the results of testing. Follow these instructions at home: Lifestyle  Maintain a healthy weight, or lose weight if recommended by your health care provider.  Exercise every day or as told by your health care provider.  Do not use any products that contain nicotine or tobacco, such as cigarettes and e-cigarettes. If you need help quitting, ask your health care provider.  If you drink alcohol, limit how much you have: ? 0-1 drink a day for women. ? 0-2 drinks a day for men.  Be aware of how much alcohol is in your drink. In the [place] one drink equals one 12 oz bottle of beer (355 mL), one 5 oz glass of wine (148 mL), or one 1 oz shot of hard liquor (44 mL). Eating and drinking   Eat foods that are high in fiber, such as fruits, vegetables, and whole grains.  Eat foods that are high in calcium and vitamin D, such as milk, cheese, yogurt, eggs, liver, fish, and broccoli.  Limit foods that are high in fat, such as fried foods and desserts.  Limit the amount of  red meat and processed meat you eat, such as hot dogs, sausage, bacon, and lunch meats. General instructions  Keep all follow-up visits as told by your health care provider. This is important. ? This includes having regularly scheduled colonoscopies. ? Talk to your health care provider about when you need a colonoscopy. Contact a health care provider if:  You have new or worsening bleeding during a bowel movement.  You have new or increased blood in your stool.  You have a change in bowel habits.  You lose weight for no known reason. Summary  Polyps are tissue growths inside the body. Polyps can grow in many places, including the colon.  Most colon polyps are noncancerous (benign), but some can become cancerous over time.  This condition is diagnosed with a colonoscopy.  Treatment for this condition involves removing any polyps that are found. Most polyps can  be removed during a colonoscopy. This information is not intended to replace advice given to you by your health care provider. Make sure you discuss any questions you have with your health care provider. Document Released: 05/11/2004 Document Revised: 11/30/2017 Document Reviewed: 11/30/2017 Elsevier Patient Education  [place].  High-Fiber Diet Fiber, also called dietary fiber, is a type of carbohydrate that is found in fruits, vegetables, whole grains, and beans. A high-fiber diet can have many health benefits. Your health care provider may recommend a high-fiber diet to help:  Prevent constipation. Fiber can make your bowel movements more regular.  Lower your cholesterol.  Relieve the following conditions: ? Swelling of veins in the anus (hemorrhoids). ? Swelling and irritation (inflammation) of specific areas of the digestive tract (uncomplicated diverticulosis). ? A problem of the large intestine (colon) that sometimes causes pain and diarrhea (irritable bowel syndrome, IBS).  Prevent overeating as part of  a weight-loss plan.  Prevent heart disease, type 2 diabetes, and certain cancers. What is my plan? The recommended daily fiber intake in grams (g) includes:  38 g for men age 50 or younger.  30 g for men over age 21.  81 g for women age 41 or younger.  21 g for women over age 14. You can get the recommended daily intake of dietary fiber by:  Eating a variety of fruits, vegetables, grains, and beans.  Taking a fiber supplement, if it is not possible to get enough fiber through your diet. What do I need to know about a high-fiber diet?  It is better to get fiber through food sources rather than from fiber supplements. There is not a lot of research about how effective supplements are.  Always check the fiber content on the nutrition facts label of any prepackaged food. Look for foods that contain 5 g of fiber or more per serving.  Talk with a diet and nutrition specialist (dietitian) if you have questions about specific foods that are recommended or not recommended for your medical condition, especially if those foods are not listed below.  Gradually increase how much fiber you consume. If you increase your intake of dietary fiber too quickly, you may have bloating, cramping, or gas.  Drink plenty of water. Water helps you to digest fiber. What are tips for following this plan?  Eat a wide variety of high-fiber foods.  Make sure that half of the grains that you eat each day are whole grains.  Eat breads and cereals that are made with whole-grain flour instead of refined flour or white flour.  Eat brown rice, bulgur wheat, or millet instead of white rice.  Start the day with a breakfast that is high in fiber, such as a cereal that contains 5 g of fiber or more per serving.  Use beans in place of meat in soups, salads, and pasta dishes.  Eat high-fiber snacks, such as berries, raw vegetables, nuts, and popcorn.  Choose whole fruits and vegetables instead of processed forms  like juice or sauce. What foods can I eat?  Fruits Berries. Pears. Apples. Oranges. Avocado. Prunes and raisins. Dried figs. Vegetables Sweet potatoes. Spinach. Kale. Artichokes. Cabbage. Broccoli. Cauliflower. Green peas. Carrots. Squash. Grains Whole-grain breads. Multigrain cereal. Oats and oatmeal. Brown rice. Barley. Bulgur wheat. [place]. Quinoa. Bran muffins. Popcorn. Rye wafer crackers. Meats and other proteins Navy, kidney, and pinto beans. Soybeans. Split peas. Lentils. Nuts and seeds. Dairy Fiber-fortified yogurt. Beverages Fiber-fortified soy milk. Fiber-fortified orange juice. Other foods Fiber bars.  The items listed above may not be a complete list of recommended foods and beverages. Contact a dietitian for more options. What foods are not recommended? Fruits Fruit juice. Cooked, strained fruit. Vegetables Fried potatoes. Canned vegetables. Well-cooked vegetables. Grains White bread. Pasta made with refined flour. White rice. Meats and other proteins Fatty cuts of meat. Fried chicken or fried fish. Dairy Milk. Yogurt. Cream cheese. Sour cream. Fats and oils Butters. Beverages Soft drinks. Other foods Cakes and pastries. The items listed above may not be a complete list of foods and beverages to avoid. Contact a dietitian for more information. Summary  Fiber is a type of carbohydrate. It is found in fruits, vegetables, whole grains, and beans.  There are many health benefits of eating a high-fiber diet, such as preventing constipation, lowering blood cholesterol, helping with weight loss, and reducing your risk of heart disease, diabetes, and certain cancers.  Gradually increase your intake of fiber. Increasing too fast can result in cramping, bloating, and gas. Drink plenty of water while you increase your fiber.  The best sources of fiber include whole fruits and vegetables, whole grains, nuts, seeds, and beans. This information is not intended to replace  advice given to you by your health care provider. Make sure you discuss any questions you have with your health care provider. Document Released: 08/15/2005 Document Revised: 06/19/2017 Document Reviewed: 06/19/2017 Elsevier Patient Education  2020 [employer].  Diverticulosis  Diverticulosis is a condition that develops when small pouches (diverticula) form in the wall of the large intestine (colon). The colon is where water is absorbed and stool is formed. The pouches form when the inside layer of the colon pushes through weak spots in the outer layers of the colon. You may have a few pouches or many of them. What are the causes? The cause of this condition is not known. What increases the risk? The following factors may make you more likely to develop this condition:  Being older than age 40. Your risk for this condition increases with age. Diverticulosis is rare among people younger than age 29. By age 39, many people have it.  Eating a low-fiber diet.  Having frequent constipation.  Being overweight.  Not getting enough exercise.  Smoking.  Taking over-the-counter pain medicines, like aspirin and ibuprofen.  Having a family history of diverticulosis. What are the signs or symptoms? In most people, there are no symptoms of this condition. If you do have symptoms, they may include:  Bloating.  Cramps in the abdomen.  Constipation or diarrhea.  Pain in the lower left side of the abdomen. How is this diagnosed? This condition is most often diagnosed during an exam for other colon problems. Because diverticulosis usually has no symptoms, it often cannot be diagnosed independently. This condition may be diagnosed by:  Using a flexible scope to examine the colon (colonoscopy).  Taking an X-ray of the colon after dye has been put into the colon (barium enema).  Doing a CT scan. How is this treated? You may not need treatment for this condition if you have never developed  an infection related to diverticulosis. If you have had an infection before, treatment may include:  Eating a high-fiber diet. This may include eating more fruits, vegetables, and grains.  Taking a fiber supplement.  Taking a live bacteria supplement (probiotic).  Taking medicine to relax your colon.  Taking antibiotic medicines. Follow these instructions at home:  Drink 6-8 glasses of water or more each day to prevent constipation.  Try not to strain when you have a bowel movement.  If you have had an infection before: ? Eat more fiber as directed by your health care provider or your diet and nutrition specialist (dietitian). ? Take a fiber supplement or probiotic, if your health care provider approves.  Take over-the-counter and prescription medicines only as told by your health care provider.  If you were prescribed an antibiotic, take it as told by your health care provider. Do not stop taking the antibiotic even if you start to feel better.  Keep all follow-up visits as told by your health care provider. This is important. Contact a health care provider if:  You have pain in your abdomen.  You have bloating.  You have cramps.  You have not had a bowel movement in 3 days. Get help right away if:  Your pain gets worse.  Your bloating becomes very bad.  You have a fever or chills, and your symptoms suddenly get worse.  You vomit.  You have bowel movements that are bloody or black.  You have bleeding from your rectum. Summary  Diverticulosis is a condition that develops when small pouches (diverticula) form in the wall of the large intestine (colon).  You may have a few pouches or many of them.  This condition is most often diagnosed during an exam for other colon problems.  If you have had an infection related to diverticulosis, treatment may include increasing the fiber in your diet, taking supplements, or taking medicines. This information is not intended  to replace advice given to you by your health care provider. Make sure you discuss any questions you have with your health care provider. Document Released: 05/12/2004 Document Revised: 07/28/2017 Document Reviewed: 07/04/2016 Elsevier Patient Education  2020 [employer].

## 2019-05-13 ENCOUNTER — Encounter (HOSPITAL_COMMUNITY): Payer: Self-pay | Admitting: Internal Medicine

## 2019-05-23 ENCOUNTER — Other Ambulatory Visit (HOSPITAL_COMMUNITY): Payer: Self-pay | Admitting: Internal Medicine

## 2019-05-23 ENCOUNTER — Other Ambulatory Visit (HOSPITAL_COMMUNITY): Payer: Self-pay | Admitting: Family Medicine

## 2019-05-23 DIAGNOSIS — Z13820 Encounter for screening for osteoporosis: Secondary | ICD-10-CM

## 2019-05-23 DIAGNOSIS — Z1231 Encounter for screening mammogram for malignant neoplasm of breast: Secondary | ICD-10-CM

## 2019-06-03 ENCOUNTER — Other Ambulatory Visit (HOSPITAL_COMMUNITY): Payer: 59

## 2019-06-03 ENCOUNTER — Ambulatory Visit (HOSPITAL_COMMUNITY): Payer: 59

## 2019-06-12 ENCOUNTER — Other Ambulatory Visit (HOSPITAL_COMMUNITY): Payer: 59

## 2019-06-12 ENCOUNTER — Ambulatory Visit (HOSPITAL_COMMUNITY): Payer: 59

## 2020-01-16 NOTE — H&P (Signed)
Surgical History & Physical  Patient Name: Kimberly Love DOB: 1951/05/25  Surgery: Cataract extraction with intraocular lens implant phacoemulsification; Left Eye  Surgeon: Baruch Goldmann MD Surgery Date:  01/31/2020 Pre-Op Date:  01/15/2020  HPI: A 74 Yr. old female patient is referred by Jorja Loa for cataract eval. 1. The patient complains of nighttime light - car headlights, street lamps etc. glare causing poor vision, which began 1 year ago. Both eyes are affected. The episode is gradual. The condition's severity increased since last visit. The complaint is associated with glare. HPI was performed by Baruch Goldmann .  Medical History: Cataracts High Blood Pressure hiatal hernia  Review of Systems Negative Allergic/Immunologic Negative Cardiovascular Negative Constitutional Negative Ear, Nose, Mouth & Throat Negative Endocrine Negative Eyes Negative Gastrointestinal Negative Genitourinary Negative Hemotologic/Lymphatic Negative Integumentary Negative Musculoskeletal Negative Neurological Negative Psychiatry Negative Respiratory  Social   Never Smoked  Medication Escitalopram, Lisinopril, Pravastatin, Omeprazole, Lorazepam,   Sx/Procedures Knee Replacement, Hysterectomy, Tonsilectomy, Gallbladder Sx,   Drug Allergies  Hydrocodone,   History & Physical: Heent:  Cataract, Left eye NECK: supple without bruits LUNGS: lungs clear to auscultation CV: regular rate and rhythm Abdomen: soft and non-tender  Impression & Plan: Assessment: 1.  NUCLEAR SCLEROSIS AGE RELATED; Both Eyes (H25.13)  Plan: 1.  Cataract accounts for the patient's decreased vision. This visual impairment is not correctable with a tolerable change in glasses or contact lenses. Cataract surgery with an implantation of a new lens should significantly improve the visual and functional status of the patient. Discussed all risks, benefits, alternatives, and potential complications. Discussed the  procedures and recovery. Patient desires to have surgery. A-scan ordered and performed today for intra-ocular lens calculations. The surgery will be performed in order to improve vision for driving, reading, and for eye examinations. Recommend phacoemulsification with intra-ocular lens. Left Eye worse - first. Dilates well - shugarcaine by protocol.

## 2020-01-24 NOTE — Patient Instructions (Signed)
Kimberly Love  01/24/2020     @PREFPERIOPPHARMACY @   Your procedure is scheduled on  01/31/2020 .  Report to [facility] at  [facility]  Call this number if you have problems the morning of surgery:  [phone]   Remember:  Do not eat or drink after midnight.                       Take these medicines the morning of surgery with A SIP OF WATER  Lexapro, prilosec,    Do not wear jewelry, make-up or nail polish.  Do not wear lotions, powders, or perfume. Please wear deodorant and brush your teeth.  Do not shave 48 hours prior to surgery.  Men may shave face and neck.  Do not bring valuables to the hospital.  [facility] is not responsible for any belongings or valuables.  Contacts, dentures or bridgework may not be worn into surgery.  Leave your suitcase in the car.  After surgery it may be brought to your room.  For patients admitted to the hospital, discharge time will be determined by your treatment team.  Patients discharged the day of surgery will not be allowed to drive home.   Name and phone number of your driver:   family Special instructions:  DO NOT smoke the morning of your procedure.  Please read over the following fact sheets that you were given. Anesthesia Post-op Instructions and Care and Recovery After Surgery       Cataract Surgery, Care After This sheet gives you information about how to care for yourself after your procedure. Your health care provider may also give you more specific instructions. If you have problems or questions, contact your health care provider. What can I expect after the procedure? After the procedure, it is common to have:  Itching.  Discomfort.  Fluid discharge.  Sensitivity to light and to touch.  Bruising in or around the eye.  Mild blurred vision. Follow these instructions at home: Eye care   Do not touch or rub your eyes.  Protect your eyes as told by your health care provider. You may be told to  wear a protective eye shield or sunglasses.  Do not put a contact lens into the affected eye or eyes until your health care provider approves.  Keep the area around your eye clean and dry: ? Avoid swimming. ? Do not allow water to hit you directly in the face while showering. ? Keep soap and shampoo out of your eyes.  Check your eye every day for signs of infection. Watch for: ? Redness, swelling, or pain. ? Fluid, blood, or pus. ? Warmth. ? A bad smell. ? Vision that is getting worse. ? Sensitivity that is getting worse. Activity  Do not drive for 24 hours if you were given a sedative during your procedure.  Avoid strenuous activities, such as playing contact sports, for as long as told by your health care provider.  Do not drive or use heavy machinery until your health care provider approves.  Do not bend or lift heavy objects. Bending increases pressure in the eye. You can walk, climb stairs, and do light household chores.  Ask your health care provider when you can return to work. If you work in a dusty environment, you may be advised to wear protective eyewear for a period of time. General instructions  Take or apply over-the-counter and prescription medicines only as  told by your health care provider. This includes eye drops.  Keep all follow-up visits as told by your health care provider. This is important. Contact a health care provider if:  You have increased bruising around your eye.  You have pain that is not helped with medicine.  You have a fever.  You have redness, swelling, or pain in your eye.  You have fluid, blood, or pus coming from your incision.  Your vision gets worse.  Your sensitivity to light gets worse. Get help right away if:  You have sudden loss of vision.  You see flashes of light or spots (floaters).  You have severe eye pain.  You develop nausea or vomiting. Summary  After your procedure, it is common to have itching,  discomfort, bruising, fluid discharge, or sensitivity to light.  Follow instructions from your health care provider about caring for your eye after the procedure.  Do not rub your eye after the procedure. You may need to wear eye protection or sunglasses. Do not wear contact lenses. Keep the area around your eye clean and dry.  Avoid activities that require a lot of effort. These include playing sports and lifting heavy objects.  Contact a health care provider if you have increased bruising, pain that does not go away, or a fever. Get help right away if you suddenly lose your vision, see flashes of light or spots, or have severe pain in the eye. This information is not intended to replace advice given to you by your health care provider. Make sure you discuss any questions you have with your health care provider. Document Revised: 06/11/2019 Document Reviewed: 02/12/2018 Elsevier Patient Education  2020 [place] After These instructions provide you with information about caring for yourself after your procedure. Your health care provider may also give you more specific instructions. Your treatment has been planned according to current medical practices, but problems sometimes occur. Call your health care provider if you have any problems or questions after your procedure. What can I expect after the procedure? After your procedure, you may:  Feel sleepy for several hours.  Feel clumsy and have poor balance for several hours.  Feel forgetful about what happened after the procedure.  Have poor judgment for several hours.  Feel nauseous or vomit.  Have a sore throat if you had a breathing tube during the procedure. Follow these instructions at home: For at least 24 hours after the procedure:      Have a responsible adult stay with you. It is important to have someone help care for you until you are awake and alert.  Rest as needed.  Do  not: ? Participate in activities in which you could fall or become injured. ? Drive. ? Use heavy machinery. ? Drink alcohol. ? Take sleeping pills or medicines that cause drowsiness. ? Make important decisions or sign legal documents. ? Take care of children on your own. Eating and drinking  Follow the diet that is recommended by your health care provider.  If you vomit, drink water, juice, or soup when you can drink without vomiting.  Make sure you have little or no nausea before eating solid foods. General instructions  Take over-the-counter and prescription medicines only as told by your health care provider.  If you have sleep apnea, surgery and certain medicines can increase your risk for breathing problems. Follow instructions from your health care provider about wearing your sleep device: ? Anytime you are sleeping, including  during daytime naps. ? While taking prescription pain medicines, sleeping medicines, or medicines that make you drowsy.  If you smoke, do not smoke without supervision.  Keep all follow-up visits as told by your health care provider. This is important. Contact a health care provider if:  You keep feeling nauseous or you keep vomiting.  You feel light-headed.  You develop a rash.  You have a fever. Get help right away if:  You have trouble breathing. Summary  For several hours after your procedure, you may feel sleepy and have poor judgment.  Have a responsible adult stay with you for at least 24 hours or until you are awake and alert. This information is not intended to replace advice given to you by your health care provider. Make sure you discuss any questions you have with your health care provider. Document Revised: 11/13/2017 Document Reviewed: 12/06/2015 Elsevier Patient Education  [place].

## 2020-01-29 ENCOUNTER — Encounter (HOSPITAL_COMMUNITY): Payer: Self-pay

## 2020-01-29 ENCOUNTER — Other Ambulatory Visit: Payer: Self-pay

## 2020-01-29 ENCOUNTER — Other Ambulatory Visit (HOSPITAL_COMMUNITY)
Admission: RE | Admit: 2020-01-29 | Discharge: 2020-01-29 | Disposition: A | Discharge status: Home / Self Care | Payer: 59 | Source: Ambulatory Visit | Attending: Ophthalmology | Admitting: Ophthalmology

## 2020-01-29 ENCOUNTER — Encounter (HOSPITAL_COMMUNITY)
Admission: RE | Admit: 2020-01-29 | Discharge: 2020-01-29 | Disposition: A | Discharge status: Home / Self Care | Payer: 59 | Source: Ambulatory Visit | Attending: Ophthalmology | Admitting: Ophthalmology

## 2020-01-29 DIAGNOSIS — Z01812 Encounter for preprocedural laboratory examination: Secondary | ICD-10-CM | POA: Diagnosis present

## 2020-01-29 DIAGNOSIS — Z20822 Contact with and (suspected) exposure to covid-19: Secondary | ICD-10-CM | POA: Insufficient documentation

## 2020-01-29 LAB — BASIC METABOLIC PANEL
Anion gap: 11 (ref 5–15)
BUN: 16 mg/dL (ref 8–23)
CO2: 28 mmol/L (ref 22–32)
Calcium: 8.8 mg/dL — ABNORMAL LOW (ref 8.9–10.3)
Chloride: 99 mmol/L (ref 98–111)
Creatinine, Ser: 0.51 mg/dL (ref 0.44–1.00)
GFR calc Af Amer: >60 mL/min (ref >60)
GFR calc non Af Amer: >60 mL/min (ref >60)
Glucose, Bld: 157 mg/dL — ABNORMAL HIGH (ref 70–99)
Potassium: 3.8 mmol/L (ref 3.5–5.1)
Sodium: 138 mmol/L (ref 135–145)

## 2020-01-29 LAB — SARS CORONAVIRUS 2 (TAT 6-24 HRS): SARS Coronavirus 2: NEGATIVE

## 2020-01-31 ENCOUNTER — Encounter (HOSPITAL_COMMUNITY): Payer: Self-pay | Admitting: Ophthalmology

## 2020-01-31 ENCOUNTER — Ambulatory Visit (HOSPITAL_COMMUNITY): Payer: 59 | Admitting: Anesthesiology

## 2020-01-31 ENCOUNTER — Encounter (HOSPITAL_COMMUNITY)
Admission: RE | Disposition: A | Discharge status: Home / Self Care | Payer: Self-pay | Source: Home / Self Care | Attending: Ophthalmology

## 2020-01-31 ENCOUNTER — Ambulatory Visit (HOSPITAL_COMMUNITY)
Admission: RE | Admit: 2020-01-31 | Discharge: 2020-01-31 | Disposition: A | Discharge status: Home / Self Care | Payer: 59 | Attending: Ophthalmology | Admitting: Ophthalmology

## 2020-01-31 ENCOUNTER — Other Ambulatory Visit: Payer: Self-pay

## 2020-01-31 DIAGNOSIS — H2512 Age-related nuclear cataract, left eye: Secondary | ICD-10-CM | POA: Insufficient documentation

## 2020-01-31 HISTORY — PX: CATARACT EXTRACTION W/PHACO: SHX586

## 2020-01-31 SURGERY — PHACOEMULSIFICATION, CATARACT, WITH IOL INSERTION
Anesthesia: Monitor Anesthesia Care | Site: Eye | Laterality: Left

## 2020-01-31 MED ORDER — SODIUM HYALURONATE 23 MG/ML IO SOLN
INTRAOCULAR | Status: DC | PRN
  Administered 2020-01-31: 0.6 mL via INTRAOCULAR

## 2020-01-31 MED ORDER — TETRACAINE HCL 0.5 % OP SOLN
1.0000 [drp] | OPHTHALMIC | Status: AC | PRN
  Administered 2020-01-31 (×3): 1 [drp] via OPHTHALMIC

## 2020-01-31 MED ORDER — LIDOCAINE HCL (PF) 1 % IJ SOLN
INTRAOCULAR | Status: DC | PRN
  Administered 2020-01-31: 1 mL via OPHTHALMIC

## 2020-01-31 MED ORDER — NEOMYCIN-POLYMYXIN-DEXAMETH 3.5-10000-0.1 OP SUSP
OPHTHALMIC | Status: DC | PRN
  Administered 2020-01-31: 1 [drp] via OPHTHALMIC

## 2020-01-31 MED ORDER — BSS IO SOLN
INTRAOCULAR | Status: DC | PRN
  Administered 2020-01-31: 15 mL via INTRAOCULAR

## 2020-01-31 MED ORDER — EPINEPHRINE PF 1 MG/ML IJ SOLN
INTRAOCULAR | Status: DC | PRN
  Administered 2020-01-31: 500 mL

## 2020-01-31 MED ORDER — PROVISC 10 MG/ML IO SOLN
INTRAOCULAR | Status: DC | PRN
  Administered 2020-01-31: 0.85 mL via INTRAOCULAR

## 2020-01-31 MED ORDER — LIDOCAINE HCL 3.5 % OP GEL
1.0000 "application " | Freq: Once | OPHTHALMIC | Status: AC
  Administered 2020-01-31: 1 via OPHTHALMIC

## 2020-01-31 MED ORDER — CYCLOPENTOLATE-PHENYLEPHRINE 0.2-1 % OP SOLN
1.0000 [drp] | OPHTHALMIC | Status: AC | PRN
  Administered 2020-01-31 (×3): 1 [drp] via OPHTHALMIC

## 2020-01-31 MED ORDER — POVIDONE-IODINE 5 % OP SOLN
OPHTHALMIC | Status: DC | PRN
  Administered 2020-01-31: 1 via OPHTHALMIC

## 2020-01-31 MED ORDER — PHENYLEPHRINE HCL 2.5 % OP SOLN
1.0000 [drp] | OPHTHALMIC | Status: AC | PRN
  Administered 2020-01-31 (×3): 1 [drp] via OPHTHALMIC

## 2020-01-31 SURGICAL SUPPLY — 12 items
CLOTH BEACON ORANGE TIMEOUT ST (SAFETY) ×3 IMPLANT
EYE SHIELD UNIVERSAL CLEAR (GAUZE/BANDAGES/DRESSINGS) ×3 IMPLANT
GLOVE BIOGEL PI IND STRL 7.0 (GLOVE) ×2 IMPLANT
GLOVE BIOGEL PI INDICATOR 7.0 (GLOVE) ×4
LENS ALC ACRYL/TECN (Ophthalmic Related) ×3 IMPLANT
NEEDLE HYPO 18GX1.5 BLUNT FILL (NEEDLE) ×3 IMPLANT
PAD ARMBOARD 7.5X6 YLW CONV (MISCELLANEOUS) ×3 IMPLANT
SYR TB 1ML LL NO SAFETY (SYRINGE) ×3 IMPLANT
TAPE SURG TRANSPORE 1 IN (GAUZE/BANDAGES/DRESSINGS) ×1 IMPLANT
TAPE SURGICAL TRANSPORE 1 IN (GAUZE/BANDAGES/DRESSINGS) ×3
VISCOELASTIC ADDITIONAL (OPHTHALMIC RELATED) ×3 IMPLANT
WATER STERILE IRR 250ML POUR (IV SOLUTION) ×3 IMPLANT

## 2020-01-31 NOTE — Anesthesia Postprocedure Evaluation (Signed)
Anesthesia Post Note  Patient: Kimberly Love  Procedure(s) Performed: CATARACT EXTRACTION PHACO AND INTRAOCULAR LENS PLACEMENT (IOC) (Left Eye)  Patient location during evaluation: Short Stay Anesthesia Type: MAC Level of consciousness: awake and alert and oriented Pain management: pain level controlled Vital Signs Assessment: post-procedure vital signs reviewed and stable Respiratory status: spontaneous breathing Cardiovascular status: blood pressure returned to baseline and stable Postop Assessment: no apparent nausea or vomiting Anesthetic complications: no     Last Vitals:  Vitals:   01/31/20 0822  BP: (!) 141/52  Pulse: 76  Resp: 18  Temp: (!) 36.2 C  SpO2: 93%    Last Pain:  Vitals:   01/31/20 0822  TempSrc: Oral  PainSc: 0-No pain                 Tien Aispuro

## 2020-01-31 NOTE — Discharge Instructions (Signed)
Please discharge patient when stable, will follow up today with Dr. Marvell Tamer at the [facility] office immediately following discharge.  Leave shield in place until visit.  All paperwork with discharge instructions will be given at the office.  [facility] Address:  [address]  [place], [place] [address]  

## 2020-01-31 NOTE — Interval H&P Note (Signed)
History and Physical Interval Note: The H and P was reviewed and updated. The patient was examined.  No changes were found after exam.  The surgical eye was marked.  01/31/2020 9:24 AM  Kimberly Love  has presented today for surgery, with the diagnosis of Nuclear sclerotic cataract - Left eye.  The various methods of treatment have been discussed with the patient and family. After consideration of risks, benefits and other options for treatment, the patient has consented to  Procedure(s) with comments: CATARACT EXTRACTION PHACO AND INTRAOCULAR LENS PLACEMENT (IOC) (Left) - CDE:  as a surgical intervention.  The patient's history has been reviewed, patient examined, no change in status, stable for surgery.  I have reviewed the patient's chart and labs.  Questions were answered to the patient's satisfaction.     Baruch Goldmann

## 2020-01-31 NOTE — Op Note (Signed)
Date of procedure: 01/31/20  Pre-operative diagnosis: Visually significant age-related nuclear cataract, Left Eye (H25.12)  Post-operative diagnosis: Visually significant age-related nuclear cataract, Left Eye  Procedure: Removal of cataract via phacoemulsification and insertion of intra-ocular lens [name] and [name]  [name] into the capsular bag of the Left Eye  Attending surgeon: Gerda Diss. Jamont Mellin, MD, MA  Anesthesia: MAC, Topical [place]  Complications: None  Estimated Blood Loss: <55m (minimal)  Specimens: None  Implants: As above  Indications:  Visually significant age-related cataract, Left Eye  Procedure:  The patient was seen and identified in the pre-operative area. The operative eye was identified and dilated.  The operative eye was marked.  Topical anesthesia was administered to the operative eye.     The patient was then to the operative suite and placed in the supine position.  A timeout was performed confirming the patient, procedure to be performed, and all other relevant information.   The patient's face was prepped and draped in the usual fashion for intra-ocular surgery.  A lid speculum was placed into the operative eye and the surgical microscope moved into place and focused.  An inferotemporal paracentesis was created using a 20 gauge paracentesis blade.  Shugarcaine was injected into the anterior chamber.  Viscoelastic was injected into the anterior chamber.  A temporal clear-corneal main wound incision was created using a 2.431mmicrokeratome.  A continuous curvilinear capsulorrhexis was initiated using an irrigating cystitome and completed using capsulorrhexis forceps.  Hydrodissection and hydrodeliniation were performed.  Viscoelastic was injected into the anterior chamber.  A phacoemulsification handpiece and a chopper as a second instrument were used to remove the nucleus and epinucleus. The irrigation/aspiration handpiece was used to remove any remaining  cortical material.   The capsular bag was reinflated with viscoelastic, checked, and found to be intact.  The intraocular lens was inserted into the capsular bag.  The irrigation/aspiration handpiece was used to remove any remaining viscoelastic.  The clear corneal wound and paracentesis wounds were then hydrated and checked with Weck-Cels to be watertight.  The lid-speculum and drape was removed, and the patient's face was cleaned with a wet and dry 4x4.  Maxitrol was instilled in the eye before a clear shield was taped over the eye. The patient was taken to the post-operative care unit in good condition, having tolerated the procedure well.  Post-Op Instructions: [facility] and will receive all other orders and instructions.

## 2020-01-31 NOTE — Anesthesia Preprocedure Evaluation (Addendum)
Anesthesia Evaluation  Patient identified by MRN, date of birth, ID band Patient awake    Reviewed: Allergy & Precautions, NPO status , Patient's Chart, lab work & pertinent test results  History of Anesthesia Complications (+) PONV and history of anesthetic complications  Airway Mallampati: III  TM Distance: >3 FB Neck ROM: Full    Dental  (+) Edentulous Upper, Edentulous Lower   Pulmonary Current Smoker and Patient abstained from smoking.,    Pulmonary exam normal breath sounds clear to auscultation       Cardiovascular hypertension, Pt. on medications Normal cardiovascular exam Rhythm:Regular Rate:Normal     Neuro/Psych neg Seizures PSYCHIATRIC DISORDERS Anxiety Depression    GI/Hepatic hiatal hernia, GERD  Medicated and Controlled,  Endo/Other  Morbid obesity  Renal/GU Renal InsufficiencyRenal disease     Musculoskeletal  (+) Arthritis ,   Abdominal   Peds  Hematology   Anesthesia Other Findings   Reproductive/Obstetrics                            Anesthesia Physical Anesthesia Plan  ASA: III  Anesthesia Plan: MAC   Post-op Pain Management:    Induction:   PONV Risk Score and Plan: 2 and Treatment may vary due to age or medical condition  Airway Management Planned: Nasal Cannula and Natural Airway  Additional Equipment:   Intra-op Plan:   Post-operative Plan:   Informed Consent: I have reviewed the patients History and Physical, chart, labs and discussed the procedure including the risks, benefits and alternatives for the proposed anesthesia with the patient or authorized representative who has indicated his/her understanding and acceptance.       Plan Discussed with: CRNA and Surgeon  Anesthesia Plan Comments:         Anesthesia Quick Evaluation

## 2020-01-31 NOTE — Transfer of Care (Signed)
Immediate Anesthesia Transfer of Care Note  Patient: Kimberly Love  Procedure(s) Performed: CATARACT EXTRACTION PHACO AND INTRAOCULAR LENS PLACEMENT (IOC) (Left Eye)  Patient Location: Short Stay  Anesthesia Type:MAC  Level of Consciousness: awake  Airway & Oxygen Therapy: Patient Spontanous Breathing  Post-op Assessment: Report given to RN  Post vital signs: Reviewed  Last Vitals:  Vitals Value Taken Time  BP    Temp    Pulse    Resp    SpO2      Last Pain:  Vitals:   01/31/20 0822  TempSrc: Oral  PainSc: 0-No pain      Patients Stated Pain Goal: 8 (20/35/59 7416)  Complications: No apparent anesthesia complications

## 2020-02-07 NOTE — H&P (Signed)
Surgical History & Physical  Patient Name: Kimberly Love DOB: [birth year]  Surgery: Cataract extraction with intraocular lens implant phacoemulsification; Right Eye  Surgeon: Baruch Goldmann MD Surgery Date:  02/14/2020 Pre-Op Date:  02/06/2020  HPI: A 42 Yr. old female patient 1. 1. The patient is returning after cataract surgery. The left eye is affected. Status post cataract surgery on 01-31-2020: Since the last visit, the affected area feels improvement. The patient's vision is improved. Patient is following medication instructions with 3 in 1 drop TID. VA much brighter and clearer OS. OD dull and hazy. This is negatively affecting the patient's quality of life. Patient ready to proceed with OD due to difference OU. HPI Completed by Dr. Baruch Goldmann  Medical History: Cataracts High Blood Pressure hiatal hernia  Review of Systems Negative Allergic/Immunologic Negative Cardiovascular Negative Constitutional Negative Ear, Nose, Mouth & Throat Negative Endocrine Negative Eyes Negative Gastrointestinal Negative Genitourinary Negative Hemotologic/Lymphatic Negative Integumentary Negative Musculoskeletal Negative Neurological Negative Psychiatry Negative Respiratory  Social   Never Smoked  Medication Prednisolone-gatiflox-bromfenac,  Escitalopram, Lisinopril, Pravastatin, Omeprazole, Lorazepam,   Sx/Procedures Phaco c IOL OS,  Knee Replacement, Hysterectomy, Tonsilectomy, Gallbladder Sx,   Drug Allergies  Hydrocodone,   History & Physical: Heent:  Cataract, Right eye NECK: supple without bruits LUNGS: lungs clear to auscultation CV: regular rate and rhythm Abdomen: soft and non-tender  Impression & Plan: Assessment: 1.  CATARACT EXTRACTION STATUS; Left Eye (Z98.42) 2.  NUCLEAR SCLEROSIS AGE RELATED; , Right Eye (H25.11)  Plan: 1.  1 week after cataract surgery. Doing well with improved vision and normal eye pressure. Call with any problems or  concerns. Continue Gati-Brom-Pred 2x/day for 3 more weeks. 2.  Cataract accounts for the patient's decreased vision. This visual impairment is not correctable with a tolerable change in glasses or contact lenses. Cataract surgery with an implantation of a new lens should significantly improve the visual and functional status of the patient. Discussed all risks, benefits, alternatives, and potential complications. Discussed the procedures and recovery. Patient desires to have surgery. A-scan ordered and performed today for intra-ocular lens calculations. The surgery will be performed in order to improve vision for driving, reading, and for eye examinations. Recommend phacoemulsification with intra-ocular lens. Right Eye. Surgery required to correct imbalance of vision. Dilates well - shugarcaine by protocol.

## 2020-02-10 NOTE — Patient Instructions (Signed)
    Kimberly Love  02/10/2020     @PREFPERIOPPHARMACY @   Your procedure is scheduled on  02/14/2020.  Report to [facility] at  0700  A.M.  Call this number if you have problems the morning of surgery:  [phone]   Remember:  Do not eat or drink after midnight.                         Take these medicines the morning of surgery with A SIP OF WATER lexapro, prilosec.    Do not wear jewelry, make-up or nail polish.  Do not wear lotions, powders, or perfumes. Please wear deodorant and brush your teeth.  Do not shave 48 hours prior to surgery.  Men may shave face and neck.  Do not bring valuables to the hospital.  [facility] is not responsible for any belongings or valuables.  Contacts, dentures or bridgework may not be worn into surgery.  Leave your suitcase in the car.  After surgery it may be brought to your room.  For patients admitted to the hospital, discharge time will be determined by your treatment team.  Patients discharged the day of surgery will not be allowed to drive home.   Name and phone number of your driver:   family Special instructions:  DO NOT smoke the morning of your procedure.  Please read over the following fact sheets that you were given. Anesthesia Post-op Instructions and Care and Recovery After Surgery

## 2020-02-11 ENCOUNTER — Encounter (HOSPITAL_COMMUNITY): Payer: Self-pay

## 2020-02-11 ENCOUNTER — Other Ambulatory Visit: Payer: Self-pay

## 2020-02-11 ENCOUNTER — Encounter (HOSPITAL_COMMUNITY)
Admission: RE | Admit: 2020-02-11 | Discharge: 2020-02-11 | Disposition: A | Discharge status: Home / Self Care | Payer: 59 | Source: Ambulatory Visit | Attending: Ophthalmology | Admitting: Ophthalmology

## 2020-02-12 ENCOUNTER — Other Ambulatory Visit (HOSPITAL_COMMUNITY)
Admission: RE | Admit: 2020-02-12 | Discharge: 2020-02-12 | Disposition: A | Discharge status: Home / Self Care | Payer: 59 | Source: Ambulatory Visit | Attending: Ophthalmology | Admitting: Ophthalmology

## 2020-02-12 DIAGNOSIS — Z01812 Encounter for preprocedural laboratory examination: Secondary | ICD-10-CM | POA: Insufficient documentation

## 2020-02-12 DIAGNOSIS — Z20822 Contact with and (suspected) exposure to covid-19: Secondary | ICD-10-CM | POA: Diagnosis not present

## 2020-02-13 LAB — SARS CORONAVIRUS 2 (TAT 6-24 HRS): SARS Coronavirus 2: NEGATIVE

## 2020-02-14 ENCOUNTER — Encounter (HOSPITAL_COMMUNITY)
Admission: RE | Disposition: A | Discharge status: Home / Self Care | Payer: Self-pay | Source: Home / Self Care | Attending: Ophthalmology

## 2020-02-14 ENCOUNTER — Encounter (HOSPITAL_COMMUNITY): Payer: Self-pay | Admitting: Ophthalmology

## 2020-02-14 ENCOUNTER — Ambulatory Visit (HOSPITAL_COMMUNITY): Payer: 59 | Admitting: Anesthesiology

## 2020-02-14 ENCOUNTER — Ambulatory Visit (HOSPITAL_COMMUNITY)
Admission: RE | Admit: 2020-02-14 | Discharge: 2020-02-14 | Disposition: A | Discharge status: Home / Self Care | Payer: 59 | Attending: Ophthalmology | Admitting: Ophthalmology

## 2020-02-14 DIAGNOSIS — Z79899 Other long term (current) drug therapy: Secondary | ICD-10-CM | POA: Diagnosis not present

## 2020-02-14 DIAGNOSIS — I10 Essential (primary) hypertension: Secondary | ICD-10-CM | POA: Diagnosis not present

## 2020-02-14 DIAGNOSIS — K219 Gastro-esophageal reflux disease without esophagitis: Secondary | ICD-10-CM | POA: Insufficient documentation

## 2020-02-14 DIAGNOSIS — F17200 Nicotine dependence, unspecified, uncomplicated: Secondary | ICD-10-CM | POA: Diagnosis not present

## 2020-02-14 DIAGNOSIS — H2511 Age-related nuclear cataract, right eye: Secondary | ICD-10-CM | POA: Diagnosis present

## 2020-02-14 DIAGNOSIS — F329 Major depressive disorder, single episode, unspecified: Secondary | ICD-10-CM | POA: Insufficient documentation

## 2020-02-14 DIAGNOSIS — F419 Anxiety disorder, unspecified: Secondary | ICD-10-CM | POA: Insufficient documentation

## 2020-02-14 DIAGNOSIS — Z9842 Cataract extraction status, left eye: Secondary | ICD-10-CM | POA: Insufficient documentation

## 2020-02-14 HISTORY — PX: CATARACT EXTRACTION W/PHACO: SHX586

## 2020-02-14 SURGERY — PHACOEMULSIFICATION, CATARACT, WITH IOL INSERTION
Anesthesia: Monitor Anesthesia Care | Site: Eye | Laterality: Right

## 2020-02-14 MED ORDER — POVIDONE-IODINE 5 % OP SOLN
OPHTHALMIC | Status: DC | PRN
  Administered 2020-02-14: 1 via OPHTHALMIC

## 2020-02-14 MED ORDER — TETRACAINE HCL 0.5 % OP SOLN
1.0000 [drp] | OPHTHALMIC | Status: AC | PRN
  Administered 2020-02-14 (×3): 1 [drp] via OPHTHALMIC

## 2020-02-14 MED ORDER — BSS IO SOLN
INTRAOCULAR | Status: DC | PRN
  Administered 2020-02-14: 15 mL via INTRAOCULAR

## 2020-02-14 MED ORDER — LIDOCAINE HCL 3.5 % OP GEL
1.0000 "application " | Freq: Once | OPHTHALMIC | Status: AC
  Administered 2020-02-14: 1 via OPHTHALMIC

## 2020-02-14 MED ORDER — PHENYLEPHRINE HCL 2.5 % OP SOLN
1.0000 [drp] | OPHTHALMIC | Status: AC | PRN
  Administered 2020-02-14 (×3): 1 [drp] via OPHTHALMIC

## 2020-02-14 MED ORDER — LIDOCAINE HCL (PF) 1 % IJ SOLN
INTRAOCULAR | Status: DC | PRN
  Administered 2020-02-14: 1 mL via OPHTHALMIC

## 2020-02-14 MED ORDER — NEOMYCIN-POLYMYXIN-DEXAMETH 3.5-10000-0.1 OP SUSP
OPHTHALMIC | Status: DC | PRN
  Administered 2020-02-14: 1 [drp] via OPHTHALMIC

## 2020-02-14 MED ORDER — PROVISC 10 MG/ML IO SOLN
INTRAOCULAR | Status: DC | PRN
  Administered 2020-02-14: 0.85 mL via INTRAOCULAR

## 2020-02-14 MED ORDER — CYCLOPENTOLATE-PHENYLEPHRINE 0.2-1 % OP SOLN
1.0000 [drp] | OPHTHALMIC | Status: AC | PRN
  Administered 2020-02-14 (×3): 1 [drp] via OPHTHALMIC

## 2020-02-14 MED ORDER — EPINEPHRINE PF 1 MG/ML IJ SOLN
INTRAOCULAR | Status: DC | PRN
  Administered 2020-02-14: 500 mL

## 2020-02-14 MED ORDER — SODIUM HYALURONATE 23 MG/ML IO SOLN
INTRAOCULAR | Status: DC | PRN
  Administered 2020-02-14: 0.6 mL via INTRAOCULAR

## 2020-02-14 SURGICAL SUPPLY — 12 items
CLOTH BEACON ORANGE TIMEOUT ST (SAFETY) ×3 IMPLANT
EYE SHIELD UNIVERSAL CLEAR (GAUZE/BANDAGES/DRESSINGS) ×3 IMPLANT
GLOVE BIOGEL PI IND STRL 7.0 (GLOVE) ×2 IMPLANT
GLOVE BIOGEL PI INDICATOR 7.0 (GLOVE) ×4
LENS ALC ACRYL/TECN (Ophthalmic Related) ×3 IMPLANT
NEEDLE HYPO 18GX1.5 BLUNT FILL (NEEDLE) ×3 IMPLANT
PAD ARMBOARD 7.5X6 YLW CONV (MISCELLANEOUS) ×3 IMPLANT
SYR TB 1ML LL NO SAFETY (SYRINGE) ×3 IMPLANT
TAPE SURG TRANSPORE 1 IN (GAUZE/BANDAGES/DRESSINGS) ×1 IMPLANT
TAPE SURGICAL TRANSPORE 1 IN (GAUZE/BANDAGES/DRESSINGS) ×3
VISCOELASTIC ADDITIONAL (OPHTHALMIC RELATED) ×3 IMPLANT
WATER STERILE IRR 250ML POUR (IV SOLUTION) ×3 IMPLANT

## 2020-02-14 NOTE — Anesthesia Postprocedure Evaluation (Signed)
Anesthesia Post Note  Patient: Kimberly Love  Procedure(s) Performed: CATARACT EXTRACTION PHACO AND INTRAOCULAR LENS PLACEMENT RIGHT EYE (Right Eye)  Patient location during evaluation: Short Stay Anesthesia Type: MAC Level of consciousness: awake and alert Pain management: pain level controlled Vital Signs Assessment: post-procedure vital signs reviewed and stable Respiratory status: spontaneous breathing Cardiovascular status: stable Postop Assessment: no apparent nausea or vomiting Anesthetic complications: no   No complications documented.   Last Vitals:  Vitals:   02/14/20 0805 02/14/20 0914  BP: 131/60   Pulse:  81  Resp: 20 18  Temp: 36.6 C 36.6 C  SpO2: 92%     Last Pain:  Vitals:   02/14/20 0805  TempSrc: Oral  PainSc: 0-No pain                 Everette Rank

## 2020-02-14 NOTE — Interval H&P Note (Signed)
History and Physical Interval Note:  02/14/2020 8:46 AM  Kimberly Love  has presented today for surgery, with the diagnosis of Nuclear sclerotic cataract - Right eye.  The various methods of treatment have been discussed with the patient and family. After consideration of risks, benefits and other options for treatment, the patient has consented to  Procedure(s) with comments: CATARACT EXTRACTION PHACO AND INTRAOCULAR LENS PLACEMENT (IOC) (Right) - right as a surgical intervention.  The patient's history has been reviewed, patient examined, no change in status, stable for surgery.  I have reviewed the patient's chart and labs.  Questions were answered to the patient's satisfaction.     Baruch Goldmann

## 2020-02-14 NOTE — Anesthesia Preprocedure Evaluation (Signed)
Anesthesia Evaluation  Patient identified by MRN, date of birth, ID band Patient awake    Reviewed: Allergy & Precautions, H&P , NPO status , Patient's Chart, lab work & pertinent test results, reviewed documented beta blocker date and time   History of Anesthesia Complications (+) PONV and history of anesthetic complications  Airway Mallampati: III  TM Distance: >3 FB Neck ROM: full    Dental no notable dental hx.    Pulmonary neg pulmonary ROS, Current Smoker and Patient abstained from smoking.,    Pulmonary exam normal breath sounds clear to auscultation       Cardiovascular Exercise Tolerance: Good hypertension, negative cardio ROS   Rhythm:regular Rate:Normal     Neuro/Psych PSYCHIATRIC DISORDERS Anxiety Depression negative neurological ROS     GI/Hepatic Neg liver ROS, hiatal hernia, GERD  Medicated,  Endo/Other  negative endocrine ROS  Renal/GU CRFRenal disease  negative genitourinary   Musculoskeletal   Abdominal   Peds  Hematology negative hematology ROS (+)   Anesthesia Other Findings   Reproductive/Obstetrics negative OB ROS                             Anesthesia Physical Anesthesia Plan  ASA: III  Anesthesia Plan: MAC   Post-op Pain Management:    Induction:   PONV Risk Score and Plan: 3  Airway Management Planned:   Additional Equipment:   Intra-op Plan:   Post-operative Plan:   Informed Consent: I have reviewed the patients History and Physical, chart, labs and discussed the procedure including the risks, benefits and alternatives for the proposed anesthesia with the patient or authorized representative who has indicated his/her understanding and acceptance.     Dental Advisory Given  Plan Discussed with: CRNA  Anesthesia Plan Comments:         Anesthesia Quick Evaluation

## 2020-02-14 NOTE — Op Note (Signed)
Date of procedure: 02/14/20  Pre-operative diagnosis: Visually significant age-related nuclear cataract, Right Eye (H25.11)  Post-operative diagnosis: Visually significant age-related nuclear cataract, Right Eye  Procedure: Removal of cataract via phacoemulsification and insertion of intra-ocular lens Kimberly Love and [place]  [name] into the capsular bag of the Right Eye  Attending surgeon: Gerda Diss. Asaph Serena, MD, MA  Anesthesia: MAC, Topical [place]  Complications: None  Estimated Blood Loss: <60m (minimal)  Specimens: None  Implants: As above  Indications:  Visually significant age-related cataract, Right Eye  Procedure:  The patient was seen and identified in the pre-operative area. The operative eye was identified and dilated.  The operative eye was marked.  Topical anesthesia was administered to the operative eye.     The patient was then to the operative suite and placed in the supine position.  A timeout was performed confirming the patient, procedure to be performed, and all other relevant information.   The patient's face was prepped and draped in the usual fashion for intra-ocular surgery.  A lid speculum was placed into the operative eye and the surgical microscope moved into place and focused.  A superotemporal paracentesis was created using a 20 gauge paracentesis blade.  Shugarcaine was injected into the anterior chamber.  Viscoelastic was injected into the anterior chamber.  A temporal clear-corneal main wound incision was created using a 2.430mmicrokeratome.  A continuous curvilinear capsulorrhexis was initiated using an irrigating cystitome and completed using capsulorrhexis forceps.  Hydrodissection and hydrodeliniation were performed.  Viscoelastic was injected into the anterior chamber.  A phacoemulsification handpiece and a chopper as a second instrument were used to remove the nucleus and epinucleus. The irrigation/aspiration handpiece was used to remove any  remaining cortical material.   The capsular bag was reinflated with viscoelastic, checked, and found to be intact.  The intraocular lens was inserted into the capsular bag.  The irrigation/aspiration handpiece was used to remove any remaining viscoelastic.  The clear corneal wound and paracentesis wounds were then hydrated and checked with Weck-Cels to be watertight.  The lid-speculum and drape was removed, and the patient's face was cleaned with a wet and dry 4x4.  Maxitrol was instilled in the eye before a clear shield was taped over the eye. The patient was taken to the post-operative care unit in good condition, having tolerated the procedure well.  Post-Op Instructions: [facility] and will receive all other orders and instructions.

## 2020-02-14 NOTE — Transfer of Care (Signed)
Immediate Anesthesia Transfer of Care Note  Patient: Kimberly Love  Procedure(s) Performed: CATARACT EXTRACTION PHACO AND INTRAOCULAR LENS PLACEMENT RIGHT EYE (Right Eye)  Patient Location: Short Stay  Anesthesia Type:MAC  Level of Consciousness: awake, alert , oriented and patient cooperative  Airway & Oxygen Therapy: Patient Spontanous Breathing  Post-op Assessment: Report given to RN and Post -op Vital signs reviewed and stable  Post vital signs: Reviewed and stable  Last Vitals:  Vitals Value Taken Time  BP    Temp 36.6 C 02/14/20 0914  Pulse 81 02/14/20 0914  Resp 18 02/14/20 0914  SpO2      Last Pain:  Vitals:   02/14/20 0805  TempSrc: Oral  PainSc: 0-No pain      Patients Stated Pain Goal: 8 (06/27/12 1438)  Complications: No complications documented.

## 2020-02-14 NOTE — Discharge Instructions (Signed)
Please discharge patient when stable, will follow up today with Dr. Zenon Leaf at the [facility] [place] office immediately following discharge.  Leave shield in place until visit.  All paperwork with discharge instructions will be given at the office.  [facility] Address:  [address]  [place], [place] [address]  

## 2020-02-18 ENCOUNTER — Encounter (HOSPITAL_COMMUNITY): Payer: Self-pay | Admitting: Ophthalmology

## 2021-04-28 ENCOUNTER — Other Ambulatory Visit (HOSPITAL_COMMUNITY): Payer: Self-pay | Admitting: Internal Medicine

## 2021-04-28 DIAGNOSIS — Z1231 Encounter for screening mammogram for malignant neoplasm of breast: Secondary | ICD-10-CM

## 2021-06-07 ENCOUNTER — Ambulatory Visit (HOSPITAL_COMMUNITY): Payer: 59

## 2022-07-09 ENCOUNTER — Encounter (INDEPENDENT_AMBULATORY_CARE_PROVIDER_SITE_OTHER): Payer: Self-pay | Admitting: Gastroenterology

## 2023-02-24 ENCOUNTER — Inpatient Hospital Stay (HOSPITAL_COMMUNITY): Payer: 59

## 2023-02-24 ENCOUNTER — Emergency Department (HOSPITAL_COMMUNITY): Payer: 59

## 2023-02-24 ENCOUNTER — Encounter (HOSPITAL_COMMUNITY): Payer: Self-pay | Admitting: Emergency Medicine

## 2023-02-24 ENCOUNTER — Inpatient Hospital Stay (HOSPITAL_COMMUNITY)
Admission: EM | Admit: 2023-02-24 | Discharge: 2023-03-11 | DRG: 871 | Disposition: A | Discharge status: Home Health | Payer: 59 | Attending: Internal Medicine | Admitting: Internal Medicine

## 2023-02-24 ENCOUNTER — Other Ambulatory Visit: Payer: Self-pay

## 2023-02-24 DIAGNOSIS — L8990 Pressure ulcer of unspecified site, unspecified stage: Secondary | ICD-10-CM | POA: Insufficient documentation

## 2023-02-24 DIAGNOSIS — K760 Fatty (change of) liver, not elsewhere classified: Secondary | ICD-10-CM | POA: Diagnosis present

## 2023-02-24 DIAGNOSIS — J440 Chronic obstructive pulmonary disease with acute lower respiratory infection: Secondary | ICD-10-CM | POA: Diagnosis present

## 2023-02-24 DIAGNOSIS — J9601 Acute respiratory failure with hypoxia: Secondary | ICD-10-CM | POA: Diagnosis present

## 2023-02-24 DIAGNOSIS — I421 Obstructive hypertrophic cardiomyopathy: Secondary | ICD-10-CM | POA: Diagnosis present

## 2023-02-24 DIAGNOSIS — F17210 Nicotine dependence, cigarettes, uncomplicated: Secondary | ICD-10-CM | POA: Diagnosis present

## 2023-02-24 DIAGNOSIS — A4189 Other specified sepsis: Principal | ICD-10-CM | POA: Diagnosis present

## 2023-02-24 DIAGNOSIS — E1122 Type 2 diabetes mellitus with diabetic chronic kidney disease: Secondary | ICD-10-CM | POA: Diagnosis present

## 2023-02-24 DIAGNOSIS — I5033 Acute on chronic diastolic (congestive) heart failure: Secondary | ICD-10-CM | POA: Diagnosis not present

## 2023-02-24 DIAGNOSIS — Z96653 Presence of artificial knee joint, bilateral: Secondary | ICD-10-CM | POA: Diagnosis present

## 2023-02-24 DIAGNOSIS — Z885 Allergy status to narcotic agent status: Secondary | ICD-10-CM

## 2023-02-24 DIAGNOSIS — J9602 Acute respiratory failure with hypercapnia: Secondary | ICD-10-CM | POA: Diagnosis present

## 2023-02-24 DIAGNOSIS — J209 Acute bronchitis, unspecified: Secondary | ICD-10-CM | POA: Diagnosis present

## 2023-02-24 DIAGNOSIS — E8729 Other acidosis: Secondary | ICD-10-CM | POA: Diagnosis present

## 2023-02-24 DIAGNOSIS — Z6841 Body Mass Index (BMI) 40.0 and over, adult: Secondary | ICD-10-CM

## 2023-02-24 DIAGNOSIS — I110 Hypertensive heart disease with heart failure: Secondary | ICD-10-CM | POA: Diagnosis present

## 2023-02-24 DIAGNOSIS — Z1152 Encounter for screening for COVID-19: Secondary | ICD-10-CM | POA: Diagnosis not present

## 2023-02-24 DIAGNOSIS — R000 Tachycardia, unspecified: Secondary | ICD-10-CM

## 2023-02-24 DIAGNOSIS — E1165 Type 2 diabetes mellitus with hyperglycemia: Secondary | ICD-10-CM | POA: Diagnosis present

## 2023-02-24 DIAGNOSIS — L89321 Pressure ulcer of left buttock, stage 1: Secondary | ICD-10-CM | POA: Diagnosis present

## 2023-02-24 DIAGNOSIS — J9600 Acute respiratory failure, unspecified whether with hypoxia or hypercapnia: Secondary | ICD-10-CM | POA: Diagnosis present

## 2023-02-24 DIAGNOSIS — E871 Hypo-osmolality and hyponatremia: Secondary | ICD-10-CM | POA: Diagnosis present

## 2023-02-24 DIAGNOSIS — F419 Anxiety disorder, unspecified: Secondary | ICD-10-CM | POA: Diagnosis present

## 2023-02-24 DIAGNOSIS — M130 Polyarthritis, unspecified: Secondary | ICD-10-CM | POA: Diagnosis present

## 2023-02-24 DIAGNOSIS — I480 Paroxysmal atrial fibrillation: Secondary | ICD-10-CM

## 2023-02-24 DIAGNOSIS — J122 Parainfluenza virus pneumonia: Secondary | ICD-10-CM | POA: Diagnosis present

## 2023-02-24 DIAGNOSIS — E876 Hypokalemia: Secondary | ICD-10-CM | POA: Diagnosis not present

## 2023-02-24 DIAGNOSIS — F32A Depression, unspecified: Secondary | ICD-10-CM | POA: Diagnosis present

## 2023-02-24 DIAGNOSIS — T4275XA Adverse effect of unspecified antiepileptic and sedative-hypnotic drugs, initial encounter: Secondary | ICD-10-CM | POA: Diagnosis not present

## 2023-02-24 DIAGNOSIS — Z7901 Long term (current) use of anticoagulants: Secondary | ICD-10-CM

## 2023-02-24 DIAGNOSIS — Z66 Do not resuscitate: Secondary | ICD-10-CM | POA: Diagnosis not present

## 2023-02-24 DIAGNOSIS — B348 Other viral infections of unspecified site: Secondary | ICD-10-CM

## 2023-02-24 DIAGNOSIS — G4733 Obstructive sleep apnea (adult) (pediatric): Secondary | ICD-10-CM | POA: Diagnosis present

## 2023-02-24 DIAGNOSIS — I5031 Acute diastolic (congestive) heart failure: Secondary | ICD-10-CM | POA: Diagnosis not present

## 2023-02-24 DIAGNOSIS — Z7189 Other specified counseling: Secondary | ICD-10-CM

## 2023-02-24 DIAGNOSIS — G40909 Epilepsy, unspecified, not intractable, without status epilepticus: Secondary | ICD-10-CM | POA: Diagnosis present

## 2023-02-24 DIAGNOSIS — T886XXA Anaphylactic reaction due to adverse effect of correct drug or medicament properly administered, initial encounter: Secondary | ICD-10-CM | POA: Diagnosis not present

## 2023-02-24 DIAGNOSIS — A419 Sepsis, unspecified organism: Principal | ICD-10-CM

## 2023-02-24 DIAGNOSIS — N179 Acute kidney failure, unspecified: Secondary | ICD-10-CM | POA: Diagnosis not present

## 2023-02-24 DIAGNOSIS — R0602 Shortness of breath: Secondary | ICD-10-CM | POA: Diagnosis not present

## 2023-02-24 DIAGNOSIS — Z87448 Personal history of other diseases of urinary system: Secondary | ICD-10-CM

## 2023-02-24 DIAGNOSIS — I4510 Unspecified right bundle-branch block: Secondary | ICD-10-CM | POA: Diagnosis present

## 2023-02-24 DIAGNOSIS — I4892 Unspecified atrial flutter: Secondary | ICD-10-CM | POA: Diagnosis present

## 2023-02-24 DIAGNOSIS — R6520 Severe sepsis without septic shock: Secondary | ICD-10-CM | POA: Diagnosis present

## 2023-02-24 DIAGNOSIS — E785 Hyperlipidemia, unspecified: Secondary | ICD-10-CM | POA: Diagnosis present

## 2023-02-24 DIAGNOSIS — L89311 Pressure ulcer of right buttock, stage 1: Secondary | ICD-10-CM | POA: Diagnosis present

## 2023-02-24 DIAGNOSIS — Z7409 Other reduced mobility: Secondary | ICD-10-CM | POA: Diagnosis present

## 2023-02-24 DIAGNOSIS — Z90710 Acquired absence of both cervix and uterus: Secondary | ICD-10-CM

## 2023-02-24 DIAGNOSIS — Z79899 Other long term (current) drug therapy: Secondary | ICD-10-CM

## 2023-02-24 DIAGNOSIS — Z9049 Acquired absence of other specified parts of digestive tract: Secondary | ICD-10-CM

## 2023-02-24 DIAGNOSIS — K219 Gastro-esophageal reflux disease without esophagitis: Secondary | ICD-10-CM | POA: Diagnosis present

## 2023-02-24 LAB — BLOOD GAS, ARTERIAL
Acid-Base Excess: 4 mmol/L — ABNORMAL HIGH (ref 0.0–2.0)
Acid-Base Excess: 3 mmol/L — ABNORMAL HIGH (ref 0.0–2.0)
Bicarbonate: 34.3 mmol/L — ABNORMAL HIGH (ref 20.0–28.0)
Bicarbonate: 33.1 mmol/L — ABNORMAL HIGH (ref 20.0–28.0)
Delivery systems: POSITIVE
Drawn by: 22179
Drawn by: 560031
Expiratory PAP: 8 cmH2O
FIO2: 40 %
Inspiratory PAP: 22 cmH2O
Mode: POSITIVE
O2 Saturation: 93.1 %
O2 Saturation: 95 %
Patient temperature: 39.8
Patient temperature: 37
RATE: 18 resp/min
pCO2 arterial: 93 mmHg (ref 32–48)
pCO2 arterial: 79 mmHg (ref 32–48)
pH, Arterial: 7.19 — CL (ref 7.35–7.45)
pH, Arterial: 7.23 — ABNORMAL LOW (ref 7.35–7.45)
pO2, Arterial: 86 mmHg (ref 83–108)
pO2, Arterial: 73 mmHg — ABNORMAL LOW (ref 83–108)

## 2023-02-24 LAB — COMPREHENSIVE METABOLIC PANEL
ALT: 99 U/L — ABNORMAL HIGH (ref 0–44)
AST: 144 U/L — ABNORMAL HIGH (ref 15–41)
Albumin: 3.9 g/dL (ref 3.5–5.0)
Alkaline Phosphatase: 61 U/L (ref 38–126)
Anion gap: 13 (ref 5–15)
BUN: 22 mg/dL (ref 8–23)
CO2: 26 mmol/L (ref 22–32)
Calcium: 8.5 mg/dL — ABNORMAL LOW (ref 8.9–10.3)
Chloride: 93 mmol/L — ABNORMAL LOW (ref 98–111)
Creatinine, Ser: 0.81 mg/dL (ref 0.44–1.00)
GFR, Estimated: >60 mL/min (ref >60)
Glucose, Bld: 167 mg/dL — ABNORMAL HIGH (ref 70–99)
Potassium: 4.5 mmol/L (ref 3.5–5.1)
Sodium: 132 mmol/L — ABNORMAL LOW (ref 135–145)
Total Bilirubin: 1.6 mg/dL — ABNORMAL HIGH (ref 0.3–1.2)
Total Protein: 8.3 g/dL — ABNORMAL HIGH (ref 6.5–8.1)

## 2023-02-24 LAB — TROPONIN I (HIGH SENSITIVITY)
Troponin I (High Sensitivity): 26 ng/L — ABNORMAL HIGH (ref <18)
Troponin I (High Sensitivity): 27 ng/L — ABNORMAL HIGH (ref <18)

## 2023-02-24 LAB — CBC WITH DIFFERENTIAL/PLATELET
Abs Immature Granulocytes: 0.15 10*3/uL — ABNORMAL HIGH (ref 0.00–0.07)
Basophils Absolute: 0 10*3/uL (ref 0.0–0.1)
Basophils Relative: 0 %
Eosinophils Absolute: 0 10*3/uL (ref 0.0–0.5)
Eosinophils Relative: 0 %
HCT: 45.3 % (ref 36.0–46.0)
Hemoglobin: 14.1 g/dL (ref 12.0–15.0)
Immature Granulocytes: 1 %
Lymphocytes Relative: 3 %
Lymphs Abs: 0.6 10*3/uL — ABNORMAL LOW (ref 0.7–4.0)
MCH: 29.3 pg (ref 26.0–34.0)
MCHC: 31.1 g/dL (ref 30.0–36.0)
MCV: 94.2 fL (ref 80.0–100.0)
Monocytes Absolute: 1.1 10*3/uL — ABNORMAL HIGH (ref 0.1–1.0)
Monocytes Relative: 6 %
Neutro Abs: 15.4 10*3/uL — ABNORMAL HIGH (ref 1.7–7.7)
Neutrophils Relative %: 90 %
Platelets: 218 10*3/uL (ref 150–400)
RBC: 4.81 MIL/uL (ref 3.87–5.11)
RDW: 16.1 % — ABNORMAL HIGH (ref 11.5–15.5)
WBC: 17.2 10*3/uL — ABNORMAL HIGH (ref 4.0–10.5)
nRBC: 0.2 % (ref 0.0–0.2)

## 2023-02-24 LAB — RESPIRATORY PANEL BY PCR

## 2023-02-24 LAB — URINALYSIS, ROUTINE W REFLEX MICROSCOPIC
Bilirubin Urine: NEGATIVE
Glucose, UA: NEGATIVE mg/dL
Ketones, ur: NEGATIVE mg/dL
Nitrite: NEGATIVE
Protein, ur: 100 mg/dL — AB
Specific Gravity, Urine: 1.024 (ref 1.005–1.030)
pH: 5 (ref 5.0–8.0)

## 2023-02-24 LAB — LACTIC ACID, PLASMA
Lactic Acid, Venous: 2.3 mmol/L (ref 0.5–1.9)
Lactic Acid, Venous: 1.7 mmol/L (ref 0.5–1.9)

## 2023-02-24 LAB — RESP PANEL BY RT-PCR (RSV, FLU A&B, COVID)  RVPGX2
Influenza A by PCR: NEGATIVE
Influenza B by PCR: NEGATIVE
Resp Syncytial Virus by PCR: NEGATIVE
SARS Coronavirus 2 by RT PCR: NEGATIVE

## 2023-02-24 LAB — PROCALCITONIN: Procalcitonin: 0.11 ng/mL

## 2023-02-24 LAB — PROTIME-INR
INR: 1.2 (ref 0.8–1.2)
Prothrombin Time: 15.9 seconds — ABNORMAL HIGH (ref 11.4–15.2)

## 2023-02-24 LAB — APTT: aPTT: 34 seconds (ref 24–36)

## 2023-02-24 LAB — BRAIN NATRIURETIC PEPTIDE: B Natriuretic Peptide: 229 pg/mL — ABNORMAL HIGH (ref 0.0–100.0)

## 2023-02-24 LAB — MRSA NEXT GEN BY PCR, NASAL: MRSA by PCR Next Gen: NOT DETECTED

## 2023-02-24 MED ORDER — SODIUM CHLORIDE 0.9 % IV SOLN: 500.0000 mg | INTRAVENOUS | Status: DC

## 2023-02-24 MED ORDER — LACTATED RINGERS IV BOLUS
1000.0000 mL | Freq: Once | INTRAVENOUS | Status: AC
  Administered 2023-02-24: 1000 mL via INTRAVENOUS

## 2023-02-24 MED ORDER — ORAL CARE MOUTH RINSE: 15.0000 mL | OROMUCOSAL | Status: DC

## 2023-02-24 MED ORDER — ACETAMINOPHEN 500 MG PO TABS
1000.0000 mg | ORAL_TABLET | Freq: Once | ORAL | Status: AC
  Administered 2023-02-24: 1000 mg via ORAL
  Filled 2023-02-24: qty 2

## 2023-02-24 MED ORDER — IPRATROPIUM-ALBUTEROL 0.5-2.5 (3) MG/3ML IN SOLN
3.0000 mL | Freq: Four times a day (QID) | RESPIRATORY_TRACT | Status: DC
  Administered 2023-02-24 – 2023-03-01 (×21): 3 mL via RESPIRATORY_TRACT
  Filled 2023-02-24 (×20): qty 3

## 2023-02-24 MED ORDER — POLYETHYLENE GLYCOL 3350 17 G PO PACK: 17.0000 g | PACK | Freq: Every day | ORAL | Status: DC | PRN

## 2023-02-24 MED ORDER — IPRATROPIUM-ALBUTEROL 0.5-2.5 (3) MG/3ML IN SOLN
3.0000 mL | Freq: Four times a day (QID) | RESPIRATORY_TRACT | Status: DC | PRN
  Administered 2023-03-02: 3 mL via RESPIRATORY_TRACT
  Filled 2023-02-24: qty 3

## 2023-02-24 MED ORDER — ENOXAPARIN SODIUM 40 MG/0.4ML IJ SOSY
40.0000 mg | PREFILLED_SYRINGE | INTRAMUSCULAR | Status: DC
  Administered 2023-02-24 – 2023-02-25 (×2): 40 mg via SUBCUTANEOUS
  Filled 2023-02-24 (×2): qty 0.4

## 2023-02-24 MED ORDER — LACTATED RINGERS IV BOLUS
30.0000 mL/kg | Freq: Once | INTRAVENOUS | Status: DC
  Administered 2023-02-24: 3537 mL via INTRAVENOUS

## 2023-02-24 MED ORDER — SODIUM CHLORIDE 0.9 % IV SOLN
1.0000 g | Freq: Once | INTRAVENOUS | Status: AC
  Administered 2023-02-24: 1 g via INTRAVENOUS
  Filled 2023-02-24: qty 10

## 2023-02-24 MED ORDER — CHLORHEXIDINE GLUCONATE CLOTH 2 % EX PADS
6.0000 | MEDICATED_PAD | Freq: Every day | CUTANEOUS | Status: DC
  Administered 2023-02-24 – 2023-02-28 (×5): 6 via TOPICAL

## 2023-02-24 MED ORDER — PNEUMOCOCCAL 20-VAL CONJ VACC 0.5 ML IM SUSY
0.5000 mL | PREFILLED_SYRINGE | INTRAMUSCULAR | Status: DC
  Filled 2023-02-24: qty 0.5

## 2023-02-24 MED ORDER — ORAL CARE MOUTH RINSE: 15.0000 mL | OROMUCOSAL | Status: DC | PRN

## 2023-02-24 MED ORDER — LEVALBUTEROL HCL 1.25 MG/0.5ML IN NEBU
1.2500 mg | INHALATION_SOLUTION | Freq: Once | RESPIRATORY_TRACT | Status: AC
  Administered 2023-02-24: 1.25 mg via RESPIRATORY_TRACT
  Filled 2023-02-24: qty 0.5

## 2023-02-24 MED ORDER — LISINOPRIL 10 MG PO TABS
20.0000 mg | ORAL_TABLET | Freq: Every day | ORAL | Status: DC
  Administered 2023-02-25: 20 mg via ORAL
  Filled 2023-02-24: qty 2

## 2023-02-24 MED ORDER — PANTOPRAZOLE SODIUM 40 MG PO TBEC
40.0000 mg | DELAYED_RELEASE_TABLET | Freq: Every day | ORAL | Status: DC
  Administered 2023-02-25: 40 mg via ORAL
  Filled 2023-02-24: qty 1

## 2023-02-24 MED ORDER — SODIUM CHLORIDE 0.9 % IV SOLN
500.0000 mg | Freq: Once | INTRAVENOUS | Status: AC
  Administered 2023-02-24: 500 mg via INTRAVENOUS
  Filled 2023-02-24: qty 5

## 2023-02-24 MED ORDER — METHYLPREDNISOLONE SODIUM SUCC 125 MG IJ SOLR
125.0000 mg | Freq: Once | INTRAMUSCULAR | Status: AC
  Administered 2023-02-24: 125 mg via INTRAVENOUS
  Filled 2023-02-24: qty 2

## 2023-02-24 MED ORDER — DOCUSATE SODIUM 100 MG PO CAPS: 100.0000 mg | ORAL_CAPSULE | Freq: Two times a day (BID) | ORAL | Status: DC | PRN

## 2023-02-24 MED ORDER — LACTATED RINGERS IV SOLN: INTRAVENOUS | Status: AC

## 2023-02-24 MED ORDER — SODIUM CHLORIDE 0.9 % IV SOLN: 2.0000 g | INTRAVENOUS | Status: DC

## 2023-02-24 MED ORDER — GUAIFENESIN ER 600 MG PO TB12
600.0000 mg | ORAL_TABLET | Freq: Two times a day (BID) | ORAL | Status: DC | PRN
  Administered 2023-02-24: 600 mg via ORAL
  Filled 2023-02-24: qty 1

## 2023-02-24 MED ORDER — HYDROCHLOROTHIAZIDE 25 MG PO TABS
25.0000 mg | ORAL_TABLET | Freq: Every day | ORAL | Status: DC
  Administered 2023-02-25: 25 mg via ORAL
  Filled 2023-02-24: qty 1

## 2023-02-24 MED ORDER — MELATONIN 3 MG PO TABS: 3.0000 mg | ORAL_TABLET | Freq: Every evening | ORAL | Status: DC | PRN

## 2023-02-24 MED ORDER — IOHEXOL 350 MG/ML SOLN
100.0000 mL | Freq: Once | INTRAVENOUS | Status: AC | PRN
  Administered 2023-02-24: 100 mL via INTRAVENOUS

## 2023-02-24 NOTE — ED Notes (Signed)
Pts rectal temp currently 98.5 post tylenol administration

## 2023-02-24 NOTE — Progress Notes (Signed)
   02/24/23 2003  BiPAP/CPAP/SIPAP  Reason BIPAP/CPAP not in use Other(comment) (Order for QHS pt not ready to wear it yet. No resp distress. will check again when pt ready to place her on bipap. machine remained bedside.)

## 2023-02-24 NOTE — Sepsis Progress Note (Signed)
Eljnk will follow per sepsis protocol.

## 2023-02-24 NOTE — ED Triage Notes (Signed)
Pt arrived via [facility] from home c/o SOB x 4 days. Pt denies Hx of CHF, COPD, asthma. Per EMS initial SpO2 on RA was in the 70s, pt was placed on 15 L via non rebreather and SpO2 came back up to 94-96% and pt noted relief. Pt is also tachycardic at 131, pt is alert at this time and respiratory called

## 2023-02-24 NOTE — ED Notes (Signed)
Both sets of blood cultures started before any antibiotic administration. MD and PA-C made aware of rectal temp of 103.7

## 2023-02-24 NOTE — ED Notes (Signed)
RT called to place pt on Bipap 

## 2023-02-24 NOTE — Progress Notes (Signed)
   02/24/23 2259  BiPAP/CPAP/SIPAP  BiPAP/CPAP/SIPAP Pt Type Adult  BiPAP/CPAP/SIPAP V60  Mask Type Full face mask  Mask Size Medium  Set Rate 18 breaths/min  Respiratory Rate 19 breaths/min  IPAP 22 cmH20  EPAP 8 cmH2O  FiO2 (%) 40 %  Minute Ventilation 7  Leak 0  Peak Inspiratory Pressure (PIP) 23  Tidal Volume (Vt) 370  Patient Home Equipment No  Auto Titrate No  Press High Alarm 35 cmH2O  Press Low Alarm 5 cmH2O  Oxygen Percent 40 %  BiPAP/CPAP /SiPAP Vitals  Pulse Rate (!) 110  Resp 18  BP 134/78  SpO2 94 %  Bilateral Breath Sounds Diminished  MEWS Score/Color  MEWS Score 1  MEWS Score Color Green

## 2023-02-24 NOTE — ED Notes (Signed)
PA-C at bedside, aware of SpO2 in the high 80s, Bipap order will be placed per PA-C

## 2023-02-24 NOTE — ED Notes (Signed)
MD at bedside. 

## 2023-02-24 NOTE — H&P (Addendum)
NAME:  Kimberly Love, MRN:  [id number], DOB:  [birth year], LOS: 0 ADMISSION DATE:  02/24/2023, CONSULTATION DATE:  02/24/23 REFERRING MD:  Estell Harpin - TRH, CHIEF COMPLAINT:  SOB   History of Present Illness:  72 yo PMH mood disorder HTN,  GERD, tobacco use, diastolic HF (last echo 2013) s/p cataract surgery  presented to [facility] ED 6/28 w SOB. Sounds like ongoing x 4d. Associated URI sx, cough, DOE. No fever chills. Hypoxic on EMS arrival w sats 70s. CTA chest with no PE, no pleural effusion, mild cardiomegaly, BLL bronchial wall thickening, scatred ASD RUL   She required BiPAP in ED and was accepted in admission to ICU   Pertinent  Medical History   HTN Cataracts Tobacco use ?GERD   Significant Hospital Events: Including procedures, antibiotic start and stop dates in addition to other pertinent events   6/28 [facility] ED to [facility] ICU   Interim History / Subjective:  Arrive to [facility] ICU   Objective   Blood pressure 116/84, pulse (!) 117, temperature 98.5 F (36.9 C), temperature source Rectal, resp. rate (!) 22, height 5\' 6"  (1.676 m), weight 117.9 kg, SpO2 93 %.    FiO2 (%):  [45 %-50 %] 45 %  No intake or output data in the 24 hours ending 02/24/23 1659 Filed Weights   02/24/23 1002  Weight: 117.9 kg    Examination: General: Obese chronically ill F NAD HENT: BiPAP in place w slight leak, edentulous   Lungs: RUL rhonchi, symmetrical chest expansion  Cardiovascular: irir cap refill < 3 sec  Abdomen: obese soft ndnt  Extremities: BLE edema. Healed L knee surgery scar.  Skin: Chronic BLE brown bark-like changes  Neuro: AAOx4 following commands GU: purewick   Resolved Hospital Problem list     Assessment & Plan:   Acute hypoxic and hypercarbic respiratory failure CAP, likely bronchitis  -BNP slightly elevated  P -repeat ABG -sent PCT -empiric abx cap coverage -nebs  -RVP, strep pneumo, legionella, sputum cx if can produce    -Hope to come off of BiPAP for a break on HFNC,  and then at q HS and PRN BiPAP   Sepsis, likely due to CAP vs UTI P -rocephin, azithro as above  -bcx and ucx pending   Afib -new P -EKG -pending that, possible hep gtt   Diastolic HF HTN -last echo in 4098 with g1dd P -ECHO  -restart home lisinopril-hydrochlorothiazide   Lactic acidosis, improved  -tx underlying cause   Hyponatremia,mild  -follow BMP   Elevated LFTs -trend   Hx HTN  Hx GERD   Hx depression/anxiety -restart home lexapro when taking POs   DNR  Status -pt AAOx4-- endorses DNR/I code status which she has previously discussed with her Husband robert  P -DNR entered  Best Practice (right click and "Reselect all SmartList Selections" daily)   Diet/type: NPO DVT prophylaxis: LMWH GI prophylaxis: PPI Lines: N/A Foley:  N/A Code Status:  DNR Last date of multidisciplinary goals of care discussion [--]  Labs   CBC: Recent Labs  Lab 02/24/23 1103  WBC 17.2*  NEUTROABS 15.4*  HGB 14.1  HCT 45.3  MCV 94.2  PLT 218    Basic Metabolic Panel: Recent Labs  Lab 02/24/23 1103  NA 132*  K 4.5  CL 93*  CO2 26  GLUCOSE 167*  BUN 22  CREATININE 0.81  CALCIUM 8.5*   GFR: Estimated Creatinine Clearance: 82 mL/min (by C-G formula based on SCr of 0.81 mg/dL). Recent Labs  Lab 02/24/23 1103 02/24/23 1118 02/24/23 1317  WBC 17.2*  --   --   LATICACIDVEN  --  2.3* 1.7    Liver Function Tests: Recent Labs  Lab 02/24/23 1103  AST 144*  ALT 99*  ALKPHOS 61  BILITOT 1.6*  PROT 8.3*  ALBUMIN 3.9   No results for input(s): "LIPASE", "AMYLASE" in the last 168 hours. No results for input(s): "AMMONIA" in the last 168 hours.  ABG    Component Value Date/Time   PHART 7.19 (LL) 02/24/2023 1220   PCO2ART 93 (HH) 02/24/2023 1220   PO2ART 86 02/24/2023 1220   HCO3 34.3 (H) 02/24/2023 1220   TCO2 30 12/28/2011 2134   ACIDBASEDEF 0.1 12/28/2011 1652   O2SAT 93.1 02/24/2023 1220     Coagulation Profile: Recent Labs  Lab  02/24/23 1118  INR 1.2    Cardiac Enzymes: No results for input(s): "CKTOTAL", "CKMB", "CKMBINDEX", "TROPONINI" in the last 168 hours.  HbA1C: No results found for: "HGBA1C"  CBG: No results for input(s): "GLUCAP" in the last 168 hours.  Review of Systems:   Review of Systems  Constitutional: Negative.   HENT:  Positive for congestion.   Eyes: Negative.   Respiratory:  Positive for cough, sputum production and shortness of breath.   Cardiovascular: Negative.   Gastrointestinal: Negative.   Genitourinary: Negative.   Musculoskeletal: Negative.   Skin: Negative.   Neurological: Negative.   Psychiatric/Behavioral: Negative.       Past Medical History:  She,  has a past medical history of Anxiety, Arthritis, Chronic kidney disease, Complication of anesthesia, Depression, GERD (gastroesophageal reflux disease), Hiatal hernia, Hypertension, PONV (postoperative nausea and vomiting), and Seizures (HCC).   Surgical History:   Past Surgical History:  Procedure Laterality Date   ABDOMINAL HYSTERECTOMY     partial    BREAST SURGERY     bio  2012   neg   CATARACT EXTRACTION W/PHACO Left 01/31/2020   Procedure: CATARACT EXTRACTION PHACO AND INTRAOCULAR LENS PLACEMENT (IOC);  Surgeon: Fabio Pierce, MD;  Location: [facility];  Service: Ophthalmology;  Laterality: Left;  CDE: 25.85   CATARACT EXTRACTION W/PHACO Right 02/14/2020   Procedure: CATARACT EXTRACTION PHACO AND INTRAOCULAR LENS PLACEMENT RIGHT EYE;  Surgeon: Fabio Pierce, MD;  Location: [facility];  Service: Ophthalmology;  Laterality: Right;  CDE: 15.45   CHOLECYSTECTOMY     COLONOSCOPY N/A 05/31/2018   Procedure: COLONOSCOPY;  Surgeon: Malissa Hippo, MD;  Location: [facility];  Service: Endoscopy;  Laterality: N/A;  830   COLONOSCOPY N/A 05/09/2019   Procedure: COLONOSCOPY;  Surgeon: Malissa Hippo, MD;  Location: [facility];  Service: Endoscopy;  Laterality: N/A;  930   ESOPHAGOGASTRODUODENOSCOPY N/A 02/05/2016    Procedure: ESOPHAGOGASTRODUODENOSCOPY (EGD);  Surgeon: Malissa Hippo, MD;  Location: [facility];  Service: Endoscopy;  Laterality: N/A;  12:25 - moved to 10:40 - Ann notified pt   orthorscopy     rt knee   POLYPECTOMY  05/31/2018   Procedure: POLYPECTOMY;  Surgeon: Malissa Hippo, MD;  Location: [facility];  Service: Endoscopy;;  colon   POLYPECTOMY  05/09/2019   Procedure: POLYPECTOMY;  Surgeon: Malissa Hippo, MD;  Location: [facility];  Service: Endoscopy;;   TONSILLECTOMY     TOTAL KNEE ARTHROPLASTY  12/26/2011   Procedure: TOTAL KNEE ARTHROPLASTY;  Surgeon: Raymon Mutton, MD;  Location: [facility];  Service: Orthopedics;  Laterality: Right;  right total knee arthroplasty   TOTAL KNEE ARTHROPLASTY  07/02/2012  left knee   TOTAL KNEE ARTHROPLASTY  07/02/2012   Procedure: TOTAL KNEE ARTHROPLASTY;  Surgeon: Raymon Mutton, MD;  Location: [facility] OR;  Service: Orthopedics;  Laterality: Left;  left total knee replacement   TUBAL LIGATION       Social History:   reports that she has been smoking cigarettes. She has a 48.00 pack-year smoking history. She has never used smokeless tobacco. She reports that she does not drink alcohol and does not use drugs.   Family History:  Her family history is negative for Colon cancer.   Allergies Allergies  Allergen Reactions   Oxycodone Anaphylaxis    Patient ended up in icu     Home Medications  Prior to Admission medications   Medication Sig Start Date End Date Taking? Authorizing Provider  escitalopram (LEXAPRO) 20 MG tablet Take 20 mg by mouth daily. 03/29/19  Yes [provider]  lisinopril-hydrochlorothiazide (PRINZIDE,ZESTORETIC) 20-25 MG per tablet Take 1 tablet by mouth daily.    Yes [provider]  pravastatin (PRAVACHOL) 10 MG tablet Take 10 mg by mouth daily.   Yes [provider]     Critical care time: 45 min     CRITICAL CARE Performed by: Lanier Clam   Total critical care time: 45  minutes  Critical care time was exclusive of separately billable procedures and treating other patients. Critical care was necessary to treat or prevent imminent or life-threatening deterioration.  Critical care was time spent personally by me on the following activities: development of treatment plan with patient and/or surrogate as well as nursing, discussions with consultants, evaluation of patient's response to treatment, examination of patient, obtaining history from patient or surrogate, ordering and performing treatments and interventions, ordering and review of laboratory studies, ordering and review of radiographic studies, pulse oximetry and re-evaluation of patient's condition.   Tessie Fass MSN, AGACNP-BC [facility] Pulmonary/Critical Care Medicine Amion for pager  02/24/2023, 5:33 PM

## 2023-02-24 NOTE — ED Provider Notes (Signed)
[place] EMERGENCY DEPARTMENT AT [facility] Provider Note   CSN: [id number] Arrival date & time: 02/24/23  7829     History  Chief Complaint  Patient presents with   Shortness of Breath    Kimberly Love is a 72 y.o. female with a history including hypertension, seizure disorder, GERD, chronic kidney disease presenting with a 4-day history of upper respiratory symptoms.  She states her husband recently improved from a URI and 4 days ago she developed cough which has been productive of a clear sputum, over the course of the 4 days she has been progressively having increased shortness of breath, worse with exertion, does not sleep supine, stating sleeps propped up secondary to a hiatal hernia so orthopnea unclear.  She denies fevers or chills but does endorse increased escalation of cough episodes with worsening shortness of breath.  She denies chest pain, nausea or vomiting, abdominal pain.  She does have a history of lower extremity edema which is not worsened this week. Prior to arrival she had a SpO2 in the 70s, she was placed on 15 L nonrebreather during transport and her oxygen improved to greater than 95%.  The history is provided by the patient and the spouse.       Home Medications Prior to Admission medications   Medication Sig Start Date End Date Taking? Authorizing Provider  escitalopram (LEXAPRO) 20 MG tablet Take 20 mg by mouth daily. 03/29/19   [provider]  lisinopril-hydrochlorothiazide (PRINZIDE,ZESTORETIC) 20-25 MG per tablet Take 1 tablet by mouth daily.     [provider]  LORazepam (ATIVAN) 0.5 MG tablet Take 0.5 mg by mouth at bedtime as needed for sleep. 11/26/18   [provider]  omeprazole (PRILOSEC) 20 MG capsule Take 20 mg by mouth daily as needed (acid reflux/indigestion.).     [provider]  pravastatin (PRAVACHOL) 10 MG tablet Take 10 mg by mouth daily.    [provider]      Allergies     Oxycodone    Review of Systems   Review of Systems  Constitutional:  Negative for chills and fever.  HENT:  Positive for congestion. Negative for sore throat.   Eyes: Negative.   Respiratory:  Positive for cough and shortness of breath. Negative for chest tightness.   Cardiovascular:  Positive for leg swelling. Negative for chest pain and palpitations.  Gastrointestinal:  Negative for abdominal pain, nausea and vomiting.  Genitourinary: Negative.   Musculoskeletal:  Negative for arthralgias, joint swelling and neck pain.  Skin: Negative.  Negative for rash and wound.  Neurological:  Negative for dizziness, weakness, light-headedness, numbness and headaches.  Psychiatric/Behavioral: Negative.    All other systems reviewed and are negative.   Physical Exam Updated Vital Signs BP 112/61   Pulse (!) 120   Temp (!) 103.7 F (39.8 C) (Rectal)   Resp (!) 21   Ht 5\' 6"  (1.676 m)   Wt 117.9 kg   SpO2 91%   BMI 41.95 kg/m  Physical Exam Vitals and nursing note reviewed.  Constitutional:      Appearance: She is well-developed.  HENT:     Head: Normocephalic and atraumatic.  Eyes:     Conjunctiva/sclera: Conjunctivae normal.  Cardiovascular:     Rate and Rhythm: Regular rhythm. Tachycardia present.     Heart sounds: Normal heart sounds.  Pulmonary:     Effort: Pulmonary effort is normal.     Breath sounds: Decreased breath sounds, wheezing and rhonchi  present.     Comments: Decreased aeration bilaterally with bilateral rhonchi and wheezing present throughout mid to lower lung fields. Abdominal:     General: Bowel sounds are normal.     Palpations: Abdomen is soft.     Tenderness: There is no abdominal tenderness.  Musculoskeletal:        General: Normal range of motion.     Cervical back: Normal range of motion.  Skin:    General: Skin is warm and dry.  Neurological:     Mental Status: She is alert.     ED Results / Procedures / Treatments   Labs (all labs ordered  are listed, but only abnormal results are displayed) Labs Reviewed  CBC WITH DIFFERENTIAL/PLATELET - Abnormal; Notable for the following components:      Result Value   WBC 17.2 (*)    RDW 16.1 (*)    Neutro Abs 15.4 (*)    Lymphs Abs 0.6 (*)    Monocytes Absolute 1.1 (*)    Abs Immature Granulocytes 0.15 (*)    All other components within normal limits  COMPREHENSIVE METABOLIC PANEL - Abnormal; Notable for the following components:   Sodium 132 (*)    Chloride 93 (*)    Glucose, Bld 167 (*)    Calcium 8.5 (*)    Total Protein 8.3 (*)    AST 144 (*)    ALT 99 (*)    Total Bilirubin 1.6 (*)    All other components within normal limits  BRAIN NATRIURETIC PEPTIDE - Abnormal; Notable for the following components:   B Natriuretic Peptide 229.0 (*)    All other components within normal limits  LACTIC ACID, PLASMA - Abnormal; Notable for the following components:   Lactic Acid, Venous 2.3 (*)    All other components within normal limits  PROTIME-INR - Abnormal; Notable for the following components:   Prothrombin Time 15.9 (*)    All other components within normal limits  URINALYSIS, ROUTINE W REFLEX MICROSCOPIC - Abnormal; Notable for the following components:   Color, Urine AMBER (*)    APPearance CLOUDY (*)    Hgb urine dipstick SMALL (*)    Protein, ur 100 (*)    Leukocytes,Ua MODERATE (*)    Bacteria, UA RARE (*)    All other components within normal limits  BLOOD GAS, ARTERIAL - Abnormal; Notable for the following components:   pH, Arterial 7.19 (*)    pCO2 arterial 93 (*)    Bicarbonate 34.3 (*)    Acid-Base Excess 4.0 (*)    All other components within normal limits  TROPONIN I (HIGH SENSITIVITY) - Abnormal; Notable for the following components:   Troponin I (High Sensitivity) 26 (*)    All other components within normal limits  TROPONIN I (HIGH SENSITIVITY) - Abnormal; Notable for the following components:   Troponin I (High Sensitivity) 27 (*)    All other  components within normal limits  RESP PANEL BY RT-PCR (RSV, FLU A&B, COVID)  RVPGX2  CULTURE, BLOOD (ROUTINE X 2)  CULTURE, BLOOD (ROUTINE X 2)  APTT  LACTIC ACID, PLASMA    EKG EKG Interpretation Date/Time:  Friday February 24 2023 10:01:43 EDT Ventricular Rate:  126 PR Interval:    QRS Duration:  139 QT Interval:  325 QTC Calculation: 471 R Axis:   -44  Text Interpretation: Atrial fibrillation RBBB and LAFB Confirmed by Bethann Berkshire ([id number]) on 02/24/2023 10:05:24 AM  Radiology DG Chest Port 1 View  Result Date: 02/24/2023  CLINICAL DATA:  Shortness of breath EXAM: PORTABLE CHEST 1 VIEW COMPARISON:  04/10/2012 x-ray and CT angiogram FINDINGS: Enlarged cardiopericardial silhouette. No pneumothorax or effusion. No consolidation. Increasing interstitial changes with some questionable Kerley B-lines on the right. Trace edema. Overlapping cardiac leads. Degenerative changes of the spine IMPRESSION: Enlarged heart with increasing interstitial changes with possible Kerley B-lines. Possible mild interstitial edema. Recommend follow-up Electronically Signed   By: Karen Kays M.D.   On: 02/24/2023 10:23    Procedures Procedures    Medications Ordered in ED Medications  lactated ringers infusion (0 mLs Intravenous Hold 02/24/23 1028)  levalbuterol (XOPENEX) nebulizer solution 1.25 mg (1.25 mg Nebulization Given 02/24/23 1036)  methylPREDNISolone sodium succinate (SOLU-MEDROL) 125 mg/2 mL injection 125 mg (125 mg Intravenous Given 02/24/23 1111)  cefTRIAXone (ROCEPHIN) 1 g in sodium chloride 0.9 % 100 mL IVPB (0 g Intravenous Stopped 02/24/23 1140)  azithromycin (ZITHROMAX) 500 mg in sodium chloride 0.9 % 250 mL IVPB (0 mg Intravenous Stopped 02/24/23 1235)  acetaminophen (TYLENOL) tablet 1,000 mg (1,000 mg Oral Given 02/24/23 1126)  lactated ringers bolus 1,000 mL (1,000 mLs Intravenous Bolus 02/24/23 1113)  lactated ringers bolus 1,000 mL (1,000 mLs Intravenous Bolus 02/24/23 1307)    ED Course/  Medical Decision Making/ A&P Clinical Course as of 02/24/23 1317  Fri Feb 24, 2023  1028 Rectal temp 103.7.  code sepsis implemented.  IV fluids and abx ordered,  lactate and blood cx already ordered. [JI]    Clinical Course User Index [JI] Burgess Amor, PA-C                             Medical Decision Making Patient with a history significant for hypertension, chronic kidney disease presenting with febrile illness along with shortness of breath, extremely hypoxic upon presentation.  Differential diagnosis including viral versus bacterial respiratory infection, given the fever, other shortness of breath etiologies less likely but also could possibly represent PE, she denies chest pain, doubt this is ACS, she is not have a history of CHF, but could be new onset.  She initially presents with tachycardia, her initial EKG was questioning possible irregular rhythm also with suggestion of new onset A-fib.  However repeat EKG revealed a regular pattern, still tachycardic.  Amount and/or Complexity of Data Reviewed Labs: ordered.    Details: Labs strongly reflecting sepsis, she has a initial lactate of 2.3, a white count of 17.2.  Blood cultures are pending at this time.  She has a modestly elevated BNP at 229 her c-Met is also significant for a mild hyponatremia with a sodium of 132, her kidney function is normal.  She does have a bump in her LFTs with her AST at 144, ALT of 99, her total bilirubin is 1.6.  Respiratory panel is negative urinalysis 11-20 WBCs, rare bacteria, urine culture ordered. Radiology: ordered.    Details: Her initial chest x-ray suggests possible curly B-lines and mild interstitial edema.  Given her high fever including SIRS criteria, she is being given gentle IV fluids, lactated Ringer's, 1 L initially, to avoid fluid overload, a second liter was ordered after her lactic acid came back elevated. ECG/medicine tests: ordered.    Details: Initial EKG suggesting A-fib, although  subtle, right bundle branch block which is new.  Of note she denies chest pain, her delta troponins have been reassuring.  Repeat EKG no A-fib at this time but remains with sinus tachycardia. Discussion of management or test interpretation  with external provider(s): Patient was discussed with Dr. Katrinka Blazing of critical care, he accepts patient for admission/transfer to [facility] ICU.  He has requested a CT angio chest in the interim which has been ordered.  Risk OTC drugs. Prescription drug management. Decision regarding hospitalization.   CRITICAL CARE Performed by: Burgess Amor Total critical care time: 45 minutes Critical care time was exclusive of separately billable procedures and treating other patients. Critical care was necessary to treat or prevent imminent or life-threatening deterioration. Critical care was time spent personally by me on the following activities: development of treatment plan with patient and/or surrogate as well as nursing, discussions with consultants, evaluation of patient's response to treatment, examination of patient, obtaining history from patient or surrogate, ordering and performing treatments and interventions, ordering and review of laboratory studies, ordering and review of radiographic studies, pulse oximetry and re-evaluation of patient's condition.         Final Clinical Impression(s) / ED Diagnoses Final diagnoses:  Sepsis with acute hypercapnic respiratory failure without septic shock, due to unspecified organism [facility])  Sinus tachycardia    Rx / DC Orders ED Discharge Orders     None         Victoriano Lain 02/24/23 1445    Bethann Berkshire, MD 02/27/23 1112

## 2023-02-24 NOTE — ED Notes (Signed)
[facility] at bedside for transport. 

## 2023-02-24 NOTE — ED Notes (Signed)
Patient transported to CT, RT called to assist with transport

## 2023-02-24 NOTE — ED Notes (Signed)
Pts's bilateral legs/feet appear scaled with brown patches scattered.

## 2023-02-24 NOTE — ED Notes (Signed)
[  T placed on Bipap at this time, RT at bedside

## 2023-02-24 NOTE — Progress Notes (Signed)
Patient transported to and from CT without incident.

## 2023-02-25 ENCOUNTER — Inpatient Hospital Stay (HOSPITAL_COMMUNITY): Payer: 59

## 2023-02-25 DIAGNOSIS — I5031 Acute diastolic (congestive) heart failure: Secondary | ICD-10-CM

## 2023-02-25 DIAGNOSIS — J9602 Acute respiratory failure with hypercapnia: Secondary | ICD-10-CM | POA: Diagnosis not present

## 2023-02-25 LAB — URINALYSIS, W/ REFLEX TO CULTURE (INFECTION SUSPECTED)
Bilirubin Urine: NEGATIVE
Glucose, UA: NEGATIVE mg/dL
Ketones, ur: NEGATIVE mg/dL
Leukocytes,Ua: NEGATIVE
Nitrite: NEGATIVE
Protein, ur: NEGATIVE mg/dL
Specific Gravity, Urine: 1.013 (ref 1.005–1.030)
pH: 5 (ref 5.0–8.0)

## 2023-02-25 LAB — BLOOD GAS, ARTERIAL
Acid-Base Excess: 4.9 mmol/L — ABNORMAL HIGH (ref 0.0–2.0)
Acid-Base Excess: 4.4 mmol/L — ABNORMAL HIGH (ref 0.0–2.0)
Bicarbonate: 36.7 mmol/L — ABNORMAL HIGH (ref 20.0–28.0)
Bicarbonate: 33 mmol/L — ABNORMAL HIGH (ref 20.0–28.0)
Delivery systems: POSITIVE
Drawn by: 560031
Drawn by: 560031
FIO2: 40 %
FIO2: 50 %
MECHVT: 480 mL
Mode: POSITIVE
O2 Saturation: 94 %
O2 Saturation: 98.8 %
PEEP: 5 cmH2O
Patient temperature: 37
Patient temperature: 36.8
RATE: 18 resp/min
RATE: 28 resp/min
pCO2 arterial: 96 mmHg (ref 32–48)
pCO2 arterial: 67 mmHg (ref 32–48)
pH, Arterial: 7.19 — CL (ref 7.35–7.45)
pH, Arterial: 7.3 — ABNORMAL LOW (ref 7.35–7.45)
pO2, Arterial: 65 mmHg — ABNORMAL LOW (ref 83–108)
pO2, Arterial: 87 mmHg (ref 83–108)

## 2023-02-25 LAB — CBC
HCT: 45.1 % (ref 36.0–46.0)
Hemoglobin: 13.7 g/dL (ref 12.0–15.0)
MCH: 29.7 pg (ref 26.0–34.0)
MCHC: 30.4 g/dL (ref 30.0–36.0)
MCV: 97.8 fL (ref 80.0–100.0)
Platelets: 202 10*3/uL (ref 150–400)
RBC: 4.61 MIL/uL (ref 3.87–5.11)
RDW: 16.7 % — ABNORMAL HIGH (ref 11.5–15.5)
WBC: 27.3 10*3/uL — ABNORMAL HIGH (ref 4.0–10.5)
nRBC: 0 % (ref 0.0–0.2)

## 2023-02-25 LAB — BRAIN NATRIURETIC PEPTIDE: B Natriuretic Peptide: 231.4 pg/mL — ABNORMAL HIGH (ref 0.0–100.0)

## 2023-02-25 LAB — BASIC METABOLIC PANEL
Anion gap: 9 (ref 5–15)
BUN: 23 mg/dL (ref 8–23)
CO2: 27 mmol/L (ref 22–32)
Calcium: 8.4 mg/dL — ABNORMAL LOW (ref 8.9–10.3)
Chloride: 98 mmol/L (ref 98–111)
Creatinine, Ser: 0.53 mg/dL (ref 0.44–1.00)
GFR, Estimated: >60 mL/min (ref >60)
Glucose, Bld: 127 mg/dL — ABNORMAL HIGH (ref 70–99)
Potassium: 4.7 mmol/L (ref 3.5–5.1)
Sodium: 134 mmol/L — ABNORMAL LOW (ref 135–145)

## 2023-02-25 LAB — STREP PNEUMONIAE URINARY ANTIGEN: Strep Pneumo Urinary Antigen: NEGATIVE

## 2023-02-25 LAB — ECHOCARDIOGRAM COMPLETE
Height: 66.5 in
S' Lateral: 3.1 cm
Weight: 4356.29 oz

## 2023-02-25 MED ORDER — FENTANYL CITRATE (PF) 100 MCG/2ML IJ SOLN
INTRAMUSCULAR | Status: AC
  Administered 2023-02-25: 100 ug
  Filled 2023-02-25: qty 2

## 2023-02-25 MED ORDER — FENTANYL CITRATE PF 50 MCG/ML IJ SOSY: 25.0000 ug | PREFILLED_SYRINGE | Freq: Once | INTRAMUSCULAR | Status: AC

## 2023-02-25 MED ORDER — PROPOFOL 1000 MG/100ML IV EMUL
INTRAVENOUS | Status: AC
  Filled 2023-02-25: qty 100

## 2023-02-25 MED ORDER — ORAL CARE MOUTH RINSE
15.0000 mL | OROMUCOSAL | Status: DC
  Administered 2023-02-25 (×4): 15 mL via OROMUCOSAL

## 2023-02-25 MED ORDER — MIDAZOLAM HCL 2 MG/2ML IJ SOLN
INTRAMUSCULAR | Status: AC
  Administered 2023-02-25: 2 mg
  Filled 2023-02-25: qty 2

## 2023-02-25 MED ORDER — PHENYLEPHRINE 80 MCG/ML (10ML) SYRINGE FOR IV PUSH (FOR BLOOD PRESSURE SUPPORT)
PREFILLED_SYRINGE | INTRAVENOUS | Status: AC
  Administered 2023-02-25: 800 ug
  Filled 2023-02-25: qty 10

## 2023-02-25 MED ORDER — PROPOFOL 1000 MG/100ML IV EMUL
0.0000 ug/kg/min | INTRAVENOUS | Status: DC
  Administered 2023-02-25: 5 ug/kg/min via INTRAVENOUS
  Administered 2023-02-26: 15 ug/kg/min via INTRAVENOUS
  Administered 2023-02-26: 10 ug/kg/min via INTRAVENOUS
  Administered 2023-02-26 – 2023-02-27 (×3): 20 ug/kg/min via INTRAVENOUS
  Filled 2023-02-25 (×5): qty 100

## 2023-02-25 MED ORDER — SODIUM CHLORIDE 0.9 % IV SOLN: INTRAVENOUS | Status: DC | PRN

## 2023-02-25 MED ORDER — ETOMIDATE 2 MG/ML IV SOLN
INTRAVENOUS | Status: AC
  Administered 2023-02-25: 10 mg
  Filled 2023-02-25: qty 20

## 2023-02-25 MED ORDER — SODIUM CHLORIDE 0.9 % IV BOLUS
1000.0000 mL | Freq: Once | INTRAVENOUS | Status: AC
  Administered 2023-02-25: 1000 mL via INTRAVENOUS

## 2023-02-25 MED ORDER — FUROSEMIDE 10 MG/ML IJ SOLN
40.0000 mg | Freq: Two times a day (BID) | INTRAMUSCULAR | Status: DC
  Administered 2023-02-25 – 2023-02-26 (×3): 40 mg via INTRAVENOUS
  Filled 2023-02-25 (×3): qty 4

## 2023-02-25 MED ORDER — PERFLUTREN LIPID MICROSPHERE
1.0000 mL | INTRAVENOUS | Status: AC | PRN
  Administered 2023-02-25: 2 mL via INTRAVENOUS

## 2023-02-25 MED ORDER — ORAL CARE MOUTH RINSE: 15.0000 mL | OROMUCOSAL | Status: DC | PRN

## 2023-02-25 MED ORDER — ORAL CARE MOUTH RINSE
15.0000 mL | OROMUCOSAL | Status: DC
  Administered 2023-02-26 – 2023-02-27 (×19): 15 mL via OROMUCOSAL

## 2023-02-25 MED ORDER — FENTANYL 2500MCG IN NS 250ML (10MCG/ML) PREMIX INFUSION
INTRAVENOUS | Status: AC
  Administered 2023-02-25: 100 ug/h via INTRAVENOUS
  Filled 2023-02-25: qty 250

## 2023-02-25 MED ORDER — NOREPINEPHRINE 4 MG/250ML-% IV SOLN
0.0000 ug/min | INTRAVENOUS | Status: DC
  Administered 2023-02-26 (×2): 6 ug/min via INTRAVENOUS
  Administered 2023-02-26: 10 ug/min via INTRAVENOUS
  Administered 2023-02-27: 12 ug/min via INTRAVENOUS
  Filled 2023-02-25 (×4): qty 250

## 2023-02-25 MED ORDER — NOREPINEPHRINE 4 MG/250ML-% IV SOLN
INTRAVENOUS | Status: AC
  Administered 2023-02-25: 4 mg
  Filled 2023-02-25: qty 250

## 2023-02-25 MED ORDER — FENTANYL BOLUS VIA INFUSION
25.0000 ug | INTRAVENOUS | Status: DC | PRN
  Administered 2023-02-26: 50 ug via INTRAVENOUS
  Administered 2023-02-26: 75 ug via INTRAVENOUS
  Administered 2023-02-26: 50 ug via INTRAVENOUS
  Administered 2023-02-26 – 2023-02-27 (×2): 75 ug via INTRAVENOUS

## 2023-02-25 MED ORDER — POLYETHYLENE GLYCOL 3350 17 G PO PACK
17.0000 g | PACK | Freq: Every day | ORAL | Status: DC
  Administered 2023-02-25 – 2023-02-27 (×3): 17 g
  Filled 2023-02-25 (×4): qty 1

## 2023-02-25 MED ORDER — FENTANYL 2500MCG IN NS 250ML (10MCG/ML) PREMIX INFUSION
25.0000 ug/h | INTRAVENOUS | Status: DC
  Administered 2023-02-26: 75 ug/h via INTRAVENOUS
  Filled 2023-02-25: qty 250

## 2023-02-25 MED ORDER — SODIUM BICARBONATE 8.4 % IV SOLN
INTRAVENOUS | Status: AC
  Administered 2023-02-25: 50 meq
  Filled 2023-02-25: qty 50

## 2023-02-25 MED ORDER — ROCURONIUM BROMIDE 10 MG/ML (PF) SYRINGE
PREFILLED_SYRINGE | INTRAVENOUS | Status: AC
  Administered 2023-02-25: 50 mg
  Filled 2023-02-25: qty 10

## 2023-02-25 MED ORDER — DOCUSATE SODIUM 50 MG/5ML PO LIQD
100.0000 mg | Freq: Two times a day (BID) | ORAL | Status: DC
  Administered 2023-02-26 – 2023-02-27 (×3): 100 mg
  Filled 2023-02-25 (×4): qty 10

## 2023-02-25 NOTE — Progress Notes (Signed)
RT NOTE:  ABG obtained and sent to lab, lab notified.  

## 2023-02-25 NOTE — Progress Notes (Signed)
  Interdisciplinary Goals of Care Family Meeting   Date carried out: 02/25/2023  Location of the meeting: Bedside  Member's involved: Physician, Bedside Registered Nurse, and Family Member or next of kin  Durable Power of Attorney or acting medical decision maker: husband    Discussion: We discussed goals of care for [employer] .    Husband was not aware of having spoken about DNR status.  We discussed ongoing severe acidemia despite BIPAP and how intubation is the next step.   Given that this is a viral pneumonia and (per husband) patient is fully functional at baseline, we agree that a trial of intubation and mechanical ventilation (~4-5 days) and revisit.  Code status: DNR limited  Disposition: Continue current acute care  Time spent for the meeting: 8 minutes    Lorin Glass, MD  02/25/2023, 3:22 PM

## 2023-02-25 NOTE — H&P (Signed)
   NAME:  Kimberly Love, MRN:  [id number], DOB:  01-05-1951, LOS: 1 ADMISSION DATE:  02/24/2023, CONSULTATION DATE:  02/24/23 REFERRING MD:  Estell Harpin - TRH, CHIEF COMPLAINT:  SOB   History of Present Illness:  72 yo PMH mood disorder HTN,  GERD, tobacco use, diastolic HF (last echo 2013) s/p cataract surgery  presented to [facility] ED 6/28 w SOB. Sounds like ongoing x 4d. Associated URI sx, cough, DOE. No fever chills. Hypoxic on EMS arrival w sats 70s. CTA chest with no PE, no pleural effusion, mild cardiomegaly, BLL bronchial wall thickening, scatred ASD RUL   She required BiPAP in ED and was accepted in admission to ICU   Pertinent  Medical History   HTN Cataracts Tobacco use ?GERD   Significant Hospital Events: Including procedures, antibiotic start and stop dates in addition to other pertinent events   6/28 [facility] ED to [facility] ICU   Interim History / Subjective:  No events, resting comfortably on BIPAP, does have a leak.  Overall states feeling better.  Objective   Blood pressure (!) 139/97, pulse (!) 115, temperature 97.6 F (36.4 C), temperature source Axillary, resp. rate 20, height 5' 6.5" (1.689 m), weight 123.5 kg, SpO2 92 %.    FiO2 (%):  [40 %-50 %] 40 %   Intake/Output Summary (Last 24 hours) at 02/25/2023 4098 Last data filed at 02/25/2023 1191 Gross per 24 hour  Intake 557.38 ml  Output 470 ml  Net 87.38 ml   Filed Weights   02/24/23 1002 02/24/23 1816 02/25/23 0407  Weight: 117.9 kg 123.3 kg 123.5 kg    Examination: Chronically ill on bipap BIPAP with small-moderate leak 1+ edema Abd soft Lungs sounds diminished Moves to command RASS -1  Parainfluenza +  Resolved Hospital Problem list     Assessment & Plan:   Acute hypoxic and hypercarbic respiratory failure Parainfluenza + Likely undiagnosed OSA/OHS P - PCT neg, dc abx - Supportive care, no role for steroids at present - BIPAP at bedtime and PRN - RT to look and try better mask fit - See if we  can wean to [facility]  ?Afib- sinus tach on EKG, monitor tele, keep electrolytes normal  Diastolic HF HTN -last echo in 4782 with g1dd P -ECHO  -restart home lisinopril-hydrochlorothiazide  -start standing lasix, monitor BMP  Elevated LFTs- suspect NASH -trend   Hx GERD   Hx depression/anxiety -restart home lexapro when taking POs   DNR  Status - Confirmed on Admit  Best Practice (right click and "Reselect all SmartList Selections" daily)   Diet/type: cardiac diet DVT prophylaxis: LMWH GI prophylaxis: PPI Lines: N/A Foley:  N/A Code Status:  DNR Last date of multidisciplinary goals of care discussion [6/28]  32 min cc time weaning/adjusting BIPAP settings  Myrla Halsted MD PCCM

## 2023-02-25 NOTE — Procedures (Signed)
Intubation Procedure Note  Kimberly Love  [id number]  04/29/1951  Date:02/25/23  Time:3:32 PM   Provider Performing:Wilna Pennie C Katrinka Blazing    Procedure: Intubation (31500)  Indication(s) Respiratory Failure  Consent Verbak   Anesthesia Etomidate, Versed, and Fentanyl   Time Out Verified patient identification, verified procedure, site/side was marked, verified correct patient position, special equipment/implants available, medications/allergies/relevant history reviewed, required imaging and test results available.   Sterile Technique Usual hand hygeine, masks, and gloves were used   Procedure Description Patient positioned in bed supine.  Sedation given as noted above.  Patient was intubated with endotracheal tube using Glidescope.  View was Grade 1 full glottis .  Number of attempts was 1.  Colorimetric CO2 detector was consistent with tracheal placement.   Complications/Tolerance None; patient tolerated the procedure well. Chest X-ray is ordered to verify placement.   EBL Minimal   Specimen(s) None

## 2023-02-25 NOTE — Procedures (Signed)
Central Venous Catheter Insertion Procedure Note  Kimberly Love  [id number]  [birth year]  Date:02/25/23  Time:4:51 PM   Provider Performing:Calixto Pavel Salena Saner Katrinka Blazing   Procedure: Insertion of Non-tunneled Central Venous [phone]) with US guidance ([id number])   Indication(s) Medication administration  Consent Risks of the procedure as well as the alternatives and risks of each were explained to the patient and/or caregiver.  Consent for the procedure was obtained and is signed in the bedside chart  Anesthesia Topical only with 1% lidocaine   Timeout Verified patient identification, verified procedure, site/side was marked, verified correct patient position, special equipment/implants available, medications/allergies/relevant history reviewed, required imaging and test results available.  Sterile Technique Maximal sterile technique including full sterile barrier drape, hand hygiene, sterile gown, sterile gloves, mask, hair covering, sterile ultrasound probe cover (if used).  Procedure Description Area of catheter insertion was cleaned with chlorhexidine and draped in sterile fashion.  With real-time ultrasound guidance a central venous catheter was placed into the left internal jugular vein. Nonpulsatile blood flow and easy flushing noted in all ports.  The catheter was sutured in place and sterile dressing applied.  Complications/Tolerance None; patient tolerated the procedure well. Chest X-ray is ordered to verify placement for internal jugular or subclavian cannulation.   Chest x-ray is not ordered for femoral cannulation.  EBL Minimal  Specimen(s) None

## 2023-02-25 NOTE — Progress Notes (Signed)
   02/25/23 0502  BiPAP/CPAP/SIPAP  BiPAP/CPAP/SIPAP Pt Type Adult  BiPAP/CPAP/SIPAP V60  Mask Type Full face mask  Mask Size Medium  Set Rate 18 breaths/min  Respiratory Rate 19 breaths/min  IPAP 22 cmH20  EPAP 8 cmH2O  FiO2 (%) 40 %  Minute Ventilation 7  Leak 0  Peak Inspiratory Pressure (PIP) 18  Tidal Volume (Vt) 405  Patient Home Equipment No  Auto Titrate No  Press High Alarm 35 cmH2O  Press Low Alarm 5 cmH2O  Oxygen Percent 40 %  BiPAP/CPAP /SiPAP Vitals  Pulse Rate 100  Resp 19  BP (!) 125/59  SpO2 93 %  Bilateral Breath Sounds Diminished  MEWS Score/Color  MEWS Score 0  MEWS Score Color [place] Si

## 2023-02-25 NOTE — Progress Notes (Signed)
  Echocardiogram 2D Echocardiogram has been performed.  Kimberly Love 02/25/2023, 8:54 AM

## 2023-02-26 DIAGNOSIS — J9602 Acute respiratory failure with hypercapnia: Secondary | ICD-10-CM | POA: Diagnosis not present

## 2023-02-26 LAB — GLUCOSE, CAPILLARY
Glucose-Capillary: 138 mg/dL — ABNORMAL HIGH (ref 70–99)
Glucose-Capillary: 141 mg/dL — ABNORMAL HIGH (ref 70–99)
Glucose-Capillary: 145 mg/dL — ABNORMAL HIGH (ref 70–99)
Glucose-Capillary: 175 mg/dL — ABNORMAL HIGH (ref 70–99)

## 2023-02-26 LAB — BLOOD GAS, ARTERIAL
Acid-Base Excess: 10 mmol/L — ABNORMAL HIGH (ref 0.0–2.0)
Bicarbonate: 33.3 mmol/L — ABNORMAL HIGH (ref 20.0–28.0)
Drawn by: 270211
FIO2: 50 %
MECHVT: 480 mL
O2 Saturation: 100 %
PEEP: 5 cmH2O
Patient temperature: 37
RATE: 28 resp/min
pCO2 arterial: 39 mmHg (ref 32–48)
pH, Arterial: 7.54 — ABNORMAL HIGH (ref 7.35–7.45)
pO2, Arterial: 120 mmHg — ABNORMAL HIGH (ref 83–108)

## 2023-02-26 LAB — MAGNESIUM: Magnesium: 2.1 mg/dL (ref 1.7–2.4)

## 2023-02-26 LAB — BASIC METABOLIC PANEL
Anion gap: 10 (ref 5–15)
BUN: 29 mg/dL — ABNORMAL HIGH (ref 8–23)
CO2: 33 mmol/L — ABNORMAL HIGH (ref 22–32)
Calcium: 8.5 mg/dL — ABNORMAL LOW (ref 8.9–10.3)
Chloride: 96 mmol/L — ABNORMAL LOW (ref 98–111)
Creatinine, Ser: 0.7 mg/dL (ref 0.44–1.00)
GFR, Estimated: >60 mL/min (ref >60)
Glucose, Bld: 140 mg/dL — ABNORMAL HIGH (ref 70–99)
Potassium: 3.5 mmol/L (ref 3.5–5.1)
Sodium: 139 mmol/L (ref 135–145)

## 2023-02-26 LAB — CULTURE, BLOOD (ROUTINE X 2)

## 2023-02-26 LAB — CULTURE, RESPIRATORY W GRAM STAIN

## 2023-02-26 LAB — EXPECTORATED SPUTUM ASSESSMENT W GRAM STAIN, RFLX TO RESP C

## 2023-02-26 LAB — TRIGLYCERIDES: Triglycerides: 96 mg/dL (ref <150)

## 2023-02-26 LAB — PROCALCITONIN: Procalcitonin: 0.18 ng/mL

## 2023-02-26 MED ORDER — LISINOPRIL 10 MG PO TABS
20.0000 mg | ORAL_TABLET | Freq: Every day | ORAL | Status: DC
  Administered 2023-02-26: 20 mg
  Filled 2023-02-26: qty 2

## 2023-02-26 MED ORDER — NICOTINE 14 MG/24HR TD PT24
14.0000 mg | MEDICATED_PATCH | Freq: Every day | TRANSDERMAL | Status: DC
  Administered 2023-02-26 – 2023-03-10 (×13): 14 mg via TRANSDERMAL
  Filled 2023-02-26 (×14): qty 1

## 2023-02-26 MED ORDER — HYDROCHLOROTHIAZIDE 25 MG PO TABS
25.0000 mg | ORAL_TABLET | Freq: Every day | ORAL | Status: DC
  Administered 2023-02-26: 25 mg
  Filled 2023-02-26 (×2): qty 1

## 2023-02-26 MED ORDER — VITAL AF 1.2 CAL PO LIQD
1000.0000 mL | ORAL | Status: DC
  Administered 2023-02-26: 1000 mL

## 2023-02-26 MED ORDER — OSMOLITE 1.2 CAL PO LIQD
1000.0000 mL | ORAL | Status: DC
  Administered 2023-02-26: 1000 mL

## 2023-02-26 MED ORDER — MELATONIN 3 MG PO TABS: 3.0000 mg | ORAL_TABLET | Freq: Every evening | ORAL | Status: DC | PRN

## 2023-02-26 MED ORDER — MAGNESIUM SULFATE 2 GM/50ML IV SOLN
2.0000 g | Freq: Once | INTRAVENOUS | Status: AC
  Administered 2023-02-26: 2 g via INTRAVENOUS
  Filled 2023-02-26: qty 50

## 2023-02-26 MED ORDER — GUAIFENESIN 100 MG/5ML PO LIQD: 10.0000 mL | ORAL | Status: DC | PRN

## 2023-02-26 MED ORDER — ENOXAPARIN SODIUM 120 MG/0.8ML IJ SOSY
120.0000 mg | PREFILLED_SYRINGE | Freq: Two times a day (BID) | INTRAMUSCULAR | Status: DC
  Administered 2023-02-26 – 2023-03-04 (×12): 120 mg via SUBCUTANEOUS
  Filled 2023-02-26 (×13): qty 0.8

## 2023-02-26 NOTE — Plan of Care (Signed)

## 2023-02-26 NOTE — Progress Notes (Signed)
Afib, intermittent, likely reactive Give some mag Switch lovenox to full dose If continues may need NoAC as OP

## 2023-02-26 NOTE — Progress Notes (Signed)
eLink Physician-Brief Progress Note Patient Name: Kimberly Love DOB: 1951/05/15 MRN: [id number]   Date of Service  02/26/2023  HPI/Events of Note  Asked to review EKG for A-fib.  eICU Interventions  Patient in sinus rhythm.  No A-fib noted on EKG.     Intervention Category Evaluation Type: Other  Carilyn Goodpasture 02/26/2023, 12:45 AM

## 2023-02-26 NOTE — Progress Notes (Signed)
Brief Nutrition Note  Consult received for enteral/tube feeding initiation and management.  Adult Enteral Nutrition Protocol initiated. Full assessment to follow.  OG tube in place with tip located in stomach per xray imaging.   Admitting Dx: Acute respiratory failure (HCC) [J96.00] Sinus tachycardia [R00.0] Acute respiratory acidosis (HCC) [J96.02] Sepsis with acute hypercapnic respiratory failure without septic shock, due to unspecified organism (HCC) [A41.9, R65.20, J96.02] Acute respiratory failure with hypoxia and hypercapnia (HCC) [J96.01, J96.02]  Body mass index is 42.9 kg/m. Pt meets criteria for morbid obesity based on current BMI.  Labs:  Recent Labs  Lab 02/24/23 1103 02/25/23 0544 02/26/23 0405  NA 132* 134* 139  K 4.5 4.7 3.5  CL 93* 98 96*  CO2 26 27 33*  BUN 22 23 29*  CREATININE 0.81 0.53 0.70  CALCIUM 8.5* 8.4* 8.5*  MG  --   --  2.1  GLUCOSE 167* 127* 140*    Kimberly Love  Bing, RD, LDN, CNSC.

## 2023-02-26 NOTE — Progress Notes (Signed)
NAME:  NYLIYAH ALLOY, MRN:  [id number], DOB:  01-Jan-1951, LOS: 2 ADMISSION DATE:  02/24/2023, CONSULTATION DATE:  02/24/23 REFERRING MD:  Estell Harpin - TRH, CHIEF COMPLAINT:  SOB   History of Present Illness:  72 yo PMH mood disorder HTN,  GERD, tobacco use, diastolic HF (last echo 2013) s/p cataract surgery  presented to [facility] ED 6/28 w SOB. Sounds like ongoing x 4d. Associated URI sx, cough, DOE. No fever chills. Hypoxic on EMS arrival w sats 70s. CTA chest with no PE, no pleural effusion, mild cardiomegaly, BLL bronchial wall thickening, scatred ASD RUL   She required BiPAP in ED and was accepted in admission to ICU   Pertinent  Medical History   HTN Cataracts Tobacco use ?GERD   Significant Hospital Events: Including procedures, antibiotic start and stop dates in addition to other pertinent events   6/28 [facility] ED to [facility] ICU   Interim History / Subjective:  Much improved after intubation yesterday. Minimal pressor needs related to sedation.  Objective   Blood pressure (!) 89/45, pulse (!) 123, temperature 98.7 F (37.1 C), temperature source Axillary, resp. rate (!) 9, height 5' 6.5" (1.689 m), weight 122.4 kg, SpO2 94 %.    Vent Mode: PRVC FiO2 (%):  [40 %-50 %] 50 % Set Rate:  [20 bmp-28 bmp] 20 bmp Vt Set:  [480 mL] 480 mL PEEP:  [5 cmH20] 5 cmH20 Plateau Pressure:  [26 cmH20-29 cmH20] 29 cmH20   Intake/Output Summary (Last 24 hours) at 02/26/2023 1044 Last data filed at 02/26/2023 0645 Gross per 24 hour  Intake 1446.88 ml  Output 1050 ml  Net 396.88 ml    Filed Weights   02/24/23 1816 02/25/23 0407 02/26/23 0415  Weight: 123.3 kg 123.5 kg 122.4 kg    Examination: No distress RASS -1 Follows commands Lungs distant due to body habitus Driving pressure slightly high on vent likely due to body habitus + PNA No edema Abd soft NGT in place  Parainfluenza + BUN/Cr/CO2 slightly up WBC up a good deal  Patient Lines/Drains/Airways Status     Active  Line/Drains/Airways     Name Placement date Placement time Site Days   Peripheral IV 02/24/23 22 G 1.75" Anterior;Left Forearm 02/24/23  1807  Forearm  2   Peripheral IV 02/24/23 Anterior;Left Arm 02/24/23  1900  Arm  2   CVC Triple Lumen 02/25/23 Left Internal jugular 02/25/23  --  -- 1   NG/OG Vented/Dual Lumen 16 Fr. Oral Marking at nare/corner of mouth 65 cm 02/25/23  1641  Oral  1   External Urinary Catheter 02/24/23  1836  --  2   Airway 7.5 mm 02/25/23  1520  -- 1   Incision 07/02/12 Knee Left 07/02/12  1039  -- 3891   Incision (Closed) 01/31/20 Eye Left 01/31/20  0939  -- 1122   Incision (Closed) 02/14/20 Eye Right 02/14/20  0858  -- 1108   Pressure Injury 02/24/23 Buttocks Stage 1 -  Intact skin with non-blanchable redness of a localized area usually over a bony prominence. 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 Smoker Diastolic HF HTN Sedation associated shock- stable GOC- needs to be clarified once she is awake off vent, husband did not think she was DNR Mild AKI- after diuretic challenge, will hold today  - Continue to hold on Abx but low threshold to start should she develop fevers; will recheck Pct - Apneic on PS, will check ABG, suspect may just be alkalotic - Nicotine patch - Start TF - May be able to liberate as early as tomorrow if she continues to do this well - Clarify GOC once off vent with husband and son there so everyone is in [employer] (right click and "Reselect all SmartList Selections" daily)   Diet/type: TF DVT  prophylaxis: LMWH GI prophylaxis: PPI Lines: yes Foley:  no Code Status:  full Last date of multidisciplinary goals of care discussion [6/28]  38 min cc time  Myrla Halsted MD PCCM

## 2023-02-26 NOTE — Progress Notes (Signed)
Notified lab abg was sent

## 2023-02-27 DIAGNOSIS — L8990 Pressure ulcer of unspecified site, unspecified stage: Secondary | ICD-10-CM | POA: Insufficient documentation

## 2023-02-27 DIAGNOSIS — J9602 Acute respiratory failure with hypercapnia: Secondary | ICD-10-CM | POA: Diagnosis not present

## 2023-02-27 DIAGNOSIS — Z7189 Other specified counseling: Secondary | ICD-10-CM

## 2023-02-27 DIAGNOSIS — B348 Other viral infections of unspecified site: Secondary | ICD-10-CM

## 2023-02-27 DIAGNOSIS — Z66 Do not resuscitate: Secondary | ICD-10-CM

## 2023-02-27 LAB — GLUCOSE, CAPILLARY
Glucose-Capillary: 105 mg/dL — ABNORMAL HIGH (ref 70–99)
Glucose-Capillary: 109 mg/dL — ABNORMAL HIGH (ref 70–99)
Glucose-Capillary: 131 mg/dL — ABNORMAL HIGH (ref 70–99)
Glucose-Capillary: 132 mg/dL — ABNORMAL HIGH (ref 70–99)
Glucose-Capillary: 164 mg/dL — ABNORMAL HIGH (ref 70–99)
Glucose-Capillary: 187 mg/dL — ABNORMAL HIGH (ref 70–99)

## 2023-02-27 LAB — CBC
HCT: 39.6 % (ref 36.0–46.0)
Hemoglobin: 12.3 g/dL (ref 12.0–15.0)
MCH: 28.9 pg (ref 26.0–34.0)
MCHC: 31.1 g/dL (ref 30.0–36.0)
MCV: 93 fL (ref 80.0–100.0)
Platelets: 254 10*3/uL (ref 150–400)
RBC: 4.26 MIL/uL (ref 3.87–5.11)
RDW: 16.2 % — ABNORMAL HIGH (ref 11.5–15.5)
WBC: 12.6 10*3/uL — ABNORMAL HIGH (ref 4.0–10.5)
nRBC: 0 % (ref 0.0–0.2)

## 2023-02-27 LAB — BASIC METABOLIC PANEL
Anion gap: 9 (ref 5–15)
BUN: 20 mg/dL (ref 8–23)
CO2: 37 mmol/L — ABNORMAL HIGH (ref 22–32)
Calcium: 8.4 mg/dL — ABNORMAL LOW (ref 8.9–10.3)
Chloride: 92 mmol/L — ABNORMAL LOW (ref 98–111)
Creatinine, Ser: 0.51 mg/dL (ref 0.44–1.00)
GFR, Estimated: >60 mL/min (ref >60)
Glucose, Bld: 204 mg/dL — ABNORMAL HIGH (ref 70–99)
Potassium: 2.6 mmol/L — CL (ref 3.5–5.1)
Sodium: 138 mmol/L (ref 135–145)

## 2023-02-27 LAB — CULTURE, BLOOD (ROUTINE X 2)
Culture: NO GROWTH
Culture: NO GROWTH

## 2023-02-27 LAB — MAGNESIUM: Magnesium: 2.2 mg/dL (ref 1.7–2.4)

## 2023-02-27 MED ORDER — POTASSIUM CHLORIDE 10 MEQ/50ML IV SOLN
10.0000 meq | INTRAVENOUS | Status: AC
  Administered 2023-02-27 (×4): 10 meq via INTRAVENOUS
  Filled 2023-02-27 (×4): qty 50

## 2023-02-27 MED ORDER — INSULIN ASPART 100 UNIT/ML IJ SOLN: 0.0000 [IU] | INTRAMUSCULAR | Status: DC

## 2023-02-27 MED ORDER — FUROSEMIDE 10 MG/ML IJ SOLN
40.0000 mg | Freq: Once | INTRAMUSCULAR | Status: AC
  Administered 2023-02-27: 40 mg via INTRAVENOUS
  Filled 2023-02-27: qty 4

## 2023-02-27 MED ORDER — ORAL CARE MOUTH RINSE
15.0000 mL | OROMUCOSAL | Status: DC
  Administered 2023-02-27 (×2): 15 mL via OROMUCOSAL

## 2023-02-27 MED ORDER — PANTOPRAZOLE SODIUM 40 MG IV SOLR
40.0000 mg | Freq: Every day | INTRAVENOUS | Status: DC
  Administered 2023-02-27 – 2023-03-01 (×3): 40 mg via INTRAVENOUS
  Filled 2023-02-27 (×3): qty 10

## 2023-02-27 MED ORDER — POTASSIUM CHLORIDE 20 MEQ PO PACK
40.0000 meq | PACK | Freq: Once | ORAL | Status: AC
  Administered 2023-02-27: 40 meq
  Filled 2023-02-27: qty 2

## 2023-02-27 MED ORDER — ORAL CARE MOUTH RINSE: 15.0000 mL | OROMUCOSAL | Status: DC | PRN

## 2023-02-27 MED ORDER — INSULIN ASPART 100 UNIT/ML IJ SOLN
0.0000 [IU] | INTRAMUSCULAR | Status: DC
  Administered 2023-02-27: 2 [IU] via SUBCUTANEOUS
  Administered 2023-02-27: 1 [IU] via SUBCUTANEOUS

## 2023-02-27 NOTE — IPAL (Signed)
  Interdisciplinary Goals of Care Family Meeting   Date carried out: 02/27/2023  Location of the meeting: Bedside  Member's involved: Nurse Practitioner and Family Member or next of kin  Durable Power of Attorney or acting medical decision maker: Self  Husband if unable     Discussion: We discussed goals of care for [employer] .  Talked about code status -- on admission pt indicated DNR/I and felt this was adequately d/w husband. Later this admit, husband was not sure if this was totally clear and reversed status to Full code, pt electively intubated for resp failure.   Successfully extubated this morning, pt able to engage in this conversation this afternoon w her husband. Pt affirms that she wanted to be DNR/I, however now in wake of successful MV support, would like the possibility of elective intubation for resp failure to remain an option. Remains clear in DNR status in event of arrest.   -DNR -full scope of medical tx otherwise including adv airway if needed  -husband and pt articulate understanding   Code status:   Code Status: DNR   Disposition: Continue current acute care  Time spent for the meeting:     Lanier Clam, NP  02/27/2023, 2:08 PM

## 2023-02-27 NOTE — Progress Notes (Signed)
NAME:  Kimberly Love, MRN:  [id number], DOB:  Jan 23, 1951, LOS: 3 ADMISSION DATE:  02/24/2023, CONSULTATION DATE:  02/24/23 REFERRING MD:  Estell Harpin - TRH, CHIEF COMPLAINT:  SOB   History of Present Illness:  72 yo PMH mood disorder HTN,  GERD, tobacco use, diastolic HF (last echo 2013) s/p cataract surgery  presented to [facility] ED 6/28 w SOB. Sounds like ongoing x 4d. Associated URI sx, cough, DOE. No fever chills. Hypoxic on EMS arrival w sats 70s. CTA chest with no PE, no pleural effusion, mild cardiomegaly, BLL bronchial wall thickening, scatred ASD RUL   She required BiPAP in ED and was accepted in admission to ICU   Pertinent  Medical History   HTN Cataracts Tobacco use ?GERD   Significant Hospital Events: Including procedures, antibiotic start and stop dates in addition to other pertinent events   6/28 [facility] ED to [facility] ICU  6/29 intubated  Interim History / Subjective:  K replaced In and out of fib WBC improved AKI resolved   Objective   Blood pressure 123/61, pulse (!) 124, temperature 100 F (37.8 C), temperature source Axillary, resp. rate 19, height 5' 6.5" (1.689 m), weight 120.8 kg, SpO2 94 %.    Vent Mode: CPAP;PSV FiO2 (%):  [45 %] 45 % Set Rate:  [20 bmp] 20 bmp Vt Set:  [480 mL] 480 mL PEEP:  [5 cmH20] 5 cmH20 Pressure Support:  [5 cmH20] 5 cmH20 Plateau Pressure:  [21 cmH20-28 cmH20] 25 cmH20   Intake/Output Summary (Last 24 hours) at 02/27/2023 1020 Last data filed at 02/27/2023 0900 Gross per 24 hour  Intake 2177.78 ml  Output 1300 ml  Net 877.78 ml   Filed Weights   02/25/23 0407 02/26/23 0415 02/27/23 0500  Weight: 123.5 kg 122.4 kg 120.8 kg    Examination: General: Chronically and critically ill older adult F NAD  Neuro: Awake following commands PERRL HEENT: NCAT pink mm ETT secure  CV: tachycardic cap refill < 3 sec  Pulm: RUL crackles and rhonchi. Symmetrical chest expansion on MV  GI: soft obese ndnt  GU: purewick  MSK: no acute joint  deformity. 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 Mag ok   Hyperglycemia -SSI  GOC/Code status -on admission pt w appropriate mentation endorsed DNR/I with appropriate articulation of this and known that  if resp status declined and was not intubated could be fatal, and in event of arrest would not be resuscitated. She stated this was d/w her husband previously however when her mentation declined the next day he was unable to corroborate this and code status reversed to Full Code    Best Practice (right click and "Reselect all SmartList Selections" daily)   Diet/type: TF  DVT prophylaxis: LMWH GI prophylaxis: PPI Lines: yes Foley:  no Code Status:  full Last date of multidisciplinary goals of care  discussion [7/1   CRITICAL CARE Performed by: Lanier Clam   Total critical care time: 40 minutes  Critical care time was exclusive of separately billable procedures and treating other patients. Critical care was necessary to treat or prevent imminent or life-threatening deterioration.  Critical care was time spent personally by me on the following activities: development of treatment plan with patient and/or surrogate as well as nursing, discussions with consultants, evaluation of patient's response to treatment, examination of patient, obtaining history  from patient or surrogate, ordering and performing treatments and interventions, ordering and review of laboratory studies, ordering and review of radiographic studies, pulse oximetry and re-evaluation of patient's condition.  Tessie Fass MSN, AGACNP-BC [facility] Pulmonary/Critical Care Medicine Amion for pager  02/27/2023, 10:20 AM

## 2023-02-27 NOTE — Progress Notes (Signed)
eLink Physician-Brief Progress Note Patient Name: Kimberly Love DOB: 20-Mar-1951 MRN: [id number]   Date of Service  02/27/2023  HPI/Events of Note  Last 3 CBG 175> 140> 164 and point of care communication stats in patient with no history of DM notify MD if CBG > 160. Pt has TF running at 30.  eICU Interventions  Ordered to start low dose SSI q 4     Intervention Category Intermediate Interventions: Hyperglycemia - evaluation and treatment  Rosalie Gums Alisan Dokes 02/27/2023, 6:21 AM

## 2023-02-27 NOTE — Progress Notes (Signed)
[facility] ADULT ICU REPLACEMENT PROTOCOL   The patient does apply for the [facility] Adult ICU Electrolyte Replacment Protocol based on the criteria listed below:   1.Exclusion criteria: TCTS, ECMO, Dialysis, and Myasthenia Gravis patients 2. Is GFR >/= 30 ml/min? Yes.    Patient's GFR today is > 60 3. Is SCr </= 2? Yes.   Patient's SCr is 0.51 mg/dL 4. Did SCr increase >/= 0.5 in 24 hours? No. 5.Pt's weight >40kg  Yes.   6. Abnormal electrolyte(s): K+ 2.6  7. Electrolytes replaced per protocol 8.  Call MD STAT for K+ </= 2.5, Phos </= 1, or Mag </= 1 Physician:  Dr Tollie Eth, Jettie Booze 02/27/2023 7:02 AM

## 2023-02-27 NOTE — Progress Notes (Signed)
Pt found on CPAP/PS of 5/5.  Pt is tolerating well, will monitor for possible extubation. HR 128, rr 24, Sats 95% on 45% FIO2.

## 2023-02-27 NOTE — Procedures (Signed)
Extubation Procedure Note  Patient Details:   Name: Kimberly Love DOB: 09-Mar-1951 MRN: [id number]   Airway Documentation:    Vent end date: (not recorded) Vent end time: (not recorded)   Evaluation  O2 sats: transiently fell during during procedure Complications: No apparent complications Patient did tolerate procedure well. Bilateral Breath Sounds: Clear   Yes  Pt placed on 4l [place] initially after extubation with sats 86-88% FIO2 increased to 8L and sats of 94-95%. RN at bedside. Pt alert and speaking.   Cain Sieve 02/27/2023, 10:54 AM

## 2023-02-27 NOTE — Progress Notes (Signed)
PT extubated per MD order. Pt placed on 8L [place] to maintain sats. PT was on 4l with sats of 87-88%.  Will monitor and wean O2 as tolerated.  RN at bedside.

## 2023-02-28 ENCOUNTER — Telehealth: Payer: Self-pay | Admitting: Acute Care

## 2023-02-28 DIAGNOSIS — J9602 Acute respiratory failure with hypercapnia: Secondary | ICD-10-CM | POA: Diagnosis not present

## 2023-02-28 LAB — CBC
HCT: 41 % (ref 36.0–46.0)
Hemoglobin: 12.2 g/dL (ref 12.0–15.0)
MCH: 28.8 pg (ref 26.0–34.0)
MCHC: 29.8 g/dL — ABNORMAL LOW (ref 30.0–36.0)
MCV: 96.9 fL (ref 80.0–100.0)
Platelets: 225 10*3/uL (ref 150–400)
RBC: 4.23 MIL/uL (ref 3.87–5.11)
RDW: 16.5 % — ABNORMAL HIGH (ref 11.5–15.5)
WBC: 10.7 10*3/uL — ABNORMAL HIGH (ref 4.0–10.5)
nRBC: 0 % (ref 0.0–0.2)

## 2023-02-28 LAB — GLUCOSE, CAPILLARY
Glucose-Capillary: 102 mg/dL — ABNORMAL HIGH (ref 70–99)
Glucose-Capillary: 108 mg/dL — ABNORMAL HIGH (ref 70–99)
Glucose-Capillary: 128 mg/dL — ABNORMAL HIGH (ref 70–99)
Glucose-Capillary: 136 mg/dL — ABNORMAL HIGH (ref 70–99)
Glucose-Capillary: 137 mg/dL — ABNORMAL HIGH (ref 70–99)
Glucose-Capillary: 96 mg/dL (ref 70–99)

## 2023-02-28 LAB — BASIC METABOLIC PANEL
Anion gap: 9 (ref 5–15)
BUN: 14 mg/dL (ref 8–23)
CO2: 38 mmol/L — ABNORMAL HIGH (ref 22–32)
Calcium: 8.4 mg/dL — ABNORMAL LOW (ref 8.9–10.3)
Chloride: 95 mmol/L — ABNORMAL LOW (ref 98–111)
Creatinine, Ser: 0.46 mg/dL (ref 0.44–1.00)
GFR, Estimated: >60 mL/min (ref >60)
Glucose, Bld: 116 mg/dL — ABNORMAL HIGH (ref 70–99)
Potassium: 3.7 mmol/L (ref 3.5–5.1)
Sodium: 142 mmol/L (ref 135–145)

## 2023-02-28 LAB — CULTURE, RESPIRATORY W GRAM STAIN: Gram Stain: NONE SEEN

## 2023-02-28 LAB — HEMOGLOBIN A1C
Hgb A1c MFr Bld: 6.2 % — ABNORMAL HIGH (ref 4.8–5.6)
Mean Plasma Glucose: 131 mg/dL

## 2023-02-28 LAB — CULTURE, BLOOD (ROUTINE X 2)

## 2023-02-28 LAB — LEGIONELLA PNEUMOPHILA SEROGP 1 UR AG
L. pneumophila Serogp 1 Ur Ag: NEGATIVE
Source of Sample: NEGATIVE

## 2023-02-28 LAB — MAGNESIUM: Magnesium: 2.2 mg/dL (ref 1.7–2.4)

## 2023-02-28 MED ORDER — ENSURE MAX PROTEIN PO LIQD
11.0000 [oz_av] | Freq: Two times a day (BID) | ORAL | Status: DC
  Administered 2023-03-01 – 2023-03-06 (×10): 11 [oz_av] via ORAL
  Filled 2023-02-28 (×16): qty 330

## 2023-02-28 MED ORDER — DOCUSATE SODIUM 100 MG PO CAPS
100.0000 mg | ORAL_CAPSULE | Freq: Two times a day (BID) | ORAL | Status: DC
  Administered 2023-02-28 – 2023-03-11 (×10): 100 mg via ORAL
  Filled 2023-02-28 (×21): qty 1

## 2023-02-28 MED ORDER — PRAVASTATIN SODIUM 20 MG PO TABS
10.0000 mg | ORAL_TABLET | Freq: Every day | ORAL | Status: DC
  Administered 2023-03-01 – 2023-03-10 (×10): 10 mg via ORAL
  Filled 2023-02-28 (×11): qty 1

## 2023-02-28 MED ORDER — ORAL CARE MOUTH RINSE
15.0000 mL | OROMUCOSAL | Status: DC
  Administered 2023-02-28 – 2023-03-10 (×26): 15 mL via OROMUCOSAL

## 2023-02-28 MED ORDER — ORAL CARE MOUTH RINSE: 15.0000 mL | OROMUCOSAL | Status: DC | PRN

## 2023-02-28 MED ORDER — GUAIFENESIN 100 MG/5ML PO LIQD
10.0000 mL | ORAL | Status: DC | PRN
  Administered 2023-02-28 – 2023-03-10 (×24): 10 mL via ORAL
  Filled 2023-02-28 (×24): qty 10

## 2023-02-28 MED ORDER — POLYETHYLENE GLYCOL 3350 17 G PO PACK
17.0000 g | PACK | Freq: Every day | ORAL | Status: DC
  Administered 2023-02-28 – 2023-03-11 (×4): 17 g via ORAL
  Filled 2023-02-28 (×12): qty 1

## 2023-02-28 MED ORDER — ORAL CARE MOUTH RINSE: 15.0000 mL | OROMUCOSAL | Status: DC

## 2023-02-28 MED ORDER — MELATONIN 3 MG PO TABS
3.0000 mg | ORAL_TABLET | Freq: Every evening | ORAL | Status: DC | PRN
  Administered 2023-03-10: 3 mg via ORAL
  Filled 2023-02-28 (×2): qty 1

## 2023-02-28 NOTE — Plan of Care (Signed)
  Problem: Health Behavior/Discharge Planning: Goal: Ability to manage health-related needs will improve Outcome: Progressing   Problem: Clinical Measurements: Goal: Ability to maintain clinical measurements within normal limits will improve Outcome: Progressing Goal: Will remain free from infection Outcome: Progressing Goal: Diagnostic test results will improve Outcome: Progressing   Problem: Activity: Goal: Risk for activity intolerance will decrease Outcome: Progressing   Problem: Nutrition: Goal: Adequate nutrition will be maintained Outcome: Progressing   Problem: Coping: Goal: Level of anxiety will decrease Outcome: Progressing   Problem: Elimination: Goal: Will not experience complications related to bowel motility Outcome: Progressing Goal: Will not experience complications related to urinary retention Outcome: Progressing   Problem: Safety: Goal: Ability to remain free from injury will improve Outcome: Progressing

## 2023-02-28 NOTE — Evaluation (Signed)
Physical Therapy Evaluation Patient Details Name: Kimberly Love MRN: [id number] DOB: 03/17/1951 Today's Date: 02/28/2023  History of Present Illness  72 yo PMH mood disorder HTN, GERD, tobacco use, diastolic HF (last echo 2013) s/p cataract surgery presented to [facility] ED 6/28 w SOB, Associated URI symptoms, cough, DOE. No fever chills. Hypoxic on EMS arrival with sats in  70s. CTA chest with no PE, no pleural effusion, mild cardiomegaly, BLL bronchial wall thickening, requiring ventilator support and ICU admission, extubated  7124.  Clinical Impression      Pt admitted with above diagnosis.  Pt currently with functional limitations due to the deficits listed below (see PT Problem List). Pt will benefit from acute skilled PT to increase their independence and safety with mobility to allow discharge.   The patient received seated in recliner, HR 129, Spo2 on 4 L HFNC 95%.The patient ambulated x 6' with Rw, seated rest break then  stood and transferred to [facility].  Patient independent at baseline, has family support and plans return home.  HR max 132 during activity, Spo2 low 91% on 4 L.     Assistance Recommended at Discharge Intermittent Supervision/Assistance  If plan is discharge home, recommend the following:  Can travel by private vehicle  A little help with walking and/or transfers;A little help with bathing/dressing/bathroom;Help with stairs or ramp for entrance;Assistance with cooking/housework;Assist for transportation        Equipment Recommendations None recommended by PT  Recommendations for Other Services       Functional Status Assessment Patient has had a recent decline in their functional status and demonstrates the ability to make significant improvements in function in a reasonable and predictable amount of time.     Precautions / Restrictions Precautions Precautions: Fall Precaution Comments: watch sats, HR Restrictions Weight Bearing Restrictions: No       Mobility  Bed Mobility               General bed mobility comments: in recliner    Transfers Overall transfer level: Needs assistance Equipment used: Rolling walker (2 wheels) Transfers: Sit to/from Stand Sit to Stand: Supervision           General transfer comment: no support, assist with lines, stood x 2, Having BM second time, step to [facility] using RW.    Ambulation/Gait Ambulation/Gait assistance: Min guard Gait Distance (Feet): 6 Feet (forwards and backwards,) Assistive device: Rolling walker (2 wheels) Gait Pattern/deviations: Step-to pattern Gait velocity: decr     General Gait Details: steady  at Rw,  Stairs            Wheelchair Mobility     Tilt Bed    Modified Rankin (Stroke Patients Only)       Balance Overall balance assessment: No apparent balance deficits (not formally assessed)                                           Pertinent Vitals/Pain Pain Assessment Pain Assessment: No/denies pain    Home Living Family/patient expects to be discharged to:: Private residence Living Arrangements: Spouse/significant other Available Help at Discharge: Family;Available PRN/intermittently Type of Home: House Home Access: Stairs to enter   Entrance Stairs-Number of Steps: 2   Home Layout: One level Home Equipment: Teacher, English as a foreign language (2 wheels);Cane - single point;Tub bench      Prior Function Prior Level of Function : Independent/Modified Independent  Hand Dominance   Dominant Hand: Right    Extremity/Trunk Assessment        Lower Extremity Assessment Lower Extremity Assessment: Generalized weakness    Cervical / Trunk Assessment Cervical / Trunk Assessment: Normal  Communication   Communication: No difficulties  Cognition Arousal/Alertness: Awake/alert Behavior During Therapy: WFL for tasks assessed/performed Overall Cognitive Status: Within Functional Limits for tasks  assessed                                 General Comments: speech is slow,drawled        General Comments      Exercises     Assessment/Plan    PT Assessment Patient needs continued PT services  PT Problem List Decreased strength;Decreased mobility;Cardiopulmonary status limiting activity;Decreased activity tolerance       PT Treatment Interventions DME instruction;Functional mobility training;Patient/family education;Gait training;Therapeutic activities    PT Goals (Current goals can be found in the Care Plan section)  Acute Rehab PT Goals Patient Stated Goal: to go home PT Goal Formulation: With patient Time For Goal Achievement: 03/14/23 Potential to Achieve Goals: Good    Frequency Min 1X/week     Co-evaluation               AM-PAC PT "6 Clicks" Mobility  Outcome Measure Help needed turning from your back to your side while in a flat bed without using bedrails?: A Little Help needed moving from lying on your back to sitting on the side of a flat bed without using bedrails?: A Little Help needed moving to and from a bed to a chair (including a wheelchair)?: A Little Help needed standing up from a chair using your arms (e.g., wheelchair or bedside chair)?: A Little Help needed to walk in hospital room?: Total Help needed climbing 3-5 steps with a railing? : Total 6 Click Score: 14    End of Session Equipment Utilized During Treatment: Oxygen Activity Tolerance: Patient tolerated treatment well Patient left: in chair;with call bell/phone within reach;with nursing/sitter in room Nurse Communication: Mobility status PT Visit Diagnosis: Difficulty in walking, not elsewhere classified (R26.2)    Time: 4098-1191 PT Time Calculation (min) (ACUTE ONLY): 25 min   Charges:   PT Evaluation $PT Eval Low Complexity: 1 Low PT Treatments $Gait Training: 8-22 mins PT General Charges $$ ACUTE PT VISIT: 1 Visit         Blanchard Kelch PT Acute  Rehabilitation Services Office [phone] Weekend pager-[phone]   Rada Hay 02/28/2023, 12:50 PM

## 2023-02-28 NOTE — Progress Notes (Signed)
   02/28/23 2055  Assess: MEWS Score  Temp 97.7 F (36.5 C)  BP 122/67  MAP (mmHg) 83  Pulse Rate (!) 110  Resp (!) 21  SpO2 94 %  O2 Device Nasal Cannula  Patient Activity (if Appropriate) In bed  O2 Flow Rate (L/min) 4 L/min  Assess: MEWS Score  MEWS Temp 0  MEWS Systolic 0  MEWS Pulse 1  MEWS RR 1  MEWS LOC 0  MEWS Score 2  MEWS Score Color Yellow  Assess: if the MEWS score is Yellow or Red  Were vital signs taken at a resting state? Yes  Focused Assessment No change from prior assessment  Does the patient meet 2 or more of the SIRS criteria? Yes  Does the patient have a confirmed or suspected source of infection? No  MEWS guidelines implemented  Yes, yellow  Treat  MEWS Interventions Considered administering scheduled or prn medications/treatments as ordered  Take Vital Signs  Increase Vital Sign Frequency  Yellow: Q2hr x1, continue Q4hrs until patient remains green for 12hrs  Escalate  MEWS: Escalate Yellow: Discuss with charge nurse and consider notifying provider and/or RRT  Notify: Charge Nurse/RN  Name of Charge Nurse/RN Notified Vanessa,RN  Assess: SIRS CRITERIA  SIRS Temperature  0  SIRS Pulse 1  SIRS Respirations  1  SIRS WBC 0  SIRS Score Sum  2

## 2023-02-28 NOTE — Progress Notes (Signed)
   NAME:  Kimberly Love, MRN:  [id number], DOB:  March 03, 1951, LOS: 4 ADMISSION DATE:  02/24/2023, CONSULTATION DATE:  02/24/23 REFERRING MD:  Estell Harpin - TRH, CHIEF COMPLAINT:  SOB   History of Present Illness:  72 yo PMH mood disorder HTN,  GERD, tobacco use, diastolic HF (last echo 2013) s/p cataract surgery  presented to [facility] ED 6/28 w SOB. Sounds like ongoing x 4d. Associated URI sx, cough, DOE. No fever chills. Hypoxic on EMS arrival w sats 70s. CTA chest with no PE, no pleural effusion, mild cardiomegaly, BLL bronchial wall thickening, scatred ASD RUL   She required BiPAP in ED and was accepted in admission to ICU   Pertinent  Medical History   HTN Cataracts Tobacco use ?GERD   Significant Hospital Events: Including procedures, antibiotic start and stop dates in addition to other pertinent events   6/28 [facility] ED to [facility] ICU  6/29 intubated 7/1 extubated. Code status confirmed DNR 7/2 transfer out of ICU   Interim History / Subjective:  BiPAP overnight  Objective   Blood pressure 104/69, pulse (!) 104, temperature (!) 97.4 F (36.3 C), temperature source Axillary, resp. rate 19, height 5' 6.5" (1.689 m), weight 118.1 kg, SpO2 96 %.    Vent Mode: BIPAP FiO2 (%):  [45 %] 45 % Set Rate:  [12 bmp] 12 bmp PEEP:  [6 cmH20] 6 cmH20 Pressure Support:  [6 cmH20] 6 cmH20   Intake/Output Summary (Last 24 hours) at 02/28/2023 1032 Last data filed at 02/28/2023 0300 Gross per 24 hour  Intake 372.99 ml  Output 1450 ml  Net -1077.01 ml   Filed Weights   02/26/23 0415 02/27/23 0500 02/28/23 0452  Weight: 122.4 kg 120.8 kg 118.1 kg    Examination: General: chronically ill obese elderly F NAD on BiPAP Neuro: AAOx3  HEENT: BiPAP in place w good seal. Anicteric sclera pink mm  CV: irir cap refill < 3 sec  Pulm: Symmetrical chest expansion. 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 Is rate controlled right now & in and out of fib. Defer cards cs for now.  -optmize lytes -cont cardiac monitoring  -pravastatin -add back home antihypertensive as appropriate   Hypokalemia -replace PRN  -AM BMP   Hyperglycemia -SSI  GOC/Code status DNR status -DNR status again confirmed 7/1.  -She states on admission she did not want elective intubation, but now in wake of husband's decision to pursue intubation w successful outcome she is open to this being an option in the future.    Best Practice (right click and "Reselect all SmartList Selections" daily)   Diet/type: adv diet  DVT prophylaxis: LMWH GI prophylaxis: PPI Lines: yes Foley:  no Code Status:  DNR  Last date of multidisciplinary goals of care discussion [7/1   Dispo: transfer out of ICU 7/2 will ask [facility] to take over  CCT n/a   Tessie Fass MSN, AGACNP-BC [facility] Pulmonary/Critical Care Medicine Amion for pager  02/28/2023, 10:32 AM

## 2023-02-28 NOTE — Progress Notes (Signed)
Pt placed on BIPAP 12/6 with 4 lpm bled in oxygen.  Tolerating well

## 2023-02-28 NOTE — Progress Notes (Signed)
Patient has tolerated clear liquids. No coughing or choking on assessment. Pt denied coughing, choking, N/V or abdominal discomfort. Diet advanced to mechanical soft

## 2023-02-28 NOTE — Progress Notes (Signed)
Initial Nutrition Assessment  DOCUMENTATION CODES:   Morbid obesity  INTERVENTION:  - DYS 3 diet per MD. - Ensure Max po BID, each supplement provides 150 kcal and 30 grams of protein.    NUTRITION DIAGNOSIS:   Increased nutrient needs related to acute illness as evidenced by estimated needs.  GOAL:   Patient will meet greater than or equal to 90% of their needs  MONITOR:   PO intake, Weight trends, Diet advancement  REASON FOR ASSESSMENT:   Consult (6/30) Enteral/tube feeding initiation and management  ASSESSMENT:   72 yo female PMH mood disorder, HTN, GERD, tobacco use, diastolic HF who presented for SOB.   6/28 Admit 6/29 Intubated 6/30 TF of Vital 1.2 started 7/1 Extubated  Patient unsure of a UBW but guesses it is around 250#. Reports she has maybe lost around 10# over the past few weeks but unsure as to why. Per EMR, no weight history prior to this admission since 2021.  She endorses eating well at home prior to admit. Usually has 2 meals a day with good appetite.  Current appetite noted to be a little decreased but feeling good about having her diet advanced today. She is agreeable to try nutrition supplements during admission.    Medications reviewed and include: Colace, Miralax, Insulin  Labs reviewed:  HA1C 6.2 Blood Glucose 96-187 x24 hours   NUTRITION - FOCUSED PHYSICAL EXAM:  Flowsheet Row Most Recent Value  Orbital Region No depletion  Upper Arm Region No depletion  Thoracic and Lumbar Region No depletion  Buccal Region No depletion  Temple Region No depletion  Clavicle Bone Region No depletion  Clavicle and Acromion Bone Region No depletion  Scapular Bone Region Unable to assess  Dorsal Hand No depletion  Patellar Region No depletion  Anterior Thigh Region No depletion  Posterior Calf Region No depletion  Edema (RD Assessment) Mild  Hair Reviewed  Eyes Reviewed  Mouth Reviewed  Skin Reviewed  Nails Reviewed       Diet Order:    Diet Order             DIET DYS 3 Room service appropriate? Yes; Fluid consistency: Thin  Diet effective now                   EDUCATION NEEDS:  Education needs have been addressed  Skin:  Skin Assessment: Skin Integrity Issues: Skin Integrity Issues:: Stage I Stage I: Buttocks  Last BM:  6/28  Height:  Ht Readings from Last 1 Encounters:  02/24/23 5' 6.5" (1.689 m)   Weight:  Wt Readings from Last 1 Encounters:  02/28/23 118.1 kg    BMI:  Body mass index is 41.39 kg/m.  Estimated Nutritional Needs:  Kcal:  2000-2250 kcals Protein:  100-120 grams Fluid:  >/= 2L    Shelle Iron RD, LDN For contact information, refer to [facility].

## 2023-02-28 NOTE — Evaluation (Signed)
Occupational Therapy Evaluation Patient Details Name: Kimberly Love MRN: [id number] DOB: Feb 04, 1951 Today's Date: 02/28/2023   History of Present Illness 72 yo who presented to  ED 6/28 w SOB. Sounds like ongoing x 4d. Associated URI sx, cough, DOE. No fever chills. Hypoxic on EMS arrival w sats 70s. CTA chest with no PE, no pleural effusion, mild cardiomegaly, BLL bronchial wall thickening, scatred ASD RUL.PMH mood disorder HTN, GERD, tobacco use, diastolic HF (last echo 2013) s/p cataract surgery presented to [facility]   Clinical Impression   Patient is a 72 year old female who was admitted for above. Patient was living at home with spouse with independence in ADLs with no AD per patient report. Currently, patient was min A for transfers with increased A for proper positioning, increased A for LB Dressing/bathing tasks impacting patients independence in ADLs. Patient was on 5L/min during session maintaining 90s.  Patient was noted to have decreased functional activity tolerance, decreased endurance, decreased standing balance, decreased safety awareness, and decreased knowledge of AD/AE impacting participation in ADLs. Patient would continue to benefit from skilled OT services at this time while admitted and after d/c to address noted deficits in order to improve overall safety and independence in ADLs.       Recommendations for follow up therapy are one component of a multi-disciplinary discharge planning process, led by the attending physician.  Recommendations may be updated based on patient status, additional functional criteria and insurance authorization.   Assistance Recommended at Discharge Frequent or constant Supervision/Assistance  Patient can return home with the following A lot of help with bathing/dressing/bathroom;A lot of help with walking and/or transfers;Direct supervision/assist for financial management;Help with stairs or ramp for entrance;Assist for transportation;Direct  supervision/assist for medications management;Assistance with cooking/housework    Functional Status Assessment  Patient has had a recent decline in their functional status and demonstrates the ability to make significant improvements in function in a reasonable and predictable amount of time.  Equipment Recommendations  None recommended by OT       Precautions / Restrictions Precautions Precautions: Fall Precaution Comments: watch sats, HR Restrictions Weight Bearing Restrictions: No      Mobility Bed Mobility Overal bed mobility: Needs Assistance Bed Mobility: Supine to Sit     Supine to sit: Min assist     General bed mobility comments: HOB raised with increased A.           Balance Overall balance assessment: No apparent balance deficits (not formally assessed)           ADL either performed or assessed with clinical judgement   ADL Overall ADL's : Needs assistance/impaired Eating/Feeding: Modified independent;Sitting   Grooming: Moderate assistance Grooming Details (indicate cue type and reason): noted knots in hair and Iv lines on R side of neck with increased education on not brushing on that side to avoid lines being pulled. therapist assisted with combing near IV sites. Upper Body Bathing: Minimal assistance;Sitting   Lower Body Bathing: Maximal assistance;Sitting/lateral leans;Sit to/from stand   Upper Body Dressing : Min guard;Minimal assistance;Sitting   Lower Body Dressing: Sitting/lateral leans;Sit to/from stand;Maximal assistance   Toilet Transfer: Minimal assistance;Rolling walker (2 wheels);Stand-pivot Statistician Details (indicate cue type and reason): to recliner with RW to hold onto Toileting- Clothing Manipulation and Hygiene: Maximal assistance;Sit to/from stand               Vision Baseline Vision/History: 1 Wears glasses           Hand Dominance  Right   Extremity/Trunk Assessment Upper Extremity Assessment Upper  Extremity Assessment: Generalized weakness;LUE deficits/detail;RUE deficits/detail RUE Deficits / Details: soft tissue mass noted to limite lifting hands up top of head bilaterally. patient able to touch top of head but not sustain positioning to participate in combing hair. RUE Coordination: decreased gross motor LUE Deficits / Details: soft tissue mass noted to limite lifting hands up top of head bilaterally. patient able to touch top of head but not sustain positioning to participate in combing hair. LUE Coordination: decreased gross motor   Lower Extremity Assessment Lower Extremity Assessment: Defer to PT evaluation   Cervical / Trunk Assessment Cervical / Trunk Assessment: Normal   Communication Communication Communication: No difficulties   Cognition Arousal/Alertness: Awake/alert Behavior During Therapy: WFL for tasks assessed/performed Overall Cognitive Status: Within Functional Limits for tasks assessed     General Comments: speech is slow,drawled                Home Living Family/patient expects to be discharged to:: Private residence Living Arrangements: Spouse/significant other Available Help at Discharge: Family;Available PRN/intermittently Type of Home: House Home Access: Stairs to enter Entrance Stairs-Number of Steps: 2   Home Layout: One level     Bathroom Shower/Tub: Tub/shower unit         Home Equipment: Teacher, English as a foreign language (2 wheels);Cane - single point;Tub bench          Prior Functioning/Environment Prior Level of Function : Independent/Modified Independent              OT Problem List: Decreased activity tolerance;Impaired balance (sitting and/or standing);Obesity;Decreased safety awareness;Decreased knowledge of precautions;Decreased knowledge of use of DME or AE      OT Treatment/Interventions: Self-care/ADL training;Energy conservation;Therapeutic exercise;DME and/or AE instruction;Therapeutic activities;Patient/family  education;Balance training    OT Goals(Current goals can be found in the care plan section) Acute Rehab OT Goals Patient Stated Goal: to get back home OT Goal Formulation: With patient Time For Goal Achievement: 03/14/23 Potential to Achieve Goals: Fair  OT Frequency: Min 1X/week       AM-PAC OT "6 Clicks" Daily Activity     Outcome Measure Help from another person eating meals?: A Little Help from another person taking care of personal grooming?: A Lot Help from another person toileting, which includes using toliet, bedpan, or urinal?: A Lot Help from another person bathing (including washing, rinsing, drying)?: A Lot Help from another person to put on and taking off regular upper body clothing?: A Little Help from another person to put on and taking off regular lower body clothing?: A Lot 6 Click Score: 14   End of Session Equipment Utilized During Treatment: Rolling walker (2 wheels) Nurse Communication: Mobility status  Activity Tolerance: Patient tolerated treatment well Patient left: in chair;with call bell/phone within reach;with chair alarm set  OT Visit Diagnosis: Unsteadiness on feet (R26.81);Other abnormalities of gait and mobility (R26.89);History of falling (Z91.81)                Time: 1610-9604 OT Time Calculation (min): 21 min Charges:  OT General Charges $OT Visit: 1 Visit OT Evaluation $OT Eval Moderate Complexity: 1 Mod  [place] Grosser OTR/L, MS Acute Rehabilitation Department Office# [phone]   Selinda Flavin 02/28/2023, 1:14 PM

## 2023-02-28 NOTE — Progress Notes (Signed)
Left internal jugular removal performed. Patient tolerated well. Dressing applied, clean dry intact.   02/28/23 1600  Vitals  BP 116/78  MAP (mmHg) 84  BP Location Right Arm  BP Method Automatic  Patient Position (if appropriate) Lying  Pulse Rate (!) 108  ECG Heart Rate (!) 110  Resp 16  Oxygen Therapy  SpO2 92 %  O2 Device HFNC  O2 Flow Rate (L/min) 5 L/min  MEWS Score  MEWS Temp 0  MEWS Systolic 0  MEWS Pulse 1  MEWS RR 0  MEWS LOC 0  MEWS Score 1  MEWS Score Color [place] Si

## 2023-03-01 DIAGNOSIS — J9602 Acute respiratory failure with hypercapnia: Secondary | ICD-10-CM | POA: Diagnosis not present

## 2023-03-01 LAB — CBC
HCT: 44.6 % (ref 36.0–46.0)
Hemoglobin: 13.3 g/dL (ref 12.0–15.0)
MCH: 29.3 pg (ref 26.0–34.0)
MCHC: 29.8 g/dL — ABNORMAL LOW (ref 30.0–36.0)
MCV: 98.2 fL (ref 80.0–100.0)
Platelets: 238 10*3/uL (ref 150–400)
RBC: 4.54 MIL/uL (ref 3.87–5.11)
RDW: 16.2 % — ABNORMAL HIGH (ref 11.5–15.5)
WBC: 11 10*3/uL — ABNORMAL HIGH (ref 4.0–10.5)
nRBC: 0 % (ref 0.0–0.2)

## 2023-03-01 LAB — GLUCOSE, CAPILLARY
Glucose-Capillary: 129 mg/dL — ABNORMAL HIGH (ref 70–99)
Glucose-Capillary: 123 mg/dL — ABNORMAL HIGH (ref 70–99)
Glucose-Capillary: 163 mg/dL — ABNORMAL HIGH (ref 70–99)
Glucose-Capillary: 116 mg/dL — ABNORMAL HIGH (ref 70–99)
Glucose-Capillary: 151 mg/dL — ABNORMAL HIGH (ref 70–99)

## 2023-03-01 LAB — BASIC METABOLIC PANEL
Anion gap: 11 (ref 5–15)
BUN: 10 mg/dL (ref 8–23)
CO2: 36 mmol/L — ABNORMAL HIGH (ref 22–32)
Calcium: 8.6 mg/dL — ABNORMAL LOW (ref 8.9–10.3)
Chloride: 94 mmol/L — ABNORMAL LOW (ref 98–111)
Creatinine, Ser: 0.49 mg/dL (ref 0.44–1.00)
GFR, Estimated: >60 mL/min (ref >60)
Glucose, Bld: 122 mg/dL — ABNORMAL HIGH (ref 70–99)
Potassium: 3.8 mmol/L (ref 3.5–5.1)
Sodium: 141 mmol/L (ref 135–145)

## 2023-03-01 LAB — CULTURE, BLOOD (ROUTINE X 2): Special Requests: ADEQUATE

## 2023-03-01 LAB — MAGNESIUM: Magnesium: 2.4 mg/dL (ref 1.7–2.4)

## 2023-03-01 MED ORDER — IPRATROPIUM-ALBUTEROL 0.5-2.5 (3) MG/3ML IN SOLN
3.0000 mL | Freq: Three times a day (TID) | RESPIRATORY_TRACT | Status: DC
  Administered 2023-03-02 – 2023-03-08 (×17): 3 mL via RESPIRATORY_TRACT
  Filled 2023-03-01 (×4): qty 3
  Filled 2023-03-01: qty 42
  Filled 2023-03-01 (×14): qty 3

## 2023-03-01 MED ORDER — ESCITALOPRAM OXALATE 20 MG PO TABS
20.0000 mg | ORAL_TABLET | Freq: Every day | ORAL | Status: DC
  Administered 2023-03-01 – 2023-03-11 (×11): 20 mg via ORAL
  Filled 2023-03-01 (×9): qty 1
  Filled 2023-03-01: qty 2
  Filled 2023-03-01: qty 1

## 2023-03-01 MED ORDER — PANTOPRAZOLE SODIUM 40 MG PO TBEC
40.0000 mg | DELAYED_RELEASE_TABLET | Freq: Every day | ORAL | Status: DC
  Administered 2023-03-02 – 2023-03-11 (×10): 40 mg via ORAL
  Filled 2023-03-01 (×10): qty 1

## 2023-03-01 MED ORDER — SODIUM CHLORIDE 0.9 % IV SOLN: INTRAVENOUS | Status: DC

## 2023-03-01 NOTE — Progress Notes (Signed)
Physical Therapy Treatment Patient Details Name: Kimberly Love MRN: [id number] DOB: 07-16-1951 Today's Date: 03/01/2023   History of Present Illness 72 yo who presented to  ED 6/28 w SOB. Sounds like ongoing x 4d. Associated URI sx, cough, DOE. No fever chills. Hypoxic on EMS arrival w sats 70s. CTA chest with no PE, no pleural effusion, mild cardiomegaly, BLL bronchial wall thickening, scatred ASD RUL.PMH mood disorder HTN, GERD, tobacco use, diastolic HF (last echo 2013) s/p cataract surgery presented to [facility]    PT Comments  General Comments: AxO x 3 very pleasant Lady.  Lives home with Spouse and was Indep. "Last time I was in the hospital was 2013 for a knee replacement by Dr Sherlean Foot"   Pt unsure/unaware of her current medical condiiton.  Feels weak.  Knows she is sick. Pt in bed with rest HR 133.  On 4 lts sats 92.  Assisted OOB to amb required + 2 asisst for safety/lines/equipment/recliner but pt was able to stand + 1 assist.  General transfer comment: no support, assist with lines, stood x 2, using forward momentum.  Also asissted with a toilet transfer.  Incont B/B.  "I pee a little when I cough".  General Gait Details: limited amb distance due to weakness/effort/dyspnea along with coughing.  Pt was able to amb 4 feet forward just to the door then requested to sit and rest.  Then second time, amb 3 feet baclkward to [facility].  Remained on 4 lts with avg sats 88% and HR 134. Then assisted to [facility] as pt was incont urine with activity/coughing.  Assisted with peri care as pt stood at Supervision level.  Then assisted to recliner.   Pt plans to return home with Spouse when medically stable.  Will need HH PT and NO equipment.       Assistance Recommended at Discharge Intermittent Supervision/Assistance  If plan is discharge home, recommend the following:  Can travel by private vehicle    A little help with walking and/or transfers;A little help with bathing/dressing/bathroom;Help with stairs or ramp  for entrance;Assistance with cooking/housework;Assist for transportation      Equipment Recommendations  None recommended by PT    Recommendations for Other Services       Precautions / Restrictions Precautions Precautions: Fall Precaution Comments: monitor vitals Restrictions Weight Bearing Restrictions: No     Mobility  Bed Mobility Overal bed mobility: Needs Assistance Bed Mobility: Supine to Sit     Supine to sit: Mod assist     General bed mobility comments: assist for upper body and increased time to scoot to EOB due to weakness and body habitus.    Transfers Overall transfer level: Needs assistance Equipment used: Rolling walker (2 wheels) Transfers: Sit to/from Stand Sit to Stand: Supervision, Min guard           General transfer comment: no support, assist with lines, stood x 2, using forward momentum.  Also asissted with a toilet transfer.  Incont B/B.  "I pee a little when I cough"    Ambulation/Gait Ambulation/Gait assistance: Min guard, Min assist Gait Distance (Feet): 7 Feet (4 feet forward, then 3 feet backward) Assistive device: Rolling walker (2 wheels) Gait Pattern/deviations: Step-to pattern, Trunk flexed, Wide base of support Gait velocity: decreased     General Gait Details: limited amb distance due to weakness/effort/dyspnea along with coughing.  Pt was able to amb 4 feet forward just to the door then requested to sit and rest.  Then second time, amb  3 feet baclkward to BSC.  Remained on 4 lts with avg sats 88% and HR 134.   Stairs             Wheelchair Mobility     Tilt Bed    Modified Rankin (Stroke Patients Only)       Balance                                            Cognition Arousal/Alertness: Awake/alert Behavior During Therapy: WFL for tasks assessed/performed Overall Cognitive Status: Within Functional Limits for tasks assessed                                 General  Comments: AxO x 3 very pleasant Lady.  Lives home with Spouse and was Indep. "Last time I was in the hospital was 2013 for a knee replacement by Dr Sherlean Foot"   Pt unsure/unaware of her current medical condiiton.  Feels weak.  Knows she is sick.        Exercises      General Comments        Pertinent Vitals/Pain Pain Assessment Pain Assessment: No/denies pain    Home Living                          Prior Function            PT Goals (current goals can now be found in the care plan section) Progress towards PT goals: Progressing toward goals    Frequency    Min 1X/week      PT Plan Current plan remains appropriate    Co-evaluation              AM-PAC PT "6 Clicks" Mobility   Outcome Measure  Help needed turning from your back to your side while in a flat bed without using bedrails?: A Little Help needed moving from lying on your back to sitting on the side of a flat bed without using bedrails?: A Little Help needed moving to and from a bed to a chair (including a wheelchair)?: A Little Help needed standing up from a chair using your arms (e.g., wheelchair or bedside chair)?: A Little Help needed to walk in hospital room?: A Lot Help needed climbing 3-5 steps with a railing? : Total 6 Click Score: 15    End of Session Equipment Utilized During Treatment: Gait belt;Oxygen Activity Tolerance: Patient limited by fatigue Patient left: in chair;with call bell/phone within reach;with chair alarm set Nurse Communication: Mobility status PT Visit Diagnosis: Difficulty in walking, not elsewhere classified (R26.2)     Time: 1415-1440 PT Time Calculation (min) (ACUTE ONLY): 25 min  Charges:    $Gait Training: 8-22 mins $Therapeutic Activity: 8-22 mins PT General Charges $$ ACUTE PT VISIT: 1 Visit                     Felecia Shelling  PTA Acute  Rehabilitation Services Office M-F          [phone]

## 2023-03-01 NOTE — Progress Notes (Addendum)
PROGRESS NOTE    Kimberly Love  ZOX:[id number]  DOB: 07-30-1951  DOA: 02/24/2023 PCP: Benita Stabile, MD Outpatient Specialists:   Hospital course: 72 yo PMH mood disorder HTN,  GERD, tobacco use, diastolic HF (last echo 2013) s/p cataract surgery was admitted with acute on chronic hypoxic respiratory failure requiring intubation.  Patient was treated in ICU and workup ultimately revealed parainfluenza virus pneumonia.  She was ruled out for pulmonary emboli.  Patient was extubated on 7/1 and her CODE STATUS is confirmed to be DNR.  Patient was transferred out of ICU with improvement in respiratory function.   Subjective:  Patient states that she feels okay, is tired.  Notes "I quit smoking the day I came in".    Objective: Vitals:   03/01/23 0743 03/01/23 0905 03/01/23 1229 03/01/23 1323  BP:  135/74 (!) 143/70   Pulse:  (!) 118 (!) 115   Resp:  20 16   Temp:  98.3 F (36.8 C) 98.4 F (36.9 C)   TempSrc:  Oral Oral   SpO2: 92% 91% 94% 93%  Weight:      Height:        Intake/Output Summary (Last 24 hours) at 03/01/2023 1732 Last data filed at 03/01/2023 0518 Gross per 24 hour  Intake 100 ml  Output 200 ml  Net -100 ml   Filed Weights   02/27/23 0500 02/28/23 0452 03/01/23 0500  Weight: 120.8 kg 118.1 kg 121.2 kg     Exam:  General: Chronically ill-appearing female sitting up in bed in NAD. Eyes: sclera anicteric, conjuctiva mild injection bilaterally CVS: S1-S2, regular  Respiratory:  decreased air entry bilaterally, no wheezes GI: NABS, soft, NT  LE: Warm and well-perfused Neuro: A/O x 3,  grossly nonfocal.  Psych: patient is logical and coherent, judgement and insight appear normal, mood and affect appropriate to situation.  Data Reviewed:  Basic Metabolic Panel: Recent Labs  Lab 02/25/23 0544 02/26/23 0405 02/27/23 0533 02/28/23 0449 03/01/23 0523  NA 134* 139 138 142 141  K 4.7 3.5 2.6* 3.7 3.8  CL 98 96* 92* 95* 94*  CO2 27 33* 37* 38* 36*   GLUCOSE 127* 140* 204* 116* 122*  BUN 23 29* 20 14 10   CREATININE 0.53 0.70 0.51 0.46 0.49  CALCIUM 8.4* 8.5* 8.4* 8.4* 8.6*  MG  --  2.1 2.2 2.2 2.4    CBC: Recent Labs  Lab 02/24/23 1103 02/25/23 0325 02/27/23 0533 02/28/23 0449 03/01/23 0523  WBC 17.2* 27.3* 12.6* 10.7* 11.0*  NEUTROABS 15.4*  --   --   --   --   HGB 14.1 13.7 12.3 12.2 13.3  HCT 45.3 45.1 39.6 41.0 44.6  MCV 94.2 97.8 93.0 96.9 98.2  PLT 218 202 254 225 238     Scheduled Meds:  docusate sodium  100 mg Oral BID   enoxaparin (LOVENOX) injection  120 mg Subcutaneous Q12H   ipratropium-albuterol  3 mL Nebulization Q6H   nicotine  14 mg Transdermal Daily   mouth rinse  15 mL Mouth Rinse 4 times per day   [START ON 03/02/2023] pantoprazole  40 mg Oral Daily   pneumococcal 20-valent conjugate vaccine  0.5 mL Intramuscular Tomorrow-1000   polyethylene glycol  17 g Oral Daily   pravastatin  10 mg Oral q1800   Ensure Max Protein  11 oz Oral BID   Continuous Infusions:  sodium chloride Stopped (02/27/23 1558)     Assessment & Plan:   Acute on chronic  hypoxic respiratory failure Parainfluenza virus pneumonia Likely OSA 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 Cardiology consult in the morning as warranted  Decreased urine output Per RN, patient is only put out 125 cc of urine this shift Will start NS at 150 an hour and follow creatinine and urine output closely  HTN Continue to hold HCTZ/lisinopril for now Patient may benefit from initiation of beta-blocker if warranted  Hyperglycemia without previous diagnosis of DM Patient meets criteria  for diabetes mellitus given single glucose of 204 Sugars are presently well-controlled on SSI  Hemoglobin A1c 6.2 on admission  Anxiety and depression Restart Escitalopram    DVT prophylaxis: Lovenox Code Status: DNR Family Communication: None today   Studies: No results found.  Principal Problem:   Acute respiratory acidosis (HCC) Active Problems:   Acute respiratory failure (HCC)   Acute respiratory failure with hypoxia and hypercapnia (HCC)   DNR (do not resuscitate)   Goals of care, counseling/discussion   Pressure injury of skin   Parainfluenza infection     Kimberly Love Kimberly Love Kimberly Love, [facility]  If 7PM-7AM, please contact night-coverage [email or web address]   LOS: 5 days

## 2023-03-01 NOTE — TOC Initial Note (Signed)
Transition of Care [facility]) - Initial/Assessment Note    Patient Details  Name: Kimberly Love MRN: [id number] Date of Birth: 12-19-1950  Transition of Care [facility]) CM/SW Contact:    Howell Rucks, RN Phone Number: 03/01/2023, 1:45 PM  Clinical Narrative: Met with pt at bedside to introduce role of TOC/NCM and review for dc planning, PT recommendation for [facility] PT, pt agreeable, no preference. Pt reports she has a PCP and pharmacy in place, no in home care services, has walker and cane at home, pt reports she has transportation available at discharge. TOC will continue to follow.                   Expected Discharge Plan: Home w Home Health Services Barriers to Discharge: Continued Medical Work up   Patient Goals and CMS Choice Patient states their goals for this hospitalization and ongoing recovery are:: Home with [facility] PT CMS Medicare.gov Compare Post Acute Care list provided to:: Patient Choice offered to / list presented to : Patient      Expected Discharge Plan and Services   Discharge Planning Services: CM Consult   Living arrangements for the past 2 months: Single Family Home                                      Prior Living Arrangements/Services Living arrangements for the past 2 months: Single Family Home Lives with:: Spouse Patient language and need for interpreter reviewed:: Yes Do you feel safe going back to the place where you live?: Yes      Need for Family Participation in Patient Care: Yes (Comment) Care giver support system in place?: Yes (comment) Current home services: DME (walker, cane) Criminal Activity/Legal Involvement Pertinent to Current Situation/Hospitalization: No - Comment as needed  Activities of Daily Living Home Assistive Devices/Equipment: None ADL Screening (condition at time of admission) Patient's cognitive ability adequate to safely complete daily activities?: Yes Is the patient deaf or have difficulty hearing?: No Does the  patient have difficulty seeing, even when wearing glasses/contacts?: Yes Does the patient have difficulty concentrating, remembering, or making decisions?: No Patient able to express need for assistance with ADLs?: Yes Does the patient have difficulty dressing or bathing?: No Independently performs ADLs?: Yes (appropriate for developmental age) Does the patient have difficulty walking or climbing stairs?: Yes Weakness of Legs: Both Weakness of Arms/Hands: None  Permission Sought/Granted Permission sought to share information with : Case Manager Permission granted to share information with : Yes, Verbal Permission Granted  Share Information with NAME: Fannie Knee, RN           Emotional Assessment Appearance:: Appears stated age Attitude/Demeanor/Rapport: Gracious Affect (typically observed): Accepting Orientation: : Oriented to Self, Oriented to Place, Oriented to  Time, Oriented to Situation Alcohol / Substance Use: Not Applicable Psych Involvement: No (comment)  Admission diagnosis:  Acute respiratory failure (HCC) [J96.00] Sinus tachycardia [R00.0] Acute respiratory acidosis (HCC) [J96.02] Sepsis with acute hypercapnic respiratory failure without septic shock, due to unspecified organism (HCC) [A41.9, R65.20, J96.02] Acute respiratory failure with hypoxia and hypercapnia (HCC) [J96.01, J96.02] Patient Active Problem List   Diagnosis Date Noted   DNR (do not resuscitate) 02/27/2023   Goals of care, counseling/discussion 02/27/2023   Pressure injury of skin 02/27/2023   Parainfluenza infection 02/27/2023   Acute respiratory acidosis (HCC) 02/24/2023   Acute respiratory failure with hypoxia and hypercapnia (HCC) 02/24/2023  Hx of colonic polyps 10/17/2018   Special screening for malignant neoplasms, colon 07/17/2017   Difficulty in walking(719.7) 07/31/2012   Knee pain 07/31/2012   Knee stiffness 07/31/2012   Respiratory acidosis 12/28/2011   Acute respiratory failure  (HCC) 12/28/2011   Altered mental status 12/28/2011   Hypoxemia 12/28/2011   PCP:  Benita Stabile, MD Pharmacy:   [facility] [phone] - [place], [place] - [address] AT [facility] [address] [place] [address] Phone: [phone] Fax: [phone]     Social Determinants of Health (SDOH) Social History: SDOH Screenings   Food Insecurity: No Food Insecurity (02/24/2023)  Housing: Low Risk  (02/24/2023)  Transportation Needs: No Transportation Needs (02/24/2023)  Utilities: Not At Risk (02/24/2023)  Tobacco Use: High Risk (02/24/2023)   SDOH Interventions:     Readmission Risk Interventions    03/01/2023    1:36 PM  Readmission Risk Prevention Plan  Post Dischage Appt Complete  Medication Screening Complete  Transportation Screening Complete

## 2023-03-01 NOTE — Progress Notes (Signed)
   03/01/23 2311  BiPAP/CPAP/SIPAP  $ Face Mask Medium Yes  BiPAP/CPAP/SIPAP Pt Type Adult  BiPAP/CPAP/SIPAP DREAMSTATIOND  Mask Type Full face mask  Mask Size Medium  Respiratory Rate 20 breaths/min  IPAP 12 cmH20  EPAP 6 cmH2O  Flow Rate 5 lpm  Patient Home Equipment No  Auto Titrate No  CPAP/SIPAP surface wiped down Yes  BiPAP/CPAP /SiPAP Vitals  Pulse Rate (!) 120  Resp 20  SpO2 93 %

## 2023-03-02 ENCOUNTER — Inpatient Hospital Stay (HOSPITAL_COMMUNITY): Payer: 59

## 2023-03-02 DIAGNOSIS — J9602 Acute respiratory failure with hypercapnia: Secondary | ICD-10-CM | POA: Diagnosis not present

## 2023-03-02 LAB — BASIC METABOLIC PANEL
Anion gap: 11 (ref 5–15)
BUN: 9 mg/dL (ref 8–23)
CO2: 33 mmol/L — ABNORMAL HIGH (ref 22–32)
Calcium: 8.8 mg/dL — ABNORMAL LOW (ref 8.9–10.3)
Chloride: 95 mmol/L — ABNORMAL LOW (ref 98–111)
Creatinine, Ser: 0.34 mg/dL — ABNORMAL LOW (ref 0.44–1.00)
GFR, Estimated: >60 mL/min (ref >60)
Glucose, Bld: 123 mg/dL — ABNORMAL HIGH (ref 70–99)
Potassium: 4.1 mmol/L (ref 3.5–5.1)
Sodium: 139 mmol/L (ref 135–145)

## 2023-03-02 LAB — CBC
HCT: 45.2 % (ref 36.0–46.0)
Hemoglobin: 13.2 g/dL (ref 12.0–15.0)
MCH: 28.9 pg (ref 26.0–34.0)
MCHC: 29.2 g/dL — ABNORMAL LOW (ref 30.0–36.0)
MCV: 98.9 fL (ref 80.0–100.0)
Platelets: 214 10*3/uL (ref 150–400)
RBC: 4.57 MIL/uL (ref 3.87–5.11)
RDW: 16 % — ABNORMAL HIGH (ref 11.5–15.5)
WBC: 11.2 10*3/uL — ABNORMAL HIGH (ref 4.0–10.5)
nRBC: 0 % (ref 0.0–0.2)

## 2023-03-02 LAB — GLUCOSE, CAPILLARY
Glucose-Capillary: 127 mg/dL — ABNORMAL HIGH (ref 70–99)
Glucose-Capillary: 127 mg/dL — ABNORMAL HIGH (ref 70–99)
Glucose-Capillary: 139 mg/dL — ABNORMAL HIGH (ref 70–99)
Glucose-Capillary: 185 mg/dL — ABNORMAL HIGH (ref 70–99)

## 2023-03-02 LAB — MAGNESIUM: Magnesium: 2.5 mg/dL — ABNORMAL HIGH (ref 1.7–2.4)

## 2023-03-02 LAB — BRAIN NATRIURETIC PEPTIDE: B Natriuretic Peptide: 177.7 pg/mL — ABNORMAL HIGH (ref 0.0–100.0)

## 2023-03-02 MED ORDER — DIGOXIN 0.25 MG/ML IJ SOLN
0.2500 mg | Freq: Every day | INTRAMUSCULAR | Status: AC
  Administered 2023-03-02 – 2023-03-03 (×2): 0.25 mg via INTRAVENOUS
  Filled 2023-03-02 (×2): qty 2

## 2023-03-02 MED ORDER — FUROSEMIDE 10 MG/ML IJ SOLN
40.0000 mg | Freq: Every day | INTRAMUSCULAR | Status: DC
  Administered 2023-03-02 – 2023-03-04 (×3): 40 mg via INTRAVENOUS
  Filled 2023-03-02 (×3): qty 4

## 2023-03-02 MED ORDER — FUROSEMIDE 10 MG/ML IJ SOLN
20.0000 mg | Freq: Once | INTRAMUSCULAR | Status: AC
  Administered 2023-03-02: 20 mg via INTRAVENOUS
  Filled 2023-03-02: qty 2

## 2023-03-02 MED ORDER — DILTIAZEM HCL-DEXTROSE 125-5 MG/125ML-% IV SOLN (PREMIX)
5.0000 mg/h | INTRAVENOUS | Status: DC
  Administered 2023-03-02: 5 mg/h via INTRAVENOUS
  Filled 2023-03-02: qty 125

## 2023-03-02 NOTE — Progress Notes (Signed)
    Patient Name: Kimberly Love           DOB: 03/03/1951  MRN: [id number]      Admission Date: 02/24/2023  Attending Provider: Leandro Reasoner Tubl*  Primary Diagnosis: Acute respiratory acidosis (HCC)   Level of care: Progressive    CROSS COVER NOTE   Date of Service   03/02/2023   Kimberly Love, 72 y.o. female, was admitted on 02/24/2023 for Acute respiratory acidosis (HCC).    HPI/Events of Note   Sudden dyspnea at rest, bilateral lung crackles.  No pitting edema reported. Known history of diastolic heart failure.  Currently receiving IV  fluids at 150 cc/hr.  Fluids discontinued. Chest x-ray,  BNP, IV Lasix ordered.   Chest x-ray-evidence of vascular congestion with small effusion and right basilar atelectasis   Interventions/ Plan   Above        Anthoney Harada, DNP, [facility]- [facility]

## 2023-03-02 NOTE — Progress Notes (Signed)
PROGRESS NOTE    Kimberly Love  ZOX:[id number]  DOB: 1951/01/22  DOA: 02/24/2023 PCP: Benita Stabile, MD Outpatient Specialists:   Hospital course: 72 yo PMH mood disorder HTN,  GERD, tobacco use, diastolic HF (last echo 2013) s/p cataract surgery was admitted with acute on chronic hypoxic respiratory failure requiring intubation.  Patient was treated in ICU and workup ultimately revealed parainfluenza virus pneumonia.  She was ruled out for pulmonary emboli.  Patient was extubated on 7/1 and her CODE STATUS is confirmed to be DNR.  Patient was transferred out of ICU with improvement in respiratory function.   Subjective:  Patient states that she developed shortness of breath earlier this morning and feels better with the Lasix.  However notes that she is still short of breath, worse than she was yesterday.  Objective: Vitals:   03/02/23 0809 03/02/23 0900 03/02/23 1056 03/02/23 1107  BP:    (!) 152/78  Pulse:    99  Resp:    18  Temp:    98.1 F (36.7 C)  TempSrc:    Oral  SpO2: 95% (!) 87% 92% 94%  Weight:      Height:        Intake/Output Summary (Last 24 hours) at 03/02/2023 1211 Last data filed at 03/02/2023 1018 Gross per 24 hour  Intake 1460.49 ml  Output 2600 ml  Net -1139.51 ml    Filed Weights   02/28/23 0452 03/01/23 0500 03/02/23 0500  Weight: 118.1 kg 121.2 kg 121.2 kg     Exam:  General: Chronically ill-appearing female sitting up in bed in NAD. Eyes: sclera anicteric, conjuctiva mild injection bilaterally CVS: S1-S2, irregular  Respiratory: Lungs with transmitted upper respiratory sounds, bronchial in nature but they do sound wet, no clear rales or wheezes. GI: NABS, soft, NT  LE: Warm and well-perfused Neuro: A/O x 3,  grossly nonfocal.  Psych: patient is logical and coherent, judgement and insight appear normal, mood and affect appropriate to situation.  Data Reviewed:  Basic Metabolic Panel: Recent Labs  Lab 02/26/23 0405  02/27/23 0533 02/28/23 0449 03/01/23 0523 03/02/23 0436  NA 139 138 142 141 139  K 3.5 2.6* 3.7 3.8 4.1  CL 96* 92* 95* 94* 95*  CO2 33* 37* 38* 36* 33*  GLUCOSE 140* 204* 116* 122* 123*  BUN 29* 20 14 10 9   CREATININE 0.70 0.51 0.46 0.49 0.34*  CALCIUM 8.5* 8.4* 8.4* 8.6* 8.8*  MG 2.1 2.2 2.2 2.4 2.5*     CBC: Recent Labs  Lab 02/24/23 1103 02/25/23 0325 02/27/23 0533 02/28/23 0449 03/01/23 0523 03/02/23 0436  WBC 17.2* 27.3* 12.6* 10.7* 11.0* 11.2*  NEUTROABS 15.4*  --   --   --   --   --   HGB 14.1 13.7 12.3 12.2 13.3 13.2  HCT 45.3 45.1 39.6 41.0 44.6 45.2  MCV 94.2 97.8 93.0 96.9 98.2 98.9  PLT 218 202 254 225 238 214      Scheduled Meds:  docusate sodium  100 mg Oral BID   enoxaparin (LOVENOX) injection  120 mg Subcutaneous Q12H   escitalopram  20 mg Oral Daily   ipratropium-albuterol  3 mL Nebulization TID   nicotine  14 mg Transdermal Daily   mouth rinse  15 mL Mouth Rinse 4 times per day   pantoprazole  40 mg Oral Daily   pneumococcal 20-valent conjugate vaccine  0.5 mL Intramuscular Tomorrow-1000   polyethylene glycol  17 g Oral Daily   pravastatin  10 mg Oral q1800   Ensure Max Protein  11 oz Oral BID   Continuous Infusions:  sodium chloride Stopped (02/27/23 1558)     Assessment & Plan:   Decompensated HFpEF HOCM New onset atrial fibrillation with RVR Patient back in A-fib with heart rates in the 110s to 120s, not on any rate control medications She was treated for decompensated heart failure earlier today with Lasix 20 with 700 cc urine output Echocardiogram from last week reviewed, she is noted to have "severe asymmetrical left ventricular hypertrophy of the septal segment" although her LV internal cavity size was normal.  This is a new finding compared to echo in 2013. Given HOCM, I am concerned about dropping her preload too much with to aggressive diuresis Heart failure would Improve with rate control although I am not entirely  comfortable with use of beta-blocker in setting of decompensated heart failure. Cardiology consult requested to assist with management of decompensated heart failure in the setting of new onset A-fib and apparently new diagnosis of HOCM.   Patient is on full dose Lovenox for secondary prevention of CVA. 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 Continue treatment with inhaled bronchodilators As needed and at bedtime BiPAP Outpatient OSA and pulmonary follow-up  HTN Continue to hold HCTZ/lisinopril for now Patient may benefit from initiation of beta-blocker if warranted  Hyperglycemia without previous diagnosis of DM Patient meets criteria for diabetes mellitus given single glucose of 204 Sugars are presently well-controlled on SSI  Hemoglobin A1c 6.2 on admission  Anxiety and depression Restart Escitalopram    DVT prophylaxis: Lovenox Code Status: DNR Family Communication: None today   Studies: DG Chest Port 1 View  Result Date: 03/02/2023 CLINICAL DATA:  Shortness of breath EXAM: PORTABLE CHEST 1 VIEW COMPARISON:  02/25/2023 FINDINGS: Cardiac shadow is enlarged but stable. Mild vascular congestion is seen. Previously seen tubes and lines have been removed. Small effusions are noted bilaterally. Mild right basilar atelectasis is seen. No bony abnormality is seen. IMPRESSION: Vascular congestion with small effusions and right basilar atelectasis. Electronically Signed   By: Kimberly Love M.D.   On: 03/02/2023 04:01    Principal Problem:   Acute respiratory acidosis (HCC) Active Problems:   Acute respiratory failure (HCC)   Acute respiratory failure with hypoxia and hypercapnia (HCC)   DNR (do not resuscitate)   Goals of care, counseling/discussion   Pressure injury of skin   Parainfluenza infection     Kimberly Love Kimberly Love, [facility]  If 7PM-7AM, please contact  night-coverage [email or web address]   LOS: 6 days

## 2023-03-02 NOTE — Progress Notes (Signed)
Physical Therapy Treatment Patient Details Name: Kimberly Love MRN: [id number] DOB: 1951/07/27 Today's Date: 03/02/2023   History of Present Illness 72 yo who presented to  ED 6/28 w SOB. Sounds like ongoing x 4d. Associated URI sx, cough, DOE. No fever chills. Hypoxic on EMS arrival w sats 70s. CTA chest with no PE, no pleural effusion, mild cardiomegaly, BLL bronchial wall thickening, scatred ASD RUL.PMH mood disorder HTN, GERD, tobacco use, diastolic HF (last echo 2013) s/p cataract surgery presented to [facility]    PT Comments  General Comments: AxO x 3 very pleasant Lady.  Lives home with Spouse and was Indep. "Last time I was in the hospital was 2013 for a knee replacement by Dr Sherlean Foot"   Pt unsure/unaware of her current medical condiiton.  Cardiologist came in during session and explained her A Fib and PNA and importance of ceasing Tobacco use. Assisted OOB required increased time and + 2 assist.  General bed mobility comments: assist for upper body and increased time to scoot to EOB due to weakness and body habitus.  Assisted back to bed supporting B LE.  + 2 to scoot to [facility].  General transfer comment: pt self able to stand using forward momentum.  Assisted on/off BSC.  Did require assist for peri care care as pt was unable.  General Gait Details: limited amb distance due to MAX c/o fatigue/weakness/dizziness.  HR increased from 109 at rest to 142 and pt required 8 lts to achieve sats > 90%.  Pt requesting back to bed.  "I had a bad a night last night". Pt plans to return home with Spouse when medically ready.     Assistance Recommended at Discharge Intermittent Supervision/Assistance  If plan is discharge home, recommend the following:  Can travel by private vehicle    A little help with walking and/or transfers;A little help with bathing/dressing/bathroom;Help with stairs or ramp for entrance;Assistance with cooking/housework;Assist for transportation      Equipment Recommendations   None recommended by PT    Recommendations for Other Services       Precautions / Restrictions Precautions Precautions: Fall Precaution Comments: monitor vitals Restrictions Weight Bearing Restrictions: No     Mobility  Bed Mobility               General bed mobility comments: assist for upper body and increased time to scoot to EOB due to weakness and body habitus.  Assisted back to bed supporting B LE.  + 2 to scoot to [facility].    Transfers Overall transfer level: Needs assistance Equipment used: Rolling walker (2 wheels) Transfers: Sit to/from Stand Sit to Stand: Supervision, Min guard           General transfer comment: pt self able to stand using forward momentum.  Assisted on/off BSC.  Did require assist for peri care care as pt was unable.    Ambulation/Gait Ambulation/Gait assistance: Min assist, +2 physical assistance, +2 safety/equipment Gait Distance (Feet): 2 Feet Assistive device: Rolling walker (2 wheels) Gait Pattern/deviations: Step-to pattern, Trunk flexed, Wide base of support Gait velocity: decreased     General Gait Details: limited amb distance due to MAX c/o fatigue/weakness/dizziness.  HR increased from 109 at rest to 142 and pt required 8 lts to achieve sats > 90%.  Pt requesting back to bed.  "I had a bad a night last night".   Stairs             Armed forces technical officer  Bed    Modified Rankin (Stroke Patients Only)       Balance                                            Cognition Arousal/Alertness: Awake/alert Behavior During Therapy: WFL for tasks assessed/performed Overall Cognitive Status: Within Functional Limits for tasks assessed                                 General Comments: AxO x 3 very pleasant Lady.  Lives home with Spouse and was Indep. "Last time I was in the hospital was 2013 for a knee replacement by Dr Sherlean Foot"   Pt unsure/unaware of her current medical condiiton.   Cardiologist came in during session and explained her A Fib and PNA and importance of ceasing Tobacco use.        Exercises      General Comments        Pertinent Vitals/Pain Pain Assessment Pain Assessment: No/denies pain    Home Living                          Prior Function            PT Goals (current goals can now be found in the care plan section) Progress towards PT goals: Progressing toward goals    Frequency    Min 1X/week      PT Plan Current plan remains appropriate    Co-evaluation              AM-PAC PT "6 Clicks" Mobility   Outcome Measure  Help needed turning from your back to your side while in a flat bed without using bedrails?: A Little Help needed moving from lying on your back to sitting on the side of a flat bed without using bedrails?: A Little Help needed moving to and from a bed to a chair (including a wheelchair)?: A Little Help needed standing up from a chair using your arms (e.g., wheelchair or bedside chair)?: A Little Help needed to walk in hospital room?: A Lot Help needed climbing 3-5 steps with a railing? : Total 6 Click Score: 15    End of Session Equipment Utilized During Treatment: Gait belt;Oxygen Activity Tolerance: Patient limited by fatigue Patient left: with call bell/phone within reach;with chair alarm set;in bed Nurse Communication: Mobility status PT Visit Diagnosis: Difficulty in walking, not elsewhere classified (R26.2)     Time: 1610-9604 PT Time Calculation (min) (ACUTE ONLY): 28 min  Charges:    $Gait Training: 8-22 mins $Therapeutic Activity: 8-22 mins PT General Charges $$ ACUTE PT VISIT: 1 Visit                     Felecia Shelling  PTA Acute  Rehabilitation Services Office M-F          [phone]

## 2023-03-02 NOTE — Consult Note (Addendum)
Cardiology Consultation   Patient ID: Kimberly Love MRN: [id number]; DOB: [birth year]  Admit date: 02/24/2023 Date of Consult: 03/02/2023  PCP:  Benita Stabile, MD   [facility] Providers Cardiologist:  None   {  Patient Profile:   Kimberly Love is a 72 y.o. female with a hx of obesity and COPD who is being seen 03/02/2023 for the evaluation of atrial fib with a RVR at the request of Dr. Luberta Robertson.  History of Present Illness:   Kimberly Love is a 72 yo woman who has not had any known cardiac problems who was admitted with pneumonia and VDRF. She had atrial fib initially and eventually reverted back to NSR, was extubated and transferred to tele. The patient has developed recurrent atrial fib and worsening sob/CHF and is referred for additional eval. She has been maintained on oxygen therapy. The patient has a long h/o tobacco abuse but stopped smoking "the day I came in."   Past Medical History:  Diagnosis Date   Anxiety    Arthritis    OA- L knee    Chronic kidney disease    Complication of anesthesia    had seizure in 1973  while waking up, after childbirth - vaginal  birth    Depression    GERD (gastroesophageal reflux disease)    Hiatal hernia    Hypertension    dr Timothy Lasso   [facility]  [place]   PONV (postoperative nausea and vomiting)    Seizures (HCC)    x1- 1973- post childbirth     Past Surgical History:  Procedure Laterality Date   ABDOMINAL HYSTERECTOMY     partial    BREAST SURGERY     bio  2012   neg   CATARACT EXTRACTION W/PHACO Left 01/31/2020   Procedure: CATARACT EXTRACTION PHACO AND INTRAOCULAR LENS PLACEMENT (IOC);  Surgeon: Fabio Pierce, MD;  Location: [facility];  Service: Ophthalmology;  Laterality: Left;  CDE: 25.85   CATARACT EXTRACTION W/PHACO Right 02/14/2020   Procedure: CATARACT EXTRACTION PHACO AND INTRAOCULAR LENS PLACEMENT RIGHT EYE;  Surgeon: Fabio Pierce, MD;  Location: [facility];  Service: Ophthalmology;  Laterality: Right;   CDE: 15.45   CHOLECYSTECTOMY     COLONOSCOPY N/A 05/31/2018   Procedure: COLONOSCOPY;  Surgeon: Malissa Hippo, MD;  Location: [facility];  Service: Endoscopy;  Laterality: N/A;  830   COLONOSCOPY N/A 05/09/2019   Procedure: COLONOSCOPY;  Surgeon: Malissa Hippo, MD;  Location: [facility];  Service: Endoscopy;  Laterality: N/A;  930   ESOPHAGOGASTRODUODENOSCOPY N/A 02/05/2016   Procedure: ESOPHAGOGASTRODUODENOSCOPY (EGD);  Surgeon: Malissa Hippo, MD;  Location: [facility];  Service: Endoscopy;  Laterality: N/A;  12:25 - moved to 10:40 - Ann notified pt   orthorscopy     rt knee   POLYPECTOMY  05/31/2018   Procedure: POLYPECTOMY;  Surgeon: Malissa Hippo, MD;  Location: [facility];  Service: Endoscopy;;  colon   POLYPECTOMY  05/09/2019   Procedure: POLYPECTOMY;  Surgeon: Malissa Hippo, MD;  Location: [facility];  Service: Endoscopy;;   TONSILLECTOMY     TOTAL KNEE ARTHROPLASTY  12/26/2011   Procedure: TOTAL KNEE ARTHROPLASTY;  Surgeon: Raymon Mutton, MD;  Location: [facility];  Service: Orthopedics;  Laterality: Right;  right total knee arthroplasty   TOTAL KNEE ARTHROPLASTY  07/02/2012   left knee   TOTAL KNEE ARTHROPLASTY  07/02/2012   Procedure: TOTAL KNEE ARTHROPLASTY;  Surgeon: Raymon Mutton, MD;  Location:  [facility];  Service: Orthopedics;  Laterality: Left;  left total knee replacement   TUBAL LIGATION       Home Medications:  Prior to Admission medications   Medication Sig Start Date End Date Taking? Authorizing Provider  escitalopram (LEXAPRO) 20 MG tablet Take 20 mg by mouth daily. 03/29/19  Yes [provider]  lisinopril-hydrochlorothiazide (PRINZIDE,ZESTORETIC) 20-25 MG per tablet Take 1 tablet by mouth daily.    Yes [provider]  pravastatin (PRAVACHOL) 10 MG tablet Take 10 mg by mouth daily.   Yes [provider]    Inpatient Medications: Scheduled Meds:  docusate sodium  100 mg Oral BID   enoxaparin (LOVENOX) injection  120  mg Subcutaneous Q12H   escitalopram  20 mg Oral Daily   ipratropium-albuterol  3 mL Nebulization TID   nicotine  14 mg Transdermal Daily   mouth rinse  15 mL Mouth Rinse 4 times per day   pantoprazole  40 mg Oral Daily   pneumococcal 20-valent conjugate vaccine  0.5 mL Intramuscular Tomorrow-1000   polyethylene glycol  17 g Oral Daily   pravastatin  10 mg Oral q1800   Ensure Max Protein  11 oz Oral BID   Continuous Infusions:  sodium chloride Stopped (02/27/23 1558)   PRN Meds: sodium chloride, guaiFENesin, ipratropium-albuterol, melatonin, mouth rinse  Allergies:    Allergies  Allergen Reactions   Oxycodone Anaphylaxis    Patient ended up in icu    Social History:   Social History   Socioeconomic History   Marital status: Married    Spouse name: Not on file   Number of children: Not on file   Years of education: Not on file   Highest education level: Not on file  Occupational History   Not on file  Tobacco Use   Smoking status: Every Day    Packs/day: 1.50    Years: 32.00    Additional pack years: 0.00    Total pack years: 48.00    Types: Cigarettes   Smokeless tobacco: Never  Vaping Use   Vaping Use: Never used  Substance and Sexual Activity   Alcohol use: No    Comment: "Rarely"   Drug use: No   Sexual activity: Not on file  Other Topics Concern   Not on file  Social History Narrative   Not on file   Social Determinants of Health   Financial Resource Strain: Not on file  Food Insecurity: No Food Insecurity (02/24/2023)   Hunger Vital Sign    Worried About Running Out of Food in the Last Year: Never true    Ran Out of Food in the Last Year: Never true  Transportation Needs: No Transportation Needs (02/24/2023)   PRAPARE - Administrator, Civil Service (Medical): No    Lack of Transportation (Non-Medical): No  Physical Activity: Not on file  Stress: Not on file  Social Connections: Not on file  Intimate Partner Violence: Not At Risk  (02/27/2023)   Humiliation, Afraid, Rape, and Kick questionnaire    Fear of Current or Ex-Partner: No    Emotionally Abused: No    Physically Abused: No    Sexually Abused: No    Family History:    Family History  Problem Relation Age of Onset   Colon cancer Neg Hx      ROS:  Please see the history of present illness.   All other ROS reviewed and negative.     Physical Exam/Data:   Vitals:  03/02/23 0809 03/02/23 0900 03/02/23 1056 03/02/23 1107  BP:    (!) 152/78  Pulse:    99  Resp:    18  Temp:    98.1 F (36.7 C)  TempSrc:    Oral  SpO2: 95% (!) 87% 92% 94%  Weight:      Height:        Intake/Output Summary (Last 24 hours) at 03/02/2023 1255 Last data filed at 03/02/2023 1018 Gross per 24 hour  Intake 1460.49 ml  Output 2600 ml  Net -1139.51 ml      03/02/2023    5:00 AM 03/01/2023    5:00 AM 02/28/2023    4:52 AM  Last 3 Weights  Weight (lbs) 267 lb 4.8 oz 267 lb 4.8 oz 260 lb 5.8 oz  Weight (kg) 121.247 kg 121.246 kg 118.1 kg     Body mass index is 42.5 kg/m.  General:  morbidly obese, Well nourished, well developed, in no acute distress HEENT: normal Neck: no JVD Vascular: No carotid bruits; Distal pulses 2+ bilaterally Cardiac:  normal S1, S2; IRIRR; no murmur  Lungs:  scattered wheezes and rhonchi but no increased work of breathing. Abd: soft, nontender, no hepatomegaly  Ext: no edema Musculoskeletal:  No deformities, BUE and BLE strength normal and equal Skin: warm and dry  Neuro:  CNs 2-12 intact, no focal abnormalities noted Psych:  Normal affect   EKG:  The EKG was personally reviewed and demonstrates:  atrial fib with a RVR and RBBB Telemetry:  Telemetry was personally reviewed and demonstrates:  atrial fib with a RVR  Relevant CV Studies: 2D echo reviewed. Preserved LV function  Laboratory Data:  High Sensitivity Troponin:   Recent Labs  Lab 02/24/23 1103 02/24/23 1143  TROPONINIHS 26* 27*     Chemistry Recent Labs  Lab  02/28/23 0449 03/01/23 0523 03/02/23 0436  NA 142 141 139  K 3.7 3.8 4.1  CL 95* 94* 95*  CO2 38* 36* 33*  GLUCOSE 116* 122* 123*  BUN 14 10 9   CREATININE 0.46 0.49 0.34*  CALCIUM 8.4* 8.6* 8.8*  MG 2.2 2.4 2.5*  GFRNONAA >60 >60 >60  ANIONGAP 9 11 11     Recent Labs  Lab 02/24/23 1103  PROT 8.3*  ALBUMIN 3.9  AST 144*  ALT 99*  ALKPHOS 61  BILITOT 1.6*   Lipids  Recent Labs  Lab 02/26/23 0405  TRIG 96    Hematology Recent Labs  Lab 02/28/23 0449 03/01/23 0523 03/02/23 0436  WBC 10.7* 11.0* 11.2*  RBC 4.23 4.54 4.57  HGB 12.2 13.3 13.2  HCT 41.0 44.6 45.2  MCV 96.9 98.2 98.9  MCH 28.8 29.3 28.9  MCHC 29.8* 29.8* 29.2*  RDW 16.5* 16.2* 16.0*  PLT 225 238 214   Thyroid No results for input(s): "TSH", "FREET4" in the last 168 hours.  BNP Recent Labs  Lab 02/24/23 1105 02/25/23 0325 03/02/23 0501  BNP 229.0* 231.4* 177.7*    DDimer No results for input(s): "DDIMER" in the last 168 hours.   Radiology/Studies:  DG Chest Port 1 View  Result Date: 03/02/2023 CLINICAL DATA:  Shortness of breath EXAM: PORTABLE CHEST 1 VIEW COMPARISON:  02/25/2023 FINDINGS: Cardiac shadow is enlarged but stable. Mild vascular congestion is seen. Previously seen tubes and lines have been removed. Small effusions are noted bilaterally. Mild right basilar atelectasis is seen. No bony abnormality is seen. IMPRESSION: Vascular congestion with small effusions and right basilar atelectasis. Electronically Signed   By: Alcide Clever  M.D.   On: 03/02/2023 04:01     Assessment and Plan:   New atrial fib with a RVR - I will give IV digoxin and start an IV cardizem drip. Long term, either a calcium blocker or cardiac selective beta blocker.  Start systemic anti-coag with eliquis unless contra-indicated. Acute systolic heart failure - continue IV lasix. Morbid obesity - she will need to lose weight as an outpatient.  Possible HCM - her septum is elevated at 17 mm. It was 11 years ago.  Unclear if the increased dimension in septum is important at this point. We will follow.      For questions or updates, please contact [facility] Please consult [email or web address] for contact info under    Signed, Lewayne Bunting, MD  03/02/2023 12:56 PM

## 2023-03-02 NOTE — Progress Notes (Signed)
Occupational Therapy Treatment Patient Details Name: Kimberly Love MRN: [id number] DOB: [birth year] Today's Date: 03/02/2023   History of present illness 72 yo who presented to  ED 6/28 w SOB. Sounds like ongoing x 4d. Associated URI sx, cough, DOE. No fever chills. Hypoxic on EMS arrival w sats 70s. CTA chest with no PE, no pleural effusion, mild cardiomegaly, BLL bronchial wall thickening, scatred ASD RUL.PMH mood disorder HTN, GERD, tobacco use, diastolic HF (last echo 2013) s/p cataract surgery presented to [facility]   OT comments  The pt reported feelings of shortness of breath and decreased overall activity tolerance, with subsequent apprehension about implementing progressive activity. As such, she gently deferred attempts at out of bed activity. She was however amendable to instruction at bed level. She was subsequently educated on positioning at bed level to help facilitate lung expansion, implementing deep breathing exercises as needed, implementing therapeutic rest breaks as needed during functional activity, importance of and benefits of implementing regular functional activity during her hospital stay, and simple therapeutic exercises at bed level. Continue OT plan of care. Patient will benefit from continued inpatient follow up therapy, <3 hours/day.    Recommendations for follow up therapy are one component of a multi-disciplinary discharge planning process, led by the attending physician.  Recommendations may be updated based on patient status, additional functional criteria and insurance authorization.    Assistance Recommended at Discharge Frequent or constant Supervision/Assistance  Patient can return home with the following  A lot of help with bathing/dressing/bathroom;A lot of help with walking and/or transfers;Direct supervision/assist for financial management;Help with stairs or ramp for entrance;Assist for transportation;Direct supervision/assist for medications  management;Assistance with cooking/housework   Equipment Recommendations  Other (comment) (defer to next level of care)    Recommendations for Other Services      Precautions / Restrictions Restrictions Weight Bearing Restrictions: No Other Position/Activity Restrictions: monitor O2 saturation and heart rate       Mobility Bed Mobility    General bed mobility comments: the pt deferred    Transfers      General transfer comment: the pt deferred         ADL either performed or assessed with clinical judgement   ADL Overall ADL's : Needs assistance/impaired Eating/Feeding: Modified independent;Sitting Eating/Feeding Details (indicate cue type and reason): based on clinical judgement Grooming: Minimal assistance;Bed level Grooming Details (indicate cue type and reason): based on clinical judgement         Upper Body Dressing : Minimal assistance   Lower Body Dressing: Maximal assistance;Sit to/from stand                        Cognition Arousal/Alertness: Awake/alert Behavior During Therapy: WFL for tasks assessed/performed Overall Cognitive Status: Within Functional Limits for tasks assessed                            Frequency  Min 1X/week        Progress Toward Goals  OT Goals(current goals can now be found in the care plan section)     Acute Rehab OT Goals Patient Stated Goal: improved breathing OT Goal Formulation: With patient Time For Goal Achievement: 03/14/23 Potential to Achieve Goals: Fair  Plan Discharge plan remains appropriate       AM-PAC OT "6 Clicks" Daily Activity     Outcome Measure   Help from another person eating meals?: None Help from another person taking care  of personal grooming?: A Little Help from another person toileting, which includes using toliet, bedpan, or urinal?: A Lot Help from another person bathing (including washing, rinsing, drying)?: A Lot Help from another person to put on and  taking off regular upper body clothing?: A Little Help from another person to put on and taking off regular lower body clothing?: A Lot 6 Click Score: 16    End of Session Equipment Utilized During Treatment: Oxygen  OT Visit Diagnosis: Muscle weakness (generalized) (M62.81);Unsteadiness on feet (R26.81)   Activity Tolerance Other (comment) (Limited by compromised endurance and feelings of shortness of breath)   Patient Left in bed;with call bell/phone within reach   Nurse Communication Mobility status        Time: 1610-9604 OT Time Calculation (min): 10 min  Charges: OT General Charges $OT Visit: 1 Visit OT Treatments $Therapeutic Activity: 8-22 mins     Reuben Likes, OTR/L 03/02/2023, 4:55 PM

## 2023-03-02 NOTE — Plan of Care (Signed)
  Problem: Education: Goal: Knowledge of General Education information will improve Description: Including pain rating scale, medication(s)/side effects and non-pharmacologic comfort measures Outcome: Progressing   Problem: Clinical Measurements: Goal: Will remain free from infection Outcome: Progressing Goal: Respiratory complications will improve Outcome: Progressing   

## 2023-03-02 NOTE — Progress Notes (Addendum)
   03/02/23 1947  BiPAP/CPAP/SIPAP  Reason BIPAP/CPAP not in use Other(comment)   Pt adamantly refused to wear bipap tonight.  Pt stated it's too uncomfortable and that it "drove her nuts" the last couple of nights.  This writer expressed the importance of wearing it and its benefits, but pt still refused.  Bipap remains in room.  RN aware.

## 2023-03-03 ENCOUNTER — Other Ambulatory Visit (HOSPITAL_COMMUNITY): Payer: Self-pay

## 2023-03-03 DIAGNOSIS — I480 Paroxysmal atrial fibrillation: Secondary | ICD-10-CM

## 2023-03-03 LAB — TSH: TSH: 4.876 u[IU]/mL — ABNORMAL HIGH (ref 0.350–4.500)

## 2023-03-03 LAB — CBC
HCT: 42.6 % (ref 36.0–46.0)
Hemoglobin: 12.9 g/dL (ref 12.0–15.0)
MCH: 29.1 pg (ref 26.0–34.0)
MCHC: 30.3 g/dL (ref 30.0–36.0)
MCV: 95.9 fL (ref 80.0–100.0)
Platelets: 259 10*3/uL (ref 150–400)
RBC: 4.44 MIL/uL (ref 3.87–5.11)
RDW: 15.7 % — ABNORMAL HIGH (ref 11.5–15.5)
WBC: 10.7 10*3/uL — ABNORMAL HIGH (ref 4.0–10.5)
nRBC: 0 % (ref 0.0–0.2)

## 2023-03-03 MED ORDER — DILTIAZEM HCL 60 MG PO TABS
60.0000 mg | ORAL_TABLET | Freq: Three times a day (TID) | ORAL | Status: DC
  Administered 2023-03-03 – 2023-03-04 (×3): 60 mg via ORAL
  Filled 2023-03-03 (×3): qty 1

## 2023-03-03 MED ORDER — FUROSEMIDE 10 MG/ML IJ SOLN
20.0000 mg | Freq: Once | INTRAMUSCULAR | Status: AC
  Administered 2023-03-03: 20 mg via INTRAVENOUS
  Filled 2023-03-03: qty 2

## 2023-03-03 NOTE — Progress Notes (Signed)
   03/03/23 2301  BiPAP/CPAP/SIPAP  Reason BIPAP/CPAP not in use Non-compliant   PT sternly refused nocturnal bipap.

## 2023-03-03 NOTE — Progress Notes (Signed)
    Patient Name: Kimberly Love           DOB: 1951/05/31  MRN: [id number]      Admission Date: 02/24/2023  Attending Provider: Leandro Reasoner Tubl*  Primary Diagnosis: Acute respiratory acidosis (HCC)   Level of care: Progressive    CROSS COVER NOTE   Date of Service   03/03/2023   [name], 72 y.o. female, was admitted on 02/24/2023 for Acute respiratory acidosis (HCC).    HPI/Events of Note   Dyspnea at rest, tachypneic, scattered rhonchi, optimal O2 saturation with supplemental oxygen. Similar event last night, improved after Lasix use Adding low-dose IV Lasix   Interventions/ Plan   Above        Anthoney Harada, DNP, [facility] [place]

## 2023-03-03 NOTE — Progress Notes (Deleted)
Rounding Note    Patient Name: Kimberly Love Date of Encounter: 03/03/2023  [facility] Cardiologist: None   Subjective   Short of breath with minimal activity.  On 5 L via nasal cannula.  No chest pain or palpitations.  Inpatient Medications    Scheduled Meds:  docusate sodium  100 mg Oral BID   enoxaparin (LOVENOX) injection  120 mg Subcutaneous Q12H   escitalopram  20 mg Oral Daily   furosemide  40 mg Intravenous Daily   ipratropium-albuterol  3 mL Nebulization TID   nicotine  14 mg Transdermal Daily   mouth rinse  15 mL Mouth Rinse 4 times per day   pantoprazole  40 mg Oral Daily   pneumococcal 20-valent conjugate vaccine  0.5 mL Intramuscular Tomorrow-1000   polyethylene glycol  17 g Oral Daily   pravastatin  10 mg Oral q1800   Ensure Max Protein  11 oz Oral BID   Continuous Infusions:  sodium chloride Stopped (02/27/23 1558)   diltiazem (CARDIZEM) infusion 5 mg/hr (03/03/23 0705)   PRN Meds: sodium chloride, guaiFENesin, ipratropium-albuterol, melatonin, mouth rinse   Vital Signs    Vitals:   03/03/23 0459 03/03/23 0500 03/03/23 0600 03/03/23 0740  BP:   132/68   Pulse:   71   Resp:   (!) 22   Temp: 97.7 F (36.5 C)     TempSrc: Oral     SpO2:   94% 93%  Weight:  122.2 kg    Height:        Intake/Output Summary (Last 24 hours) at 03/03/2023 0839 Last data filed at 03/03/2023 0705 Gross per 24 hour  Intake 565.32 ml  Output 2675 ml  Net -2109.68 ml      03/03/2023    5:00 AM 03/02/2023    5:00 AM 03/01/2023    5:00 AM  Last 3 Weights  Weight (lbs) 269 lb 6.4 oz 267 lb 4.8 oz 267 lb 4.8 oz  Weight (kg) 122.2 kg 121.247 kg 121.246 kg      Telemetry    Atrial fibrillation, heart rate 80-100- Personally Reviewed  ECG    No new tracing- Personally Reviewed  Physical Exam   GEN: No acute distress.   Neck: Difficult to assess  cardiac: Irregularly irregular Respiratory: Diffuse rhonchi throughout  GI: Soft, nontender,  non-distended  MS: No edema; No deformity. Neuro:  Nonfocal  Psych: Normal affect   Labs    High Sensitivity Troponin:   Recent Labs  Lab 02/24/23 1103 02/24/23 1143  TROPONINIHS 26* 27*     Chemistry Recent Labs  Lab 02/24/23 1103 02/25/23 0544 02/28/23 0449 03/01/23 0523 03/02/23 0436  NA 132*   < > 142 141 139  K 4.5   < > 3.7 3.8 4.1  CL 93*   < > 95* 94* 95*  CO2 26   < > 38* 36* 33*  GLUCOSE 167*   < > 116* 122* 123*  BUN 22   < > 14 10 9   CREATININE 0.81   < > 0.46 0.49 0.34*  CALCIUM 8.5*   < > 8.4* 8.6* 8.8*  MG  --    < > 2.2 2.4 2.5*  PROT 8.3*  --   --   --   --   ALBUMIN 3.9  --   --   --   --   AST 144*  --   --   --   --   ALT 99*  --   --   --   --  ALKPHOS 61  --   --   --   --   BILITOT 1.6*  --   --   --   --   GFRNONAA >60   < > >60 >60 >60  ANIONGAP 13   < > 9 11 11    < > = values in this interval not displayed.    Lipids  Recent Labs  Lab 02/26/23 0405  TRIG 96    Hematology Recent Labs  Lab 03/01/23 0523 03/02/23 0436 03/03/23 0452  WBC 11.0* 11.2* 10.7*  RBC 4.54 4.57 4.44  HGB 13.3 13.2 12.9  HCT 44.6 45.2 42.6  MCV 98.2 98.9 95.9  MCH 29.3 28.9 29.1  MCHC 29.8* 29.2* 30.3  RDW 16.2* 16.0* 15.7*  PLT 238 214 259   Thyroid No results for input(s): "TSH", "FREET4" in the last 168 hours.  BNP Recent Labs  Lab 02/24/23 1105 02/25/23 0325 03/02/23 0501  BNP 229.0* 231.4* 177.7*    DDimer No results for input(s): "DDIMER" in the last 168 hours.   Radiology    DG Chest Port 1 View  Result Date: 03/02/2023 CLINICAL DATA:  Shortness of breath EXAM: PORTABLE CHEST 1 VIEW COMPARISON:  02/25/2023 FINDINGS: Cardiac shadow is enlarged but stable. Mild vascular congestion is seen. Previously seen tubes and lines have been removed. Small effusions are noted bilaterally. Mild right basilar atelectasis is seen. No bony abnormality is seen. IMPRESSION: Vascular congestion with small effusions and right basilar atelectasis.  Electronically Signed   By: Alcide Clever M.D.   On: 03/02/2023 04:01    Cardiac Studies   Echocardiogram 02/25/2023  1. Poor quality echo images despite Definity administration. Challenging  study in the setting of tachycardia. Left ventricular ejection fraction is  grossly normal. There is severe asymmetric left ventricular hypertrophy of  the septal segment.  Indeterminate diastolic filling due to E-A fusion. Consider Limited Echo  with contrast when tachycardia is resolved.   2. Right ventricular systolic function was not well visualized. The right  ventricular size is normal. Tricuspid regurgitation signal is inadequate  for assessing PA pressure.   3. The mitral valve was not well visualized. No evidence of mitral valve  regurgitation. No evidence of mitral stenosis.   4. The aortic valve was not well visualized. Aortic valve regurgitation  is not visualized. No aortic stenosis is present.   5. The inferior vena cava is dilated in size with <50% respiratory  variability, suggesting right atrial pressure of 15 mmHg.   Comparison(s): No prior Echocardiogram.   Patient Profile     72 y.o. female hypertension, obesity, COPD, who is being evaluated for A-fib RVR.  Admitted 02/24/2023 currently being treated for pneumonia which required intubation and was in the ICU now transferred back to the floor.  She presented with A-fib and then converted back to normal sinus rhythm and then subsequently developed A-fib once again with worsening complaints of shortness of breath.  Respiratory status appears to be declining since transfer from ICU however has been getting a lot of fluids.  Currently on 5 L via nasal cannula.  Assessment & Plan    New onset paroxysmal atrial fibrillation  She was given IV digoxin and started on IV diltiazem drip.  Heart rate is controlled between 80-100.  No overt peripheral edema however has significant rhonchi throughout pulmonary exam. Continue IV diltiazem and  Lovenox for anticoagulation. Plan for eventual transition to Eliquis.  Will follow-up outpatient after 3 weeks of anticoagulation and plan for cardioversion  if patient still in A-fib.  Chronic HFpEF New HCM Echocardiogram during this admission overall normal LVEF but showed severe asymptomatic LVH of the septal segment, 17 mm.   These are new findings compared to an echo in 2013. Not a great candidate for SGLT2 inhibitor given morbid obesity Currently diuresing with IV Lasix 40 mg with good urinary output.  2.6 L in the last 24 hours. Echo study was not ideal.  Consider repeating outpatient. Blood pressure decently controlled currently.  Add back on PTA lisinopril once more stable and indicated.  Acute on chronic respiratory failure Parainfluenza virus pneumonia Per primary team For questions or updates, please contact  [facility] Please consult [email or web address] for contact info under        Signed, Abagail Kitchens, PA-C  03/03/2023, 8:39 AM

## 2023-03-03 NOTE — TOC Benefit Eligibility Note (Signed)
Pharmacy Patient Advocate Encounter  Insurance verification completed.    The patient is insured through [employer] test claim for Eliquis and the current 30 day co-pay is $40.00.   This test claim was processed through [facility]- copay amounts may vary at other pharmacies due to pharmacy/plan contracts, or as the patient moves through the different stages of their insurance plan.

## 2023-03-03 NOTE — Progress Notes (Signed)
Rounding Note    Patient Name: Kimberly Love Date of Encounter: 03/03/2023  [facility] Cardiologist: Dr Ladona Ridgel  Subjective   No CP; complains of dyspnea  Inpatient Medications    Scheduled Meds:  docusate sodium  100 mg Oral BID   enoxaparin (LOVENOX) injection  120 mg Subcutaneous Q12H   escitalopram  20 mg Oral Daily   furosemide  40 mg Intravenous Daily   ipratropium-albuterol  3 mL Nebulization TID   nicotine  14 mg Transdermal Daily   mouth rinse  15 mL Mouth Rinse 4 times per day   pantoprazole  40 mg Oral Daily   pneumococcal 20-valent conjugate vaccine  0.5 mL Intramuscular Tomorrow-1000   polyethylene glycol  17 g Oral Daily   pravastatin  10 mg Oral q1800   Ensure Max Protein  11 oz Oral BID   Continuous Infusions:  sodium chloride Stopped (02/27/23 1558)   diltiazem (CARDIZEM) infusion 5 mg/hr (03/03/23 0705)   PRN Meds: sodium chloride, guaiFENesin, ipratropium-albuterol, melatonin, mouth rinse   Vital Signs    Vitals:   03/03/23 0459 03/03/23 0500 03/03/23 0600 03/03/23 0740  BP:   132/68   Pulse:   71   Resp:   (!) 22   Temp: 97.7 F (36.5 C)     TempSrc: Oral     SpO2:   94% 93%  Weight:  122.2 kg    Height:        Intake/Output Summary (Last 24 hours) at 03/03/2023 0841 Last data filed at 03/03/2023 0705 Gross per 24 hour  Intake 565.32 ml  Output 2675 ml  Net -2109.68 ml      03/03/2023    5:00 AM 03/02/2023    5:00 AM 03/01/2023    5:00 AM  Last 3 Weights  Weight (lbs) 269 lb 6.4 oz 267 lb 4.8 oz 267 lb 4.8 oz  Weight (kg) 122.2 kg 121.247 kg 121.246 kg      Telemetry    Atrial fibrillation rate controlled - Personally Reviewed   Physical Exam   GEN: No acute distress.   Neck: No JVD Cardiac: irregular Respiratory: Diminished BS GI: Soft, nontender, non-distended  MS: No edema Neuro:  Nonfocal  Psych: Normal affect   Labs    High Sensitivity Troponin:   Recent Labs  Lab 02/24/23 1103 02/24/23 1143   TROPONINIHS 26* 27*     Chemistry Recent Labs  Lab 02/24/23 1103 02/25/23 0544 02/28/23 0449 03/01/23 0523 03/02/23 0436  NA 132*   < > 142 141 139  K 4.5   < > 3.7 3.8 4.1  CL 93*   < > 95* 94* 95*  CO2 26   < > 38* 36* 33*  GLUCOSE 167*   < > 116* 122* 123*  BUN 22   < > 14 10 9   CREATININE 0.81   < > 0.46 0.49 0.34*  CALCIUM 8.5*   < > 8.4* 8.6* 8.8*  MG  --    < > 2.2 2.4 2.5*  PROT 8.3*  --   --   --   --   ALBUMIN 3.9  --   --   --   --   AST 144*  --   --   --   --   ALT 99*  --   --   --   --   ALKPHOS 61  --   --   --   --   BILITOT 1.6*  --   --   --   --  GFRNONAA >60   < > >60 >60 >60  ANIONGAP 13   < > 9 11 11    < > = values in this interval not displayed.    Lipids  Recent Labs  Lab 02/26/23 0405  TRIG 96    Hematology Recent Labs  Lab 03/01/23 0523 03/02/23 0436 03/03/23 0452  WBC 11.0* 11.2* 10.7*  RBC 4.54 4.57 4.44  HGB 13.3 13.2 12.9  HCT 44.6 45.2 42.6  MCV 98.2 98.9 95.9  MCH 29.3 28.9 29.1  MCHC 29.8* 29.2* 30.3  RDW 16.2* 16.0* 15.7*  PLT 238 214 259   BNP Recent Labs  Lab 02/24/23 1105 02/25/23 0325 03/02/23 0501  BNP 229.0* 231.4* 177.7*    Radiology    DG Chest Port 1 View  Result Date: 03/02/2023 CLINICAL DATA:  Shortness of breath EXAM: PORTABLE CHEST 1 VIEW COMPARISON:  02/25/2023 FINDINGS: Cardiac shadow is enlarged but stable. Mild vascular congestion is seen. Previously seen tubes and lines have been removed. Small effusions are noted bilaterally. Mild right basilar atelectasis is seen. No bony abnormality is seen. IMPRESSION: Vascular congestion with small effusions and right basilar atelectasis. Electronically Signed   By: Alcide Clever M.D.   On: 03/02/2023 04:01    Patient Profile     72 y.o. female with past medical history of COPD admitted with pneumonia/ventilator dependent respiratory failure.  Patient developed atrial fibrillation and cardiology asked to evaluate.  Echocardiogram technically difficult but  showed normal LV function and interpreted as severe asymmetric left ventricular hypertrophy.  I reviewed this and did not feel that was accurate.  Assessment & Plan    1 paroxysmal atrial fibrillation-will check TSH for completeness.  Change cadizem to 60 mg po every 8 hours. Continue enoxaparin and transition to apixaban prior to discharge.  Best option would likely be rate control and anticoagulation as she has baseline severe COPD.  However we will allow her to recover from recent pneumonia and follow-up in the office in 4 to 6 weeks.  If atrial fibrillation persists could plan on cardioversion at that time.  2 acute diastolic congestive heart failure-continue Lasix at present dose.  3 left ventricular hypertrophy-interpreted as severe asymmetric septal hypertrophy but I did personally review the patient's echocardiogram and feel that this is an accurate.  4 COPD/pneumonia-Per primary care.  For questions or updates, please contact [facility] Please consult [email or web address] for contact info under        Signed, Olga Millers, MD  03/03/2023, 8:41 AM

## 2023-03-03 NOTE — Progress Notes (Signed)
PT Cancellation Note  Patient Details Name: Kimberly Love MRN: [id number] DOB: [birth year]   Cancelled Treatment:    Reason Eval/Treat Not Completed: Other (comment) Pt reports "I just don't have it in me today, for therapy." Tried to encourage activity for strengthening as goal is to return home.  Reports ate sitting EOB and did some exercises earlier.  Will f/u later date.  Anise Salvo, PT Acute Rehab [facility] [phone]   Rayetta Humphrey 03/03/2023, 4:38 PM

## 2023-03-03 NOTE — Progress Notes (Signed)
PROGRESS NOTE    Kimberly Love  ZOX:[id number]  DOB: 09/29/1950  DOA: 02/24/2023 PCP: Kimberly Stabile, MD Outpatient Specialists:   Hospital course: 72 yo PMH mood disorder HTN,  GERD, tobacco use, diastolic HF (last echo 2013) s/p cataract surgery was admitted with acute on chronic hypoxic respiratory failure requiring intubation.  Patient was treated in ICU and workup ultimately revealed parainfluenza virus pneumonia.  She was ruled out for pulmonary emboli.  Patient was extubated on 7/1 and her CODE STATUS is confirmed to be DNR.  Patient was transferred out of ICU with improvement in respiratory function.   Subjective:  Patient states she is very sleepy.  Notes she got short of breath again last night.  Does feel better after the Lasix again.  Chest pain.  Objective: Vitals:   03/03/23 0600 03/03/23 0740 03/03/23 1200 03/03/23 1404  BP: 132/68  129/64   Pulse: 71     Resp: (!) 22  20   Temp:   97.6 F (36.4 C)   TempSrc:   Oral   SpO2: 94% 93%  92%  Weight:      Height:        Intake/Output Summary (Last 24 hours) at 03/03/2023 1850 Last data filed at 03/03/2023 1526 Gross per 24 hour  Intake 373.43 ml  Output 1700 ml  Net -1326.57 ml    Filed Weights   03/01/23 0500 03/02/23 0500 03/03/23 0500  Weight: 121.2 kg 121.2 kg 122.2 kg     Exam:  General: Sleepy patient lying in bed Eyes: sclera anicteric, conjuctiva mild injection bilaterally CVS: S1-S2, irregular  Respiratory: Lungs with transmitted upper respiratory sounds, bronchial in nature but they do sound wet, no clear rales or wheezes. GI: NABS, soft, NT  LE: Warm and well-perfused Neuro: A/O x 3,  grossly nonfocal.  Psych: patient is logical and coherent, judgement and insight appear normal, mood and affect appropriate to situation.  Data Reviewed:  Basic Metabolic Panel: Recent Labs  Lab 02/26/23 0405 02/27/23 0533 02/28/23 0449 03/01/23 0523 03/02/23 0436  NA 139 138 142 141 139  K 3.5  2.6* 3.7 3.8 4.1  CL 96* 92* 95* 94* 95*  CO2 33* 37* 38* 36* 33*  GLUCOSE 140* 204* 116* 122* 123*  BUN 29* 20 14 10 9   CREATININE 0.70 0.51 0.46 0.49 0.34*  CALCIUM 8.5* 8.4* 8.4* 8.6* 8.8*  MG 2.1 2.2 2.2 2.4 2.5*     CBC: Recent Labs  Lab 02/27/23 0533 02/28/23 0449 03/01/23 0523 03/02/23 0436 03/03/23 0452  WBC 12.6* 10.7* 11.0* 11.2* 10.7*  HGB 12.3 12.2 13.3 13.2 12.9  HCT 39.6 41.0 44.6 45.2 42.6  MCV 93.0 96.9 98.2 98.9 95.9  PLT 254 225 238 214 259      Scheduled Meds:  diltiazem  60 mg Oral Q8H   docusate sodium  100 mg Oral BID   enoxaparin (LOVENOX) injection  120 mg Subcutaneous Q12H   escitalopram  20 mg Oral Daily   furosemide  40 mg Intravenous Daily   ipratropium-albuterol  3 mL Nebulization TID   nicotine  14 mg Transdermal Daily   mouth rinse  15 mL Mouth Rinse 4 times per day   pantoprazole  40 mg Oral Daily   pneumococcal 20-valent conjugate vaccine  0.5 mL Intramuscular Tomorrow-1000   polyethylene glycol  17 g Oral Daily   pravastatin  10 mg Oral q1800   Ensure Max Protein  11 oz Oral BID   Continuous Infusions:  sodium chloride Stopped (02/27/23 1558)     Assessment & Plan:   Decompensated HFpEF HOCM New onset atrial fibrillation with RVR Very much appreciate Dr. Jens Love in cardiology consultation Patient with HFpEF and elevated heart rates are causing decompensation heart failure Continue Lasix 40 mg IV, initiated today by Dr. Jens Love Diltiazem drip changed to oral Cardizem Patient presently on full dose Lovenox for secondary stroke prevention, will need to be put on Eliquis at discharge . 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 Continue treatment with inhaled bronchodilators Supportive treatment for parainfluenza As needed and at bedtime BiPAP Outpatient OSA and pulmonary follow-up  HTN Blood pressure now with good control on Lasix  and Cardizem HCTZ/lisinopril is on hold  Hyperglycemia without previous diagnosis of DM Patient meets criteria for diabetes mellitus given single glucose of 204 Sugars are presently well-controlled on SSI  Hemoglobin A1c 6.2 on admission  Anxiety and depression Restart Escitalopram    DVT prophylaxis: Lovenox Code Status: DNR Family Communication: None today   Studies: DG Chest Port 1 View  Result Date: 03/02/2023 CLINICAL DATA:  Shortness of breath EXAM: PORTABLE CHEST 1 VIEW COMPARISON:  02/25/2023 FINDINGS: Cardiac shadow is enlarged but stable. Mild vascular congestion is seen. Previously seen tubes and lines have been removed. Small effusions are noted bilaterally. Mild right basilar atelectasis is seen. No bony abnormality is seen. IMPRESSION: Vascular congestion with small effusions and right basilar atelectasis. Electronically Signed   By: Kimberly Love M.D.   On: 03/02/2023 04:01    Principal Problem:   Acute respiratory acidosis (HCC) Active Problems:   Acute respiratory failure (HCC)   Acute respiratory failure with hypoxia and hypercapnia (HCC)   DNR (do not resuscitate)   Goals of care, counseling/discussion   Pressure injury of skin   Parainfluenza infection     Kimberly Love, [facility]  If 7PM-7AM, please contact night-coverage [email or web address]   LOS: 7 days

## 2023-03-04 DIAGNOSIS — I4892 Unspecified atrial flutter: Secondary | ICD-10-CM | POA: Diagnosis not present

## 2023-03-04 DIAGNOSIS — J9602 Acute respiratory failure with hypercapnia: Secondary | ICD-10-CM | POA: Diagnosis not present

## 2023-03-04 LAB — BASIC METABOLIC PANEL
Anion gap: 10 (ref 5–15)
BUN: 12 mg/dL (ref 8–23)
CO2: 39 mmol/L — ABNORMAL HIGH (ref 22–32)
Calcium: 8.7 mg/dL — ABNORMAL LOW (ref 8.9–10.3)
Chloride: 89 mmol/L — ABNORMAL LOW (ref 98–111)
Creatinine, Ser: 0.49 mg/dL (ref 0.44–1.00)
GFR, Estimated: >60 mL/min (ref >60)
Glucose, Bld: 146 mg/dL — ABNORMAL HIGH (ref 70–99)
Potassium: 3.3 mmol/L — ABNORMAL LOW (ref 3.5–5.1)
Sodium: 138 mmol/L (ref 135–145)

## 2023-03-04 MED ORDER — POTASSIUM CHLORIDE CRYS ER 20 MEQ PO TBCR
40.0000 meq | EXTENDED_RELEASE_TABLET | Freq: Once | ORAL | Status: AC
  Administered 2023-03-04: 40 meq via ORAL
  Filled 2023-03-04: qty 2

## 2023-03-04 MED ORDER — APIXABAN 5 MG PO TABS
5.0000 mg | ORAL_TABLET | Freq: Two times a day (BID) | ORAL | Status: DC
  Administered 2023-03-04 – 2023-03-11 (×15): 5 mg via ORAL
  Filled 2023-03-04 (×15): qty 1

## 2023-03-04 MED ORDER — DILTIAZEM HCL ER COATED BEADS 180 MG PO CP24
180.0000 mg | ORAL_CAPSULE | Freq: Every day | ORAL | Status: DC
  Administered 2023-03-04: 180 mg via ORAL
  Filled 2023-03-04: qty 1

## 2023-03-04 NOTE — Discharge Instructions (Addendum)

## 2023-03-04 NOTE — Progress Notes (Signed)
Rounding Note    Patient Name: Kimberly Love Date of Encounter: 03/04/2023  [facility] Cardiologist: Dr Ladona Ridgel  Subjective   Denies CP; mild dyspnea  Inpatient Medications    Scheduled Meds:  diltiazem  60 mg Oral Q8H   docusate sodium  100 mg Oral BID   enoxaparin (LOVENOX) injection  120 mg Subcutaneous Q12H   escitalopram  20 mg Oral Daily   furosemide  40 mg Intravenous Daily   ipratropium-albuterol  3 mL Nebulization TID   nicotine  14 mg Transdermal Daily   mouth rinse  15 mL Mouth Rinse 4 times per day   pantoprazole  40 mg Oral Daily   pneumococcal 20-valent conjugate vaccine  0.5 mL Intramuscular Tomorrow-1000   polyethylene glycol  17 g Oral Daily   pravastatin  10 mg Oral q1800   Ensure Max Protein  11 oz Oral BID   Continuous Infusions:  sodium chloride Stopped (02/27/23 1558)   PRN Meds: sodium chloride, guaiFENesin, ipratropium-albuterol, melatonin, mouth rinse   Vital Signs    Vitals:   03/03/23 2000 03/03/23 2100 03/04/23 0500 03/04/23 0520  BP: (!) 141/79  (!) 140/64   Pulse: 87  68   Resp:  (!) 22 (!) 25   Temp:  98 F (36.7 C)  97.7 F (36.5 C)  TempSrc:    Oral  SpO2: 94%  94%   Weight:      Height:        Intake/Output Summary (Last 24 hours) at 03/04/2023 0630 Last data filed at 03/04/2023 0520 Gross per 24 hour  Intake 294.35 ml  Output 1550 ml  Net -1255.65 ml       03/03/2023    5:00 AM 03/02/2023    5:00 AM 03/01/2023    5:00 AM  Last 3 Weights  Weight (lbs) 269 lb 6.4 oz 267 lb 4.8 oz 267 lb 4.8 oz  Weight (kg) 122.2 kg 121.247 kg 121.246 kg      Telemetry    Sinus with pacs and occasional junctional escape beat - Personally Reviewed   Physical Exam   GEN: NAD, WD Neck: supple, no JVD Cardiac: RRR Respiratory: Diminished BS; no rhonchi GI: Soft, NT/ND MS: No edema Neuro:  no focal findings Psych: Normal affect   Labs    High Sensitivity Troponin:   Recent Labs  Lab 02/24/23 1103  02/24/23 1143  TROPONINIHS 26* 27*      Chemistry Recent Labs  Lab 02/28/23 0449 03/01/23 0523 03/02/23 0436 03/04/23 0551  NA 142 141 139 138  K 3.7 3.8 4.1 3.3*  CL 95* 94* 95* 89*  CO2 38* 36* 33* 39*  GLUCOSE 116* 122* 123* 146*  BUN 14 10 9 12   CREATININE 0.46 0.49 0.34* 0.49  CALCIUM 8.4* 8.6* 8.8* 8.7*  MG 2.2 2.4 2.5*  --   GFRNONAA >60 >60 >60 >60  ANIONGAP 9 11 11 10      Lipids  Recent Labs  Lab 02/26/23 0405  TRIG 96     Hematology Recent Labs  Lab 03/01/23 0523 03/02/23 0436 03/03/23 0452  WBC 11.0* 11.2* 10.7*  RBC 4.54 4.57 4.44  HGB 13.3 13.2 12.9  HCT 44.6 45.2 42.6  MCV 98.2 98.9 95.9  MCH 29.3 28.9 29.1  MCHC 29.8* 29.2* 30.3  RDW 16.2* 16.0* 15.7*  PLT 238 214 259    BNP Recent Labs  Lab 03/02/23 0501  BNP 177.7*     Patient Profile     72  y.o. female with past medical history of COPD admitted with pneumonia/ventilator dependent respiratory failure.  Patient developed atrial fibrillation and cardiology asked to evaluate.  Echocardiogram technically difficult but showed normal LV function and interpreted as severe asymmetric left ventricular hypertrophy.  I reviewed this and did not feel that was accurate.  Assessment & Plan    1 paroxysmal atrial fibrillation/flutter-patient has converted to sinus rhythm.  Heart rate mildly decreased at times.  Will decrease Cardizem CD to 120 mg daily.  Continue apixaban.   2 acute diastolic congestive heart failure-patient remains euvolemic.  Change Lasix to 40 mg by mouth daily and follow.  3 left ventricular hypertrophy-interpreted as severe asymmetric septal hypertrophy but I did personally review the patient's echocardiogram and feel that this is an accurate.  4 COPD/pneumonia-Per primary care.  For questions or updates, please contact [facility] Please consult [email or web address] for contact info under        Signed, Kimberly Millers, MD  03/04/2023, 6:30 AM

## 2023-03-04 NOTE — Progress Notes (Signed)
   03/04/23 2333  BiPAP/CPAP/SIPAP  Reason BIPAP/CPAP not in use Non-compliant

## 2023-03-04 NOTE — Progress Notes (Signed)
PROGRESS NOTE    Kimberly Love  YNW:[id number]  DOB: [birth year]  DOA: 02/24/2023 PCP: Kimberly Stabile, MD Outpatient Specialists:   Hospital course: 72 yo PMH mood disorder HTN,  GERD, tobacco use, diastolic HF (last echo 2013) s/p cataract surgery was admitted with acute on chronic hypoxic respiratory failure requiring intubation.  Patient was treated in ICU and workup ultimately revealed parainfluenza virus pneumonia.  She was ruled out for pulmonary emboli.  Patient was extubated on 7/1 and her CODE STATUS is confirmed to be DNR.  Patient was transferred out of ICU with improvement in respiratory function.  Subjective:  SOB is improved. She reports no LE edema, does report orthopnea. Has not walked around. Not on oxygen at home. No chest pain.   Objective: Vitals:   03/03/23 2100 03/04/23 0500 03/04/23 0520 03/04/23 0731  BP:  (!) 140/64    Pulse:  68    Resp: (!) 22 (!) 25    Temp: 98 F (36.7 C)  97.7 F (36.5 C)   TempSrc:   Oral   SpO2:  94%  94%  Weight:      Height:        Intake/Output Summary (Last 24 hours) at 03/04/2023 1330 Last data filed at 03/04/2023 1159 Gross per 24 hour  Intake 133.23 ml  Output 2550 ml  Net -2416.77 ml   Filed Weights   03/01/23 0500 03/02/23 0500 03/03/23 0500  Weight: 121.2 kg 121.2 kg 122.2 kg     Exam:  General: Sleepy patient lying in bed Eyes: sclera anicteric, conjuctiva mild injection bilaterally CVS: S1-S2, irregular  Respiratory: Lungs with transmitted upper respiratory sounds, bronchial in nature but they do sound wet, no clear rales or wheezes. GI: NABS, soft, NT  LE: Warm and well-perfused Neuro: A/O x 3,  grossly nonfocal.  Psych: patient is logical and coherent, judgement and insight appear normal, mood and affect appropriate to situation.  Data Reviewed:  Basic Metabolic Panel: Recent Labs  Lab 02/26/23 0405 02/27/23 0533 02/28/23 0449 03/01/23 0523 03/02/23 0436 03/04/23 0551  NA 139 138 142  141 139 138  K 3.5 2.6* 3.7 3.8 4.1 3.3*  CL 96* 92* 95* 94* 95* 89*  CO2 33* 37* 38* 36* 33* 39*  GLUCOSE 140* 204* 116* 122* 123* 146*  BUN 29* 20 14 10 9 12   CREATININE 0.70 0.51 0.46 0.49 0.34* 0.49  CALCIUM 8.5* 8.4* 8.4* 8.6* 8.8* 8.7*  MG 2.1 2.2 2.2 2.4 2.5*  --     CBC: Recent Labs  Lab 02/27/23 0533 02/28/23 0449 03/01/23 0523 03/02/23 0436 03/03/23 0452  WBC 12.6* 10.7* 11.0* 11.2* 10.7*  HGB 12.3 12.2 13.3 13.2 12.9  HCT 39.6 41.0 44.6 45.2 42.6  MCV 93.0 96.9 98.2 98.9 95.9  PLT 254 225 238 214 259     Scheduled Meds:  apixaban  5 mg Oral BID   diltiazem  180 mg Oral Daily   docusate sodium  100 mg Oral BID   escitalopram  20 mg Oral Daily   furosemide  40 mg Intravenous Daily   ipratropium-albuterol  3 mL Nebulization TID   nicotine  14 mg Transdermal Daily   mouth rinse  15 mL Mouth Rinse 4 times per day   pantoprazole  40 mg Oral Daily   pneumococcal 20-valent conjugate vaccine  0.5 mL Intramuscular Tomorrow-1000   polyethylene glycol  17 g Oral Daily   pravastatin  10 mg Oral q1800   Ensure Max Protein  11 oz Oral BID   Continuous Infusions:  sodium chloride Stopped (02/27/23 1558)     Assessment & Plan:   Decompensated HFpEF HOCM atrial fibrillation with RVR Patient with HFpEF and elevated heart rates are contributing to decompensation heart failure and AHRF. Cardiology consulted, HR better controlled and has been getting IV diuresis with improvement.  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 She has a significant smoking history but no prior PFTs. She continues to have significant wheezing on exam.  -Continue treatment with inhaled  duo-nebs -Supportive treatment for parainfluenza -As needed and at bedtime BiPAP -Outpatient OSA and pulmonary follow-up -diuresis as above -NRT  HTN -Blood pressure now with good control on Lasix and Cardizem -HCTZ/lisinopril is on hold  Anxiety and depression -continue Escitalopram  HLD -continue pravastatin 10 mg  GERD -continue PPI  DVT prophylaxis: Lovenox Code Status: DNR Family Communication: None today   Studies: No results found.  Principal Problem:   Acute respiratory acidosis (HCC) Active Problems:   Acute respiratory failure (HCC)   Acute respiratory failure with hypoxia and hypercapnia (HCC)   DNR (do not resuscitate)   Goals of care, counseling/discussion   Pressure injury of skin   Parainfluenza infection     Charolotte Eke, [facility]  If 7PM-7AM, please contact night-coverage [email or web address]   LOS: 8 days

## 2023-03-05 DIAGNOSIS — J9602 Acute respiratory failure with hypercapnia: Secondary | ICD-10-CM | POA: Diagnosis not present

## 2023-03-05 DIAGNOSIS — I480 Paroxysmal atrial fibrillation: Secondary | ICD-10-CM

## 2023-03-05 LAB — BASIC METABOLIC PANEL
Anion gap: 7 (ref 5–15)
BUN: 14 mg/dL (ref 8–23)
CO2: 39 mmol/L — ABNORMAL HIGH (ref 22–32)
Calcium: 8.9 mg/dL (ref 8.9–10.3)
Chloride: 89 mmol/L — ABNORMAL LOW (ref 98–111)
Creatinine, Ser: 0.52 mg/dL (ref 0.44–1.00)
GFR, Estimated: >60 mL/min (ref >60)
Glucose, Bld: 159 mg/dL — ABNORMAL HIGH (ref 70–99)
Potassium: 3.1 mmol/L — ABNORMAL LOW (ref 3.5–5.1)
Sodium: 135 mmol/L (ref 135–145)

## 2023-03-05 LAB — CBC
HCT: 44.4 % (ref 36.0–46.0)
Hemoglobin: 13.4 g/dL (ref 12.0–15.0)
MCH: 28.8 pg (ref 26.0–34.0)
MCHC: 30.2 g/dL (ref 30.0–36.0)
MCV: 95.5 fL (ref 80.0–100.0)
Platelets: 268 10*3/uL (ref 150–400)
RBC: 4.65 MIL/uL (ref 3.87–5.11)
RDW: 15.5 % (ref 11.5–15.5)
WBC: 11.1 10*3/uL — ABNORMAL HIGH (ref 4.0–10.5)
nRBC: 0 % (ref 0.0–0.2)

## 2023-03-05 LAB — MAGNESIUM: Magnesium: 2.3 mg/dL (ref 1.7–2.4)

## 2023-03-05 LAB — T4, FREE: Free T4: 1.04 ng/dL (ref 0.61–1.12)

## 2023-03-05 MED ORDER — POTASSIUM CHLORIDE CRYS ER 20 MEQ PO TBCR
40.0000 meq | EXTENDED_RELEASE_TABLET | Freq: Once | ORAL | Status: AC
  Administered 2023-03-05: 40 meq via ORAL
  Filled 2023-03-05: qty 2

## 2023-03-05 MED ORDER — FUROSEMIDE 40 MG PO TABS
40.0000 mg | ORAL_TABLET | Freq: Every day | ORAL | Status: DC
  Administered 2023-03-05 – 2023-03-09 (×5): 40 mg via ORAL
  Filled 2023-03-05 (×5): qty 1

## 2023-03-05 MED ORDER — DILTIAZEM HCL ER COATED BEADS 120 MG PO CP24
120.0000 mg | ORAL_CAPSULE | Freq: Every day | ORAL | Status: DC
  Administered 2023-03-05 – 2023-03-10 (×6): 120 mg via ORAL
  Filled 2023-03-05 (×7): qty 1

## 2023-03-05 NOTE — Progress Notes (Addendum)
PROGRESS NOTE    Kimberly Love  JXB:[id number]  DOB: 27-Apr-1951  DOA: 02/24/2023 PCP: Kimberly Stabile, MD Outpatient Specialists:   Hospital course: 72 yo PMH mood disorder HTN,  GERD, tobacco use, diastolic HF (last echo 2013) s/p cataract surgery was admitted with acute on chronic hypoxic respiratory failure requiring intubation.  Patient was treated in ICU and workup ultimately revealed parainfluenza virus pneumonia.  She was ruled out for pulmonary emboli.  Patient was extubated on 7/1 and her CODE STATUS is confirmed to be DNR.  Patient was transferred out of ICU with improvement in respiratory function.   Subjective:  SOB is improved. Wheezing improved. Not on oxygen at home.   Objective: Vitals:   03/05/23 0445 03/05/23 0500 03/05/23 0824 03/05/23 1002  BP:    (!) 131/59  Pulse: 74   66  Resp: 20   18  Temp: 97.8 F (36.6 C)   97.7 F (36.5 C)  TempSrc: Oral   Oral  SpO2: 92%  95% 96%  Weight:  119 kg    Height:        Intake/Output Summary (Last 24 hours) at 03/05/2023 1246 Last data filed at 03/05/2023 0445 Gross per 24 hour  Intake --  Output 300 ml  Net -300 ml   Filed Weights   03/04/23 0702 03/04/23 1700 03/05/23 0500  Weight: 118.3 kg 118.3 kg 119 kg     Exam:  General:NAD Eyes: sclera anicteric, conjuctiva mild injection bilaterally CVS: S1-S2, irregular  Respiratory: Lungs with transmitted upper respiratory sounds, bronchial in nature but they do sound wet, no clear rales or wheezes. GI: NABS, soft, NT  LE: Warm and well-perfused Neuro: A/O x 3,  grossly nonfocal.  Psych: patient is logical and coherent, judgement and insight appear normal, mood and affect appropriate to situation.  Data Reviewed:  Basic Metabolic Panel: Recent Labs  Lab 02/27/23 0533 02/28/23 0449 03/01/23 0523 03/02/23 0436 03/04/23 0551 03/05/23 0740  NA 138 142 141 139 138 135  K 2.6* 3.7 3.8 4.1 3.3* 3.1*  CL 92* 95* 94* 95* 89* 89*  CO2 37* 38* 36* 33* 39*  39*  GLUCOSE 204* 116* 122* 123* 146* 159*  BUN 20 14 10 9 12 14   CREATININE 0.51 0.46 0.49 0.34* 0.49 0.52  CALCIUM 8.4* 8.4* 8.6* 8.8* 8.7* 8.9  MG 2.2 2.2 2.4 2.5*  --  2.3    CBC: Recent Labs  Lab 02/28/23 0449 03/01/23 0523 03/02/23 0436 03/03/23 0452 03/05/23 0740  WBC 10.7* 11.0* 11.2* 10.7* 11.1*  HGB 12.2 13.3 13.2 12.9 13.4  HCT 41.0 44.6 45.2 42.6 44.4  MCV 96.9 98.2 98.9 95.9 95.5  PLT 225 238 214 259 268     Scheduled Meds:  apixaban  5 mg Oral BID   diltiazem  120 mg Oral Daily   docusate sodium  100 mg Oral BID   escitalopram  20 mg Oral Daily   furosemide  40 mg Oral Daily   ipratropium-albuterol  3 mL Nebulization TID   nicotine  14 mg Transdermal Daily   mouth rinse  15 mL Mouth Rinse 4 times per day   pantoprazole  40 mg Oral Daily   pneumococcal 20-valent conjugate vaccine  0.5 mL Intramuscular Tomorrow-1000   polyethylene glycol  17 g Oral Daily   pravastatin  10 mg Oral q1800   Ensure Max Protein  11 oz Oral BID   Continuous Infusions:  sodium chloride Stopped (02/27/23 1558)     Assessment &  Plan:   Decompensated HFpEF HOCM atrial fibrillation with RVR Patient with HFpEF and elevated heart rates are contributing to decompensation heart failure and AHRF. Cardiology consulted, HR better controlled and has been getting diuresis with improvement.  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 Does have a component of OSA. Supportive treatment of parainfluenza with bronchodilators, did not receive any steroid or antibiotic treatment. She has a significant smoking history but no prior PFTs. Wheezing on exam has improved, hoping to wean down to 2-3L today.  -Continue treatment with inhaled bronchodilators -Supportive  treatment for parainfluenza -As needed and at bedtime BiPAP -Outpatient OSA and pulmonary follow-up -diuresis as above -NRT  Hypokalemia 3.1, replete with PO 40  HTN -Blood pressure now with good control on Lasix and Cardizem -HCTZ/lisinopril is on hold  Anxiety and depression Restart Escitalopram  DVT prophylaxis: Lovenox Code Status: DNR Family Communication: None today   Studies: No results found.  Principal Problem:   Acute respiratory acidosis (HCC) Active Problems:   Acute respiratory failure (HCC)   Acute respiratory failure with hypoxia and hypercapnia (HCC)   DNR (do not resuscitate)   Goals of care, counseling/discussion   Pressure injury of skin   Parainfluenza infection   Paroxysmal atrial fibrillation (HCC)     Kimberly Love, [facility]  If 7PM-7AM, please contact night-coverage [email or web address]   LOS: 9 days

## 2023-03-05 NOTE — Progress Notes (Signed)
Pt doesn't tolerate CPAP

## 2023-03-06 DIAGNOSIS — I480 Paroxysmal atrial fibrillation: Secondary | ICD-10-CM | POA: Diagnosis not present

## 2023-03-06 DIAGNOSIS — J9602 Acute respiratory failure with hypercapnia: Secondary | ICD-10-CM | POA: Diagnosis not present

## 2023-03-06 LAB — COMPREHENSIVE METABOLIC PANEL
ALT: 53 U/L — ABNORMAL HIGH (ref 0–44)
AST: 53 U/L — ABNORMAL HIGH (ref 15–41)
Albumin: 3.3 g/dL — ABNORMAL LOW (ref 3.5–5.0)
Alkaline Phosphatase: 52 U/L (ref 38–126)
Anion gap: 10 (ref 5–15)
BUN: 16 mg/dL (ref 8–23)
CO2: 36 mmol/L — ABNORMAL HIGH (ref 22–32)
Calcium: 8.6 mg/dL — ABNORMAL LOW (ref 8.9–10.3)
Chloride: 91 mmol/L — ABNORMAL LOW (ref 98–111)
Creatinine, Ser: 0.44 mg/dL (ref 0.44–1.00)
GFR, Estimated: >60 mL/min (ref >60)
Glucose, Bld: 189 mg/dL — ABNORMAL HIGH (ref 70–99)
Potassium: 3.3 mmol/L — ABNORMAL LOW (ref 3.5–5.1)
Sodium: 137 mmol/L (ref 135–145)
Total Bilirubin: 1.1 mg/dL (ref 0.3–1.2)
Total Protein: 7.7 g/dL (ref 6.5–8.1)

## 2023-03-06 LAB — CBC
HCT: 42 % (ref 36.0–46.0)
Hemoglobin: 12.4 g/dL (ref 12.0–15.0)
MCH: 28.5 pg (ref 26.0–34.0)
MCHC: 29.5 g/dL — ABNORMAL LOW (ref 30.0–36.0)
MCV: 96.6 fL (ref 80.0–100.0)
Platelets: 238 10*3/uL (ref 150–400)
RBC: 4.35 MIL/uL (ref 3.87–5.11)
RDW: 15.6 % — ABNORMAL HIGH (ref 11.5–15.5)
WBC: 10.9 10*3/uL — ABNORMAL HIGH (ref 4.0–10.5)
nRBC: 0 % (ref 0.0–0.2)

## 2023-03-06 LAB — MAGNESIUM: Magnesium: 2.1 mg/dL (ref 1.7–2.4)

## 2023-03-06 MED ORDER — PREDNISONE 20 MG PO TABS
20.0000 mg | ORAL_TABLET | Freq: Every day | ORAL | Status: DC
  Administered 2023-03-06 – 2023-03-11 (×6): 20 mg via ORAL
  Filled 2023-03-06 (×6): qty 1

## 2023-03-06 MED ORDER — POTASSIUM CHLORIDE CRYS ER 20 MEQ PO TBCR
40.0000 meq | EXTENDED_RELEASE_TABLET | Freq: Once | ORAL | Status: AC
  Administered 2023-03-06: 40 meq via ORAL
  Filled 2023-03-06: qty 2

## 2023-03-06 MED ORDER — ENSURE ENLIVE PO LIQD
237.0000 mL | Freq: Two times a day (BID) | ORAL | Status: DC
  Administered 2023-03-07 – 2023-03-10 (×5): 237 mL via ORAL

## 2023-03-06 NOTE — Progress Notes (Signed)
Physical Therapy Treatment Patient Details Name: Kimberly Love MRN: [id number] DOB: 13-Aug-1951 Today's Date: 03/06/2023   History of Present Illness 72 yo who presented to  ED 6/28 w SOB. Sounds like ongoing x 4d. Associated URI sx, cough, DOE. No fever chills. Hypoxic on EMS arrival w sats 70s. CTA chest with no PE, no pleural effusion, mild cardiomegaly, BLL bronchial wall thickening, scatred ASD RUL.PMH mood disorder HTN, GERD, tobacco use, diastolic HF (last echo 2013) s/p cataract surgery presented to [facility]    PT Comments  Pt agreeable to supine exercises, reports fatigued from going to [facility] with nursing earlier. Pt demos ankle pumps and heel slide with LE externally rotated onto bed "this is the butterfly exercise". Cued pt on ankle pumps, SAQ and SLR with verbal cues for decreased momentum and increased AROM as able. Pt fatigues with exercises, cued for pursed lip breathing and avoiding breath holding. Pt Spo2 90-93% with exercises on O2. Pt declines to get to EOB or OOB this session.    Assistance Recommended at Discharge Frequent or constant Supervision/Assistance  If plan is discharge home, recommend the following:  Can travel by private vehicle    A little help with walking and/or transfers;A little help with bathing/dressing/bathroom;Assistance with cooking/housework;Assist for transportation;Help with stairs or ramp for entrance      Equipment Recommendations  None recommended by PT    Recommendations for Other Services       Precautions / Restrictions Precautions Precautions: Fall Precaution Comments: monitor vitals Restrictions Weight Bearing Restrictions: No     Mobility  Bed Mobility               General bed mobility comments: pt declines    Transfers                        Ambulation/Gait                   Stairs             Wheelchair Mobility     Tilt Bed    Modified Rankin (Stroke Patients Only)        Balance                                            Cognition Arousal/Alertness: Awake/alert Behavior During Therapy: WFL for tasks assessed/performed Overall Cognitive Status: Within Functional Limits for tasks assessed                                 General Comments: Pt pleasant, able to follow commands, reports not on O2 at home, reports she is doing bed exercises (able to demo ankle pumps and heel slide with LE externally rotated in butterly position)        Exercises General Exercises - Lower Extremity Ankle Circles/Pumps: AROM, Both, 10 reps, Supine Short Arc Quad: AROM, Strengthening, Both, 10 reps, Supine Straight Leg Raises: AROM, Strengthening, Both, 5 reps    General Comments        Pertinent Vitals/Pain Pain Assessment Pain Assessment: No/denies pain    Home Living                          Prior Function  PT Goals (current goals can now be found in the care plan section) Acute Rehab PT Goals Patient Stated Goal: to go home PT Goal Formulation: With patient Time For Goal Achievement: 03/14/23 Potential to Achieve Goals: Good Progress towards PT goals: Progressing toward goals    Frequency    Min 1X/week      PT Plan Current plan remains appropriate    Co-evaluation              AM-PAC PT "6 Clicks" Mobility   Outcome Measure  Help needed turning from your back to your side while in a flat bed without using bedrails?: A Little Help needed moving from lying on your back to sitting on the side of a flat bed without using bedrails?: A Little Help needed moving to and from a bed to a chair (including a wheelchair)?: A Little Help needed standing up from a chair using your arms (e.g., wheelchair or bedside chair)?: A Little Help needed to walk in hospital room?: A Lot Help needed climbing 3-5 steps with a railing? : Total 6 Click Score: 15    End of Session Equipment Utilized During  Treatment: Oxygen Activity Tolerance: Patient limited by fatigue Patient left: in bed;with call bell/phone within reach;with bed alarm set Nurse Communication: Mobility status PT Visit Diagnosis: Difficulty in walking, not elsewhere classified (R26.2)     Time: 1610-9604 PT Time Calculation (min) (ACUTE ONLY): 19 min  Charges:    $Therapeutic Exercise: 8-22 mins PT General Charges $$ ACUTE PT VISIT: 1 Visit                     Tori Briah Nary PT, DPT 03/06/23, 1:29 PM

## 2023-03-06 NOTE — Progress Notes (Signed)
Rounding Note    Patient Name: Kimberly Love Date of Encounter: 03/06/2023  [facility] Cardiologist: Dr Ladona Ridgel  Subjective   Denies CP; mild dyspnea  Inpatient Medications    Scheduled Meds:  apixaban  5 mg Oral BID   diltiazem  120 mg Oral Daily   docusate sodium  100 mg Oral BID   escitalopram  20 mg Oral Daily   furosemide  40 mg Oral Daily   ipratropium-albuterol  3 mL Nebulization TID   nicotine  14 mg Transdermal Daily   mouth rinse  15 mL Mouth Rinse 4 times per day   pantoprazole  40 mg Oral Daily   pneumococcal 20-valent conjugate vaccine  0.5 mL Intramuscular Tomorrow-1000   polyethylene glycol  17 g Oral Daily   pravastatin  10 mg Oral q1800   predniSONE  20 mg Oral Q breakfast   Ensure Max Protein  11 oz Oral BID   Continuous Infusions:  sodium chloride Stopped (02/27/23 1558)   PRN Meds: sodium chloride, guaiFENesin, ipratropium-albuterol, melatonin, mouth rinse   Vital Signs    Vitals:   03/06/23 0400 03/06/23 0512 03/06/23 0739 03/06/23 0843  BP: (!) 120/57   (!) 123/52  Pulse: 75  76   Resp: (!) 22  (!) 24   Temp:  97.7 F (36.5 C)    TempSrc:  Oral    SpO2: (!) 89%  95%   Weight:  117.6 kg    Height:        Intake/Output Summary (Last 24 hours) at 03/06/2023 1049 Last data filed at 03/06/2023 0057 Gross per 24 hour  Intake 120 ml  Output 200 ml  Net -80 ml      03/06/2023    5:12 AM 03/05/2023    5:00 AM 03/04/2023    5:00 PM  Last 3 Weights  Weight (lbs) 259 lb 4.2 oz 262 lb 5.6 oz 260 lb 12.8 oz  Weight (kg) 117.6 kg 119 kg 118.298 kg      Telemetry    Sinus with pacs and occasional junctional escape beat - Personally Reviewed   Physical Exam   GEN: NAD, WD Neck: supple, no JVD Cardiac: RRR Respiratory: Diminished BS; no rhonchi GI: Soft, NT/ND MS: No edema Neuro:  no focal findings Psych: Normal affect   Labs    High Sensitivity Troponin:   Recent Labs  Lab 02/24/23 1103 02/24/23 1143  TROPONINIHS  26* 27*     Chemistry Recent Labs  Lab 03/02/23 0436 03/04/23 0551 03/05/23 0740 03/06/23 0422  NA 139 138 135 137  K 4.1 3.3* 3.1* 3.3*  CL 95* 89* 89* 91*  CO2 33* 39* 39* 36*  GLUCOSE 123* 146* 159* 189*  BUN 9 12 14 16   CREATININE 0.34* 0.49 0.52 0.44  CALCIUM 8.8* 8.7* 8.9 8.6*  MG 2.5*  --  2.3 2.1  PROT  --   --   --  7.7  ALBUMIN  --   --   --  3.3*  AST  --   --   --  53*  ALT  --   --   --  53*  ALKPHOS  --   --   --  52  BILITOT  --   --   --  1.1  GFRNONAA >60 >60 >60 >60  ANIONGAP 11 10 7 10     Lipids  No results for input(s): "CHOL", "TRIG", "HDL", "LABVLDL", "LDLCALC", "CHOLHDL" in the last 168 hours.  Hematology Recent  Labs  Lab 03/03/23 0452 03/05/23 0740 03/06/23 0422  WBC 10.7* 11.1* 10.9*  RBC 4.44 4.65 4.35  HGB 12.9 13.4 12.4  HCT 42.6 44.4 42.0  MCV 95.9 95.5 96.6  MCH 29.1 28.8 28.5  MCHC 30.3 30.2 29.5*  RDW 15.7* 15.5 15.6*  PLT 259 268 238   BNP Recent Labs  Lab 03/02/23 0501  BNP 177.7*    Patient Profile     72 y.o. female with past medical history of COPD admitted with pneumonia/ventilator dependent respiratory failure.  Patient developed atrial fibrillation and cardiology asked to evaluate.  Echocardiogram technically difficult but showed normal LV function and interpreted as severe asymmetric left ventricular hypertrophy.  I reviewed this and did not feel that was accurate.  Assessment & Plan    1 paroxysmal atrial fibrillation/flutter-patient has mostly converted to sinus rhythm, but having some short spells of afib.  Heart rate mildly decreased at times.  Continue Cardizem CD to 120 mg daily.  Continue apixaban. Aggressive potassium repletion to >4.0 would be helpful.  2 acute diastolic congestive heart failure-patient remains euvolemic.  Continue lasix 40 mg po daily- remains net negative. Creatinine stable.  3 left ventricular hypertrophy-interpreted as severe asymmetric septal hypertrophy, however, review of the study  does not demonstrate severe septal hypertrophy or LVOT obstruction.  4 COPD/pneumonia-Per primary care.  5. Hypokalemia - persistent, normal magnesium. Will likely need ongoing repletion per hospital medicine to K>4.0.  [facility] will sign off.   Medication Recommendations:  as above Other recommendations (labs, testing, etc):  none Follow up as an outpatient:  Dr. Ladona Ridgel or APP   For questions or updates, please contact [facility] Please consult [email or web address] for contact info under   Chrystie Nose, MD, Milagros Loll  [place]  [facility]  Medical Director of the [facility]  [facility] Diplomate of the American Board of Clinical Lipidology Attending Cardiologist  Direct Dial: [phone]  Fax: [phone]  Website:  [email or web address]  Chrystie Nose, MD  03/06/2023, 10:49 AM

## 2023-03-06 NOTE — Progress Notes (Signed)
PROGRESS NOTE Kimberly Love  WUJ:[id number] DOB: [birth year] DOA: 02/24/2023 PCP: Benita Stabile, MD  Brief Narrative/Hospital Course: 72 yo PMH mood disorder HTN, GERD, tobacco use, diastolic HF (last echo 2013) s/p cataract surgery was admitted with acute on chronic hypoxic respiratory failure requiring intubation. Patient was treated in ICU and workup ultimately revealed parainfluenza virus pneumonia. She was ruled out for pulmonary emboli. Patient was extubated on 7/1 and her CODE STATUS is confirmed to be DNR. Patient was transferred out of ICU with improvement in respiratory function.  Cardiology following and managing for HFpEF and A-fib. Patient continues to need oxygen.    Subjective: Seen examined this morning. 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  Cardiology signing off today. Net IO Since Admission: -5,774.62 mL [03/06/23 1141]   A-fib with RVR: Rate controlled continue Cardizem 120 mg, Eliquis.  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  With ongoing smoking history no prior PFTs.  She has had wheezing on exam but overall  improving is tolerating nasal cannula at 5 L/day steroid trial prednisone 20 mg for few days, continue supplemental oxygen  for spo2>91% or above, bedtime BiPAP, supportive care antitussive. Will need follow-up with pulmonary for OSA eval  Hypokalemia: repleted again Recent Labs  Lab 02/28/23 0449 03/01/23 0523 03/02/23 0436 03/04/23 0551 03/05/23 0740 03/06/23 0422  K 3.7 3.8 4.1 3.3* 3.1* 3.3*  CALCIUM 8.4* 8.6* 8.8* 8.7* 8.9 8.6*  MG 2.2 2.4 2.5*  --  2.3 2.1    No results found for: "PTH"  Hypertension: BP fairly controlled, on Cardizem 120, Lasix.HCTZ/lisinopril is on hold  HLD: continue pravastatin  Anxiety/depression: continue Lexapro, supportive care  Mild transaminitis monitor  Morbid obesity with Body mass index is 41.22 kg/m. : Will benefit with PCP follow-up, weight loss  healthy lifestyle and outpatient sleep evaluation.  Pressure injury of buttock stage I POA see below: Pressure Injury 02/24/23 Buttocks Stage 1 -  Intact skin with non-blanchable redness of a localized area usually over a bony prominence. non-blanachable area between buttocks (Active)  02/24/23 1837  Location: Buttocks  Location Orientation:   Staging: Stage 1 -  Intact skin with non-blanchable redness of a localized area usually over a bony prominence.  Wound Description (Comments): non-blanachable area between buttocks  Present on Admission: Yes  Dressing Type Foam - Lift dressing to assess site every shift 03/06/23 0730    DVT prophylaxis:  Code Status:   Code Status: DNR Family Communication: plan of care discussed with patient at bedside. Patient status is: Inpatient because of respiratory failure Level of care: Progressive   Dispo: The patient is from: home            Anticipated disposition: Home with home health  once oxygenation improves  Objective: Vitals last 24 hrs: Vitals:   03/06/23 0400 03/06/23 0512 03/06/23 0739 03/06/23 0843  BP: (!) 120/57   (!) 123/52  Pulse:  75  76   Resp: (!) 22  (!) 24   Temp:  97.7 F (36.5 C)    TempSrc:  Oral    SpO2: (!) 89%  95%   Weight:  117.6 kg    Height:       Weight change: -0.69 kg  Physical Examination: General exam: alert awake, older than stated age HEENT:Oral mucosa moist, Ear/Nose WNL grossly Respiratory system: bilaterally diminished with mild wheezing, no use of accessory muscle Cardiovascular system: S1 & S2 +, No JVD. Gastrointestinal system: Abdomen soft,NT,ND, BS+ Nervous System:Alert, awake, moving extremities. Extremities: LE edema +,distal peripheral pulses palpable.  Skin: No rashes,no icterus. MSK: Normal muscle bulk,tone, power  Medications reviewed:  Scheduled Meds:  apixaban  5 mg Oral BID   diltiazem  120 mg Oral Daily   docusate sodium  100 mg Oral BID   escitalopram  20 mg Oral Daily   furosemide  40 mg Oral Daily   ipratropium-albuterol  3 mL Nebulization TID   nicotine  14 mg Transdermal Daily   mouth rinse  15 mL Mouth Rinse 4 times per day   pantoprazole  40 mg Oral Daily   pneumococcal 20-valent conjugate vaccine  0.5 mL Intramuscular Tomorrow-1000   polyethylene glycol  17 g Oral Daily   pravastatin  10 mg Oral q1800   predniSONE  20 mg Oral Q breakfast   Ensure Max Protein  11 oz Oral BID   Continuous Infusions:  sodium chloride Stopped (02/27/23 1558)     Diet Order             Diet regular Room service appropriate? Yes; Fluid consistency: Thin  Diet effective now                 Nutrition Problem: Increased nutrient needs Etiology: acute illness Signs/Symptoms: estimated needs Interventions: Refer to RD note for recommendations, Premier Protein   Intake/Output Summary (Last 24 hours) at 03/06/2023 1140 Last data filed at 03/06/2023 1137 Gross per 24 hour  Intake 240 ml  Output 200 ml  Net 40 ml   Net IO Since Admission: -5,774.62 mL [03/06/23 1140]  Wt Readings from Last 3 Encounters:  03/06/23 117.6 kg  01/31/20 117.9 kg  01/29/20 117.9 kg      Unresulted Labs (From admission, onward)    None      Data Reviewed: I have personally reviewed following labs and imaging studies CBC: Recent Labs  Lab 03/01/23 0523 03/02/23 0436 03/03/23 0452 03/05/23 0740 03/06/23 0422  WBC 11.0* 11.2* 10.7* 11.1* 10.9*  HGB 13.3 13.2 12.9 13.4 12.4  HCT 44.6 45.2 42.6 44.4 42.0  MCV 98.2 98.9 95.9 95.5 96.6  PLT 238 214 259 268 238   Basic Metabolic Panel: Recent Labs  Lab 02/28/23 0449 03/01/23 0523 03/02/23 0436 03/04/23 0551 03/05/23 0740 03/06/23 0422  NA 142 141 139 138 135 137  K 3.7 3.8 4.1 3.3* 3.1* 3.3*  CL 95* 94* 95* 89* 89* 91*  CO2 38* 36* 33* 39* 39* 36*  GLUCOSE 116* 122* 123* 146* 159* 189*  BUN 14 10 9 12 14 16   CREATININE 0.46 0.49 0.34* 0.49 0.52 0.44  CALCIUM 8.4* 8.6* 8.8* 8.7* 8.9 8.6*  MG 2.2 2.4 2.5*  --  2.3 2.1   GFR: Estimated Creatinine Clearance: 83.6 mL/min (by C-G formula based on  SCr of 0.44 mg/dL). Liver Function Tests: Recent Labs  Lab 03/06/23 0422  AST 53*  ALT 53*  ALKPHOS 52  BILITOT 1.1  PROT 7.7  ALBUMIN 3.3*   CBG: Recent Labs  Lab 03/01/23 1954 03/02/23 0017 03/02/23 0436 03/02/23 0729 03/02/23 1104  GLUCAP 151* 127* 139* 127* 185*   Lipid Profile: No results for input(s): "CHOL", "HDL", "LDLCALC", "TRIG", "CHOLHDL", "LDLDIRECT" in the last 72 hours. Thyroid Function Tests: Recent Labs    03/05/23 0740  FREET4 1.04   Sepsis Labs: No results for input(s): "PROCALCITON", "LATICACIDVEN" in the last 168 hours.  Recent Results (from the past 240 hour(s))  Respiratory (~20 pathogens) panel by PCR     Status: Abnormal   Collection Time: 02/24/23  5:11 PM   Specimen: Nasopharyngeal Swab; Respiratory  Result Value Ref Range Status   Adenovirus NOT DETECTED NOT DETECTED Final   Coronavirus 229E NOT DETECTED NOT DETECTED Final    Comment: (NOTE) The Coronavirus on the Respiratory Panel, DOES NOT test for the novel  Coronavirus (2019 nCoV)    Coronavirus HKU1  NOT DETECTED NOT DETECTED Final   Coronavirus NL63 NOT DETECTED NOT DETECTED Final   Coronavirus OC43 NOT DETECTED NOT DETECTED Final   Metapneumovirus NOT DETECTED NOT DETECTED Final   Rhinovirus / Enterovirus NOT DETECTED NOT DETECTED Final   Influenza A NOT DETECTED NOT DETECTED Final   Influenza B NOT DETECTED NOT DETECTED Final   Parainfluenza Virus 1 NOT DETECTED NOT DETECTED Final   Parainfluenza Virus 2 NOT DETECTED NOT DETECTED Final   Parainfluenza Virus 3 DETECTED (A) NOT DETECTED Final   Parainfluenza Virus 4 NOT DETECTED NOT DETECTED Final   Respiratory Syncytial Virus NOT DETECTED NOT DETECTED Final   Bordetella pertussis NOT DETECTED NOT DETECTED Final   Bordetella Parapertussis NOT DETECTED NOT DETECTED Final   Chlamydophila pneumoniae NOT DETECTED NOT DETECTED Final   Mycoplasma pneumoniae NOT DETECTED NOT DETECTED Final    Comment: Performed at [facility], [address], [place], [place] [address]  MRSA Next Gen by PCR, Nasal     Status: None   Collection Time: 02/24/23  9:17 PM   Specimen: Nasal Mucosa; Nasal Swab  Result Value Ref Range Status   MRSA by PCR Next Gen NOT DETECTED NOT DETECTED Final    Comment: (NOTE) The GeneXpert MRSA Assay (FDA approved for NASAL specimens only), is one component of a comprehensive MRSA colonization surveillance program. It is not intended to diagnose MRSA infection nor to guide or monitor treatment for MRSA infections. Test performance is not FDA approved in patients less than 67 years old. Performed at [facility], [address] [place], [place] [address]   Expectorated Sputum Assessment w Gram Stain, Rflx to Resp Cult     Status: None   Collection Time: 02/25/23 11:54 PM   Specimen: SPU  Result Value Ref Range Status   Specimen Description SPUTUM  Final   Special Requests NONE  Final   Sputum evaluation   Final    THIS SPECIMEN IS ACCEPTABLE FOR SPUTUM CULTURE Performed at [facility], [address], [place], [place] [address]    Report Status 02/26/2023 FINAL  Final  Culture, Respiratory w Gram Stain     Status: None   Collection Time: 02/25/23 11:54 PM   Specimen: SPU  Result Value Ref Range Status   Specimen Description   Final    SPUTUM Performed at [facility], [address] [place], [place] [address]    Special Requests   Final    NONE Reflexed from [id number] Performed at [facility], [address], [place], [place] [address]    Gram Stain NO WBC SEEN NO ORGANISMS SEEN   Final   Culture   Final    NO GROWTH 2 DAYS Performed at [facility], [address], [place], [place] [address]    Report Status 02/28/2023 FINAL  Final    Antimicrobials: Anti-infectives (From admission, onward)    Start     Dose/Rate Route Frequency Ordered Stop   02/25/23 1130  azithromycin (ZITHROMAX) 500 mg in sodium chloride 0.9 % 250 mL IVPB  Status:  Discontinued        500 mg 250 mL/hr over 60 Minutes Intravenous Every 24 hours 02/24/23 1723 02/25/23 0652   02/25/23 1030  cefTRIAXone (ROCEPHIN) 2 g in sodium chloride 0.9 % 100 mL IVPB  Status:  Discontinued        2 g 200 mL/hr over 30 Minutes Intravenous Every 24 hours 02/24/23 1723 02/25/23 0652   02/24/23 1030  cefTRIAXone (ROCEPHIN) 1 g in sodium chloride 0.9 % 100 mL IVPB        1 g 200 mL/hr over 30 Minutes Intravenous  Once 02/24/23 1027 02/24/23 1140   02/24/23 1030  azithromycin (ZITHROMAX) 500 mg in sodium chloride 0.9 % 250 mL IVPB        500 mg 250 mL/hr over 60 Minutes Intravenous  Once 02/24/23 1027 02/24/23 1235      Culture/Microbiology    Component Value Date/Time   SDES SPUTUM 02/25/2023 2354   SDES  02/25/2023 2354    SPUTUM Performed at [facility], [address], [place], [place] [address]    SPECREQUEST NONE 02/25/2023 2354   SPECREQUEST  02/25/2023 2354    NONE Reflexed from [id number] Performed at [facility], [address], [place], [place] [address]    CULT  02/25/2023 2354    NO GROWTH 2 DAYS Performed at [facility], [address], [place], [place] [address]    REPTSTATUS 02/26/2023 FINAL 02/25/2023 2354   REPTSTATUS 02/28/2023 FINAL 02/25/2023 2354   Radiology Studies: No results found.   LOS: 10 days   Lanae Boast, MD [facility]  03/06/2023, 11:40 AM

## 2023-03-06 NOTE — Hospital Course (Addendum)
72 yo PMH mood disorder HTN, GERD, tobacco use, diastolic HF (last echo 2013) s/p cataract surgery was admitted with acute on chronic hypoxic respiratory failure requiring intubation. Patient was treated in ICU and workup ultimately revealed parainfluenza virus pneumonia. She was ruled out for pulmonary emboli. Patient was extubated on 7/1 and her CODE STATUS is confirmed to be DNR. Patient was transferred out of ICU with improvement in respiratory function.  Cardiology following and managing for HFpEF and A-fib. Patient continues to need oxygen.

## 2023-03-06 NOTE — Progress Notes (Signed)
OT Cancellation Note  Patient Details Name: Kimberly Love MRN: [id number] DOB: 01/18/1951   Cancelled Treatment:    Reason Eval/Treat Not Completed: Fatigue/lethargy limiting ability to participate Patient sleeping in bed with husband present. Patient did not stir but breathing normally with stable vitals per monitor in room. OT to continue to follow and check back as schedule will allow.  Rosalio Loud, MS Acute Rehabilitation Department Office# [phone]  03/06/2023, 4:10 PM

## 2023-03-06 NOTE — Progress Notes (Signed)
   03/06/23 2200  BiPAP/CPAP/SIPAP  BiPAP/CPAP/SIPAP DREAMSTATIOND  Reason BIPAP/CPAP not in use Non-compliant  Flow Rate 5 lpm (on n/c)   Pt. seen on rounds, stated she was unable to tolerate DS CPAP last evening remains on 5 lpm n/c, made aware to notify if needed.

## 2023-03-07 ENCOUNTER — Institutional Professional Consult (permissible substitution): Payer: 59 | Admitting: Pulmonary Disease

## 2023-03-07 DIAGNOSIS — J9602 Acute respiratory failure with hypercapnia: Secondary | ICD-10-CM | POA: Diagnosis not present

## 2023-03-07 LAB — COMPREHENSIVE METABOLIC PANEL
ALT: 55 U/L — ABNORMAL HIGH (ref 0–44)
AST: 55 U/L — ABNORMAL HIGH (ref 15–41)
Albumin: 3.3 g/dL — ABNORMAL LOW (ref 3.5–5.0)
Alkaline Phosphatase: 48 U/L (ref 38–126)
Anion gap: 10 (ref 5–15)
BUN: 15 mg/dL (ref 8–23)
CO2: 39 mmol/L — ABNORMAL HIGH (ref 22–32)
Calcium: 8.9 mg/dL (ref 8.9–10.3)
Chloride: 92 mmol/L — ABNORMAL LOW (ref 98–111)
Creatinine, Ser: 0.43 mg/dL — ABNORMAL LOW (ref 0.44–1.00)
GFR, Estimated: >60 mL/min (ref >60)
Glucose, Bld: 107 mg/dL — ABNORMAL HIGH (ref 70–99)
Potassium: 3.7 mmol/L (ref 3.5–5.1)
Sodium: 141 mmol/L (ref 135–145)
Total Bilirubin: 0.8 mg/dL (ref 0.3–1.2)
Total Protein: 7.6 g/dL (ref 6.5–8.1)

## 2023-03-07 NOTE — Progress Notes (Signed)
Nutrition Follow-up  DOCUMENTATION CODES:   Morbid obesity  INTERVENTION:   -Ensure Plus High Protein po BID, each supplement provides 350 kcal and 20 grams of protein.   NUTRITION DIAGNOSIS:   Increased nutrient needs related to acute illness as evidenced by estimated needs.  Ongoing.  GOAL:   Patient will meet greater than or equal to 90% of their needs  Progressing.  MONITOR:   PO intake, Weight trends, Diet advancement  ASSESSMENT:   72 yo female PMH mood disorder, HTN, GERD, tobacco use, diastolic HF who presented for SOB.   6/28 Admit 6/29 Intubated 6/30 TF of Vital 1.2 started 7/1 Extubated  Patient currently consuming 10-50% of meals at this time. Is accepting Ensure supplements as well. Will continue given poor PO.   Admission weight: 259 lbs Current weight: 258 lbs  Medications: Colace, Lasix, Miralax, Prednisone  Labs reviewed.  Diet Order:   Diet Order             Diet regular Room service appropriate? Yes; Fluid consistency: Thin  Diet effective now                   EDUCATION NEEDS:   Education needs have been addressed  Skin:  Skin Assessment: Skin Integrity Issues: Skin Integrity Issues:: Stage I Stage I: Buttocks  Last BM:  7/9 -type 6  Height:   Ht Readings from Last 1 Encounters:  02/24/23 5' 6.5" (1.689 m)    Weight:   Wt Readings from Last 1 Encounters:  03/07/23 117.4 kg    BMI:  Body mass index is 41.15 kg/m.  Estimated Nutritional Needs:   Kcal:  1750-1950  Protein:  90-100g  Fluid:  1.7-1.9L/day  Tilda Franco, MS, RD, LDN Inpatient Clinical Dietitian Contact information available via Amion

## 2023-03-07 NOTE — Progress Notes (Signed)
RN went to ambulate the Patient and Patient refusing mobility. Patient states "I was up in the chair earlier and took some steps in the room getting back in the bed. I might try later" RN discussed the importance of mobility however the Patient refuses

## 2023-03-07 NOTE — Progress Notes (Signed)
PROGRESS NOTE Kimberly Love  ZOX:[id number] DOB: 02/27/1951 DOA: 02/24/2023 PCP: Benita Stabile, MD  Brief Narrative/Hospital Course: 72 yo PMH mood disorder HTN, GERD, tobacco use, diastolic HF (last echo 2013) s/p cataract surgery was admitted with acute on chronic hypoxic respiratory failure requiring intubation. Patient was treated in ICU and workup ultimately revealed parainfluenza virus pneumonia. She was ruled out for pulmonary emboli. Patient was extubated on 7/1 and her CODE STATUS is confirmed to be DNR. Patient was transferred out of ICU with improvement in respiratory function.  Cardiology following and managing for HFpEF and A-fib. Patient continues to need oxygen.    Subjective: Patient seen and examined this morning  On the bedside chair still on 5 L nasal cannula overall feeling much better able to take deep breaths he says    Assessment and Plan: Principal Problem:   Acute respiratory acidosis (HCC) Active Problems:   Acute respiratory failure (HCC)   Acute respiratory failure with hypoxia and hypercapnia (HCC)   DNR (do not resuscitate)   Goals of care, counseling/discussion   Pressure injury of skin   Parainfluenza infection   Paroxysmal atrial fibrillation (HCC)  Acute diastolic CHF HOCM-severe asymmetric septal hypertrophy on echo: Patient with elevated heart rate contributing to decompensated CHF, seen by cardiology now on oral Lasix and doing well cardiology signed off.  Net IO Since Admission: -5,744.62 mL [03/07/23 1100]   A-fib with RVR: Rate controlled continue Cardizem 120 mg, Eliquis.  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  With ongoing smoking history no prior PFTs.  She has had wheezing on exam  but overall improving is tolerating nasal cannula at 5 L/day started on trial of prednisone 20 mg 7/8, not able to take deeper breath, continue PT OT ambulation wean oxygen once close to 2 L patient planning discharge home with pulmonary follow-up  Cont bedtime BiPAP. Will need follow-up with pulmonary for OSA eval  Hypokalemia: repleted again Recent Labs  Lab 03/01/23 0523 03/02/23 0436 03/04/23 0551 03/05/23 0740 03/06/23 0422 03/07/23 0523  K 3.8 4.1 3.3* 3.1* 3.3* 3.7  CALCIUM 8.6* 8.8* 8.7* 8.9 8.6* 8.9  MG 2.4 2.5*  --  2.3 2.1  --     Hypertension: BP fairly controlled, on Cardizem 120, Lasix.HCTZ/lisinopril is on hold  HLD: continue pravastatin  Anxiety/depression: continue Lexapro, supportive care  Mild transaminitis monitor LFTs.  Morbid obesity with Body mass index is 41.15 kg/m. : Will benefit with PCP follow-up, weight loss  healthy lifestyle and outpatient sleep evaluation.  Pressure injury of buttock stage I POA see below: Pressure Injury 02/24/23 Buttocks Stage 1 -  Intact skin with non-blanchable redness of a localized area usually over a bony prominence. non-blanachable area between buttocks (Active)  02/24/23 1837  Location: Buttocks  Location Orientation:   Staging: Stage 1 -  Intact skin with non-blanchable redness of a localized area usually over a bony prominence.  Wound Description (Comments): non-blanachable area between buttocks  Present on Admission: Yes  Dressing Type Foam - Lift dressing to assess site every shift 03/06/23 0730    DVT prophylaxis: Eliquis Code Status:   Code Status: DNR Family Communication: plan of care discussed with patient at bedside. Patient status is: Inpatient because of respiratory failure Level of care: Progressive   Dispo: The patient is from: home  Anticipated disposition: Home with home health  once oxygenation improves   Objective: Vitals last 24 hrs: Vitals:   03/07/23 0548 03/07/23 0550  03/07/23 0805 03/07/23 0936  BP: (!) 99/58   105/60  Pulse: 71     Resp: 19     Temp: (!) 97.5 F (36.4 C)     TempSrc: Oral     SpO2: 96%  92%   Weight:  117.4 kg    Height:       Weight change: -0.2 kg  Physical Examination: General exam: alert awake, obese, oriented at baseline, older than stated age HEENT:Oral mucosa moist, Ear/Nose WNL grossly Respiratory system: Bilaterally clear BS,no use of accessory muscle Cardiovascular system: S1 & S2 +, No JVD. Gastrointestinal system: Abdomen soft,NT,ND, BS+ Nervous System: Alert, awake, moving all extremities,and following commands. Extremities: LE edema neg, skin wrinkling and hyperpigmentation +, distal peripheral pulses palpable and warm.  Skin: No rashes,no icterus. MSK: Normal muscle bulk,tone, power   Medications reviewed:  Scheduled Meds:  apixaban  5 mg Oral BID   diltiazem  120 mg Oral Daily   docusate sodium  100 mg Oral BID   escitalopram  20 mg Oral Daily   feeding supplement  237 mL Oral BID BM   furosemide  40 mg Oral Daily   ipratropium-albuterol  3 mL Nebulization TID   nicotine  14 mg Transdermal Daily   mouth rinse  15 mL Mouth Rinse 4 times per day   pantoprazole  40 mg Oral Daily   pneumococcal 20-valent conjugate vaccine  0.5 mL Intramuscular Tomorrow-1000   polyethylene glycol  17 g Oral Daily   pravastatin  10 mg Oral q1800   predniSONE  20 mg Oral Q breakfast   Ensure Max Protein  11 oz Oral BID   Continuous Infusions:  sodium chloride Stopped (02/27/23 1558)     Diet Order             Diet regular Room service appropriate? Yes; Fluid consistency: Thin  Diet effective now                 Nutrition Problem: Increased nutrient needs Etiology: acute illness Signs/Symptoms: estimated needs Interventions: Refer to RD note for recommendations, Premier [employer]   Intake/Output Summary (Last 24 hours) at 03/07/2023 1100 Last data filed at 03/07/2023 0900 Gross per 24 hour  Intake 600 ml   Output 450 ml  Net 150 ml    Net IO Since Admission: -5,744.62 mL [03/07/23 1100]  Wt Readings from Last 3 Encounters:  03/07/23 117.4 kg  01/31/20 117.9 kg  01/29/20 117.9 kg     Unresulted Labs (From admission, onward)     Start     Ordered   03/07/23 0500  Comprehensive metabolic panel  Daily,   R     Question:  Specimen collection method  Answer:  Lab=Lab collect   03/06/23 1143           Data Reviewed: I have personally reviewed following labs and imaging studies CBC: Recent Labs  Lab 03/01/23 0523 03/02/23 0436 03/03/23 0452 03/05/23 0740 03/06/23 0422  WBC 11.0* 11.2* 10.7* 11.1* 10.9*  HGB 13.3 13.2 12.9 13.4 12.4  HCT 44.6 45.2 42.6 44.4 42.0  MCV 98.2 98.9 95.9 95.5 96.6  PLT 238 214 259 268 238    Basic Metabolic Panel: Recent Labs  Lab 03/01/23 0523 03/02/23 0436 03/04/23 0551 03/05/23 0740 03/06/23 0422 03/07/23 0523  NA 141 139 138 135 137 141  K  3.8 4.1 3.3* 3.1* 3.3* 3.7  CL 94* 95* 89* 89* 91* 92*  CO2 36* 33* 39* 39* 36* 39*  GLUCOSE 122* 123* 146* 159* 189* 107*  BUN 10 9 12 14 16 15   CREATININE 0.49 0.34* 0.49 0.52 0.44 0.43*  CALCIUM 8.6* 8.8* 8.7* 8.9 8.6* 8.9  MG 2.4 2.5*  --  2.3 2.1  --     GFR: Estimated Creatinine Clearance: 83.6 mL/min (A) (by C-G formula based on SCr of 0.43 mg/dL (L)). Liver Function Tests: Recent Labs  Lab 03/06/23 0422 03/07/23 0523  AST 53* 55*  ALT 53* 55*  ALKPHOS 52 48  BILITOT 1.1 0.8  PROT 7.7 7.6  ALBUMIN 3.3* 3.3*    CBG: Recent Labs  Lab 03/01/23 1954 03/02/23 0017 03/02/23 0436 03/02/23 0729 03/02/23 1104  GLUCAP 151* 127* 139* 127* 185*    Lipid Profile: No results for input(s): "CHOL", "HDL", "LDLCALC", "TRIG", "CHOLHDL", "LDLDIRECT" in the last 72 hours. Thyroid Function Tests: Recent Labs    03/05/23 0740  FREET4 1.04    Sepsis Labs: No results for input(s): "PROCALCITON", "LATICACIDVEN" in the last 168 hours.  Recent Results (from the past 240 hour(s))   Expectorated Sputum Assessment w Gram Stain, Rflx to Resp Cult     Status: None   Collection Time: 02/25/23 11:54 PM   Specimen: SPU  Result Value Ref Range Status   Specimen Description SPUTUM  Final   Special Requests NONE  Final   Sputum evaluation   Final    THIS SPECIMEN IS ACCEPTABLE FOR SPUTUM CULTURE Performed at [facility], [address], [place], [place] [address]    Report Status 02/26/2023 FINAL  Final  Culture, Respiratory w Gram Stain     Status: None   Collection Time: 02/25/23 11:54 PM   Specimen: SPU  Result Value Ref Range Status   Specimen Description   Final    SPUTUM Performed at [facility], [address] [place], [place] [address]    Special Requests   Final    NONE Reflexed from [id number] Performed at [facility], [address], [place], [place] [address]    Gram Stain NO WBC SEEN NO ORGANISMS SEEN   Final   Culture   Final    NO GROWTH 2 DAYS Performed at [facility], [address] [place], [place] [address]    Report Status 02/28/2023 FINAL  Final    Antimicrobials: Anti-infectives (From admission, onward)    Start     Dose/Rate Route Frequency Ordered Stop   02/25/23 1130  azithromycin (ZITHROMAX) 500 mg in sodium chloride 0.9 % 250 mL IVPB  Status:  Discontinued        500 mg 250 mL/hr over 60 Minutes Intravenous Every 24 hours 02/24/23 1723 02/25/23 0652   02/25/23 1030  cefTRIAXone (ROCEPHIN) 2 g in sodium chloride 0.9 % 100 mL IVPB  Status:  Discontinued        2 g 200 mL/hr over 30 Minutes Intravenous Every 24 hours 02/24/23 1723 02/25/23 0652   02/24/23 1030  cefTRIAXone (ROCEPHIN) 1 g in sodium chloride 0.9 % 100 mL IVPB        1 g 200 mL/hr over 30 Minutes Intravenous  Once 02/24/23 1027 02/24/23 1140   02/24/23 1030  azithromycin (ZITHROMAX) 500 mg in sodium chloride 0.9 % 250 mL IVPB        500 mg 250 mL/hr over 60 Minutes Intravenous  Once  02/24/23 1027  02/24/23 1235      Culture/Microbiology    Component Value Date/Time   SDES SPUTUM 02/25/2023 2354   SDES  02/25/2023 2354    SPUTUM Performed at [facility], [address], [place], [place] [address]    SPECREQUEST NONE 02/25/2023 2354   SPECREQUEST  02/25/2023 2354    NONE Reflexed from [id number] Performed at [facility], [address] [place], [place] [address]    CULT  02/25/2023 2354    NO GROWTH 2 DAYS Performed at [facility], [address], [place], [place] [address]    REPTSTATUS 02/26/2023 FINAL 02/25/2023 2354   REPTSTATUS 02/28/2023 FINAL 02/25/2023 2354   Radiology Studies: No results found.   LOS: 11 days   Lanae Boast, MD [facility]  03/07/2023, 11:00 AM

## 2023-03-07 NOTE — Progress Notes (Signed)
   03/07/23 1943  BiPAP/CPAP/SIPAP  Reason BIPAP/CPAP not in use Non-compliant   Pt refused bipap again tonight.  Pt stated the mask is too uncomfortable.  Bipap machine remains in room as needed.  Pt encouraged to call should she change her mind.  RN aware.

## 2023-03-07 NOTE — Progress Notes (Signed)
OT Cancellation Note  Patient Details Name: CATALIA LEREW MRN: [id number] DOB: 03-29-1951   Cancelled Treatment:    Reason Eval/Treat Not Completed: Patient declined, no reason specified Patient reported that she moved around all day and was not up for participation in therapy at this time. Family in room during this time. OT to continue to follow and check back as schedule will allow.  Rosalio Loud, MS Acute Rehabilitation Department Office# [phone]  03/07/2023, 3:48 PM

## 2023-03-08 DIAGNOSIS — J9602 Acute respiratory failure with hypercapnia: Secondary | ICD-10-CM | POA: Diagnosis not present

## 2023-03-08 LAB — COMPREHENSIVE METABOLIC PANEL
ALT: 53 U/L — ABNORMAL HIGH (ref 0–44)
AST: 43 U/L — ABNORMAL HIGH (ref 15–41)
Albumin: 3.3 g/dL — ABNORMAL LOW (ref 3.5–5.0)
Alkaline Phosphatase: 45 U/L (ref 38–126)
Anion gap: 8 (ref 5–15)
BUN: 16 mg/dL (ref 8–23)
CO2: 38 mmol/L — ABNORMAL HIGH (ref 22–32)
Calcium: 8.9 mg/dL (ref 8.9–10.3)
Chloride: 92 mmol/L — ABNORMAL LOW (ref 98–111)
Creatinine, Ser: 0.48 mg/dL (ref 0.44–1.00)
GFR, Estimated: >60 mL/min (ref >60)
Glucose, Bld: 116 mg/dL — ABNORMAL HIGH (ref 70–99)
Potassium: 3.5 mmol/L (ref 3.5–5.1)
Sodium: 138 mmol/L (ref 135–145)
Total Bilirubin: 0.8 mg/dL (ref 0.3–1.2)
Total Protein: 7.2 g/dL (ref 6.5–8.1)

## 2023-03-08 MED ORDER — IPRATROPIUM-ALBUTEROL 0.5-2.5 (3) MG/3ML IN SOLN
3.0000 mL | Freq: Two times a day (BID) | RESPIRATORY_TRACT | Status: DC
  Administered 2023-03-08 – 2023-03-11 (×5): 3 mL via RESPIRATORY_TRACT
  Filled 2023-03-08 (×6): qty 3

## 2023-03-08 NOTE — Progress Notes (Signed)
Occupational Therapy Treatment Patient Details Name: Kimberly Love MRN: [id number] DOB: Jul 24, 1951 Today's Date: 03/08/2023   History of present illness 72 yo who presented to  ED 6/28 w SOB. Sounds like ongoing x 4d. Associated URI sx, cough, DOE. No fever chills. Hypoxic on EMS arrival w sats 70s. CTA chest with no PE, no pleural effusion, mild cardiomegaly, BLL bronchial wall thickening, scatred ASD RUL.PMH mood disorder HTN, GERD, tobacco use, diastolic HF (last echo 2013) s/p cataract surgery presented to [facility]   OT comments  Patient in room with MD upon entrance to room. Patient was agreeable to participate in therapy with MD encouragement. When patients MD left the room, patient reported that she was supposed to take a nap and then lunch arrived. Patient's MD was able to come back into room and encourage patient with therapist to engage in time out of bed. Patient was min guard to transfer to recliner in room with one hand held assistance with O2 noted to drop to 87% on 2L/min with transfer with SOB noted. Patient declined more ADLs with lunch present. Patient's discharge plan remains appropriate at this time. OT will continue to follow acutely.       Recommendations for follow up therapy are one component of a multi-disciplinary discharge planning process, led by the attending physician.  Recommendations may be updated based on patient status, additional functional criteria and insurance authorization.    Assistance Recommended at Discharge Frequent or constant Supervision/Assistance  Patient can return home with the following  A lot of help with bathing/dressing/bathroom;A lot of help with walking and/or transfers;Direct supervision/assist for financial management;Help with stairs or ramp for entrance;Assist for transportation;Direct supervision/assist for medications management;Assistance with cooking/housework   Equipment Recommendations  None recommended by OT       Precautions  / Restrictions Precautions Precautions: Fall Precaution Comments: monitor vitals Restrictions Weight Bearing Restrictions: No Other Position/Activity Restrictions: monitor O2 saturation and heart rate       Mobility Bed Mobility Overal bed mobility: Needs Assistance Bed Mobility: Supine to Sit     Supine to sit: HOB elevated, Min guard     General bed mobility comments: with increased time            Balance Overall balance assessment: No apparent balance deficits (not formally assessed)             ADL either performed or assessed with clinical judgement   ADL Overall ADL's : Needs assistance/impaired Eating/Feeding: Modified independent;Sitting Eating/Feeding Details (indicate cue type and reason): in recliner with increased encouagement from MD and therapist to get out of bed. patient reported that she was not that bad to get along with that she was tired. patient was again educated on importance of being out  of bed. nurse made aware patient was OOB eating lunch.         General ADL Comments: patient declined to participate in more therapy as lunch had arrived and she did not want it to get cold. MD agreeable to patient getting to chair at this time.      Cognition Arousal/Alertness: Awake/alert Behavior During Therapy: WFL for tasks assessed/performed Overall Cognitive Status: Within Functional Limits for tasks assessed           General Comments: patient was noted to be quick to change her mind about participation in time out of bed when MD left the room  Pertinent Vitals/ Pain       Pain Assessment Pain Assessment: No/denies pain         Frequency  Min 1X/week        Progress Toward Goals  OT Goals(current goals can now be found in the care plan section)  Progress towards OT goals: Not progressing toward goals - comment (self limiting at times during session)     Plan Discharge plan remains appropriate        AM-PAC OT "6 Clicks" Daily Activity     Outcome Measure   Help from another person eating meals?: None Help from another person taking care of personal grooming?: A Little Help from another person toileting, which includes using toliet, bedpan, or urinal?: A Lot Help from another person bathing (including washing, rinsing, drying)?: A Lot Help from another person to put on and taking off regular upper body clothing?: A Little Help from another person to put on and taking off regular lower body clothing?: A Lot 6 Click Score: 16    End of Session Equipment Utilized During Treatment: Oxygen  OT Visit Diagnosis: Muscle weakness (generalized) (M62.81);Unsteadiness on feet (R26.81)   Activity Tolerance Other (comment) (self limiting behaviors)   Patient Left in chair;with call bell/phone within reach   Nurse Communication Mobility status        Time: 1610-9604 OT Time Calculation (min): 13 min  Charges: OT General Charges $OT Visit: 1 Visit OT Treatments $Self Care/Home Management : 8-22 mins  Rosalio Loud, MS Acute Rehabilitation Department Office# [phone]   Selinda Flavin 03/08/2023, 11:34 AM

## 2023-03-08 NOTE — Progress Notes (Signed)
PROGRESS NOTE  Kimberly Love  DOB: 26-Jan-1951  PCP: Benita Stabile, MD ZOX:[id number]  DOA: 02/24/2023  LOS: 12 days  Hospital Day: 13  Brief narrative: Kimberly Love is a 72 y.o. female with PMH significant for morbid obesity, active smoker, HTN, diastolic CHF, CKD, GERD, anxiety/depression, generalized arthritis. 6/28, patient was brought to ED at [facility] by EMS with O2 sat low at 70s, complaint of 4 days of shortness of breath.  CT chest showed acute on chronic bronchitis as well as several small patchy airspace opacities are noted in the right upper lobe concerning for possible multifocal pneumonia. Respiratory virus panel showed parainfluenza. She required BiPAP in the ED, admitted to ICU, ultimately required intubation. 7/1, successfully extubated.  CODE STATUS changed to DNR per family. 7/3, transferred out to [facility] 7/4, cardiology was consulted as well for abnormal echo finding. See below for details  Subjective: Patient was seen and examined this morning.  Patient elderly Caucasian female.  Propped up in bed.  Not in distress.  She needed a lot of encouragement to get out of bed to chair. Chart reviewed In the last 24 hours, remains hemodynamically stable, on 4 L oxygen by nasal cannula Last set of blood work from this morning with no significant change  Assessment and plan: Acute on chronic hypoxic respiratory failure Brought in for shortness of breath for 4 days, low O2 sat at 70s  Multifactorial etiology : morbid obesity, active smoking, history of CHF and possible underlying OSA.  See below further management of individual issues Initially tried on BiPAP, ultimately required intubation, successfully extubated Currently on 4 L oxygen by nasal cannula during the day.  Recommended nightly BiPAP but not compliant with.  Check ambulatory oxygen requirement.  Was not on supplemental oxygen PTA. DNR status currently  Parainfluenza virus pneumonia Respiratory  panel positive for current failure.  CT chest showed multifocal pneumonia. Was initially given broad-spectrum antibiotics in the ED but not continued because of the viral nature of the infection. Recent Labs  Lab 03/02/23 0436 03/03/23 0452 03/05/23 0740 03/06/23 0422  WBC 11.2* 10.7* 11.1* 10.9*   Acute on chronic bronchitis Chronic active smoker Probably has underlying undiagnosed COPD CT chest showed acute on chronic bronchitis. 7/8, started on a course of prednisone 20 mg daily. Counseled to quit   Morbid obesity  Suspected OSA Needs outpatient sleep study Body mass index is 40.86 kg/m. Patient has been advised to make an attempt to improve diet and exercise patterns to aid in weight loss.  Acute exacerbation of diastolic CHF Essential hypertension 6/21, echo read as severe asymmetric septal hypertrophy.  However cardiologist Dr. Rennis Golden reviewed the echo and suggested no evidence of severe septal hypertrophy or LVOT obstruction  Diuresis with IV Lasix.  Currently on oral Lasix.   Net IO Since Admission: -6,604.62 mL [03/08/23 1556] Blood pressure in normal range currently on Cardizem, oral Lasix. Continue to monitor for daily intake output, weight, blood pressure, BNP, renal function and electrolytes. Recent Labs  Lab 03/02/23 0436 03/02/23 0501 03/04/23 0551 03/05/23 0740 03/06/23 0422 03/07/23 0523 03/08/23 0512  BNP  --  177.7*  --   --   --   --   --   BUN 9  --  12 14 16 15 16   CREATININE 0.34*  --  0.49 0.52 0.44 0.43* 0.48  NA 139  --  138 135 137 141 138  K 4.1  --  3.3* 3.1* 3.3* 3.7 3.5  MG 2.5*  --   --  2.3 2.1  --   --    Hypokalemia Potassium level improved with replacement as above.  New onset A-fib with RVR A-fib with RVR was triggered likely because of respiratory distress  Cardiology consult was obtained.   Currently rate controlled continue Cardizem 120 mg Started on anticoagulation with Eliquis.    HLD continue pravastatin    Anxiety/depression continue Lexapro, supportive care   Mild transaminitis Likely has underlying fatty liver related to obesity Recent Labs  Lab 03/06/23 0422 03/07/23 0523 03/08/23 0512  AST 53* 55* 43*  ALT 53* 55* 53*  ALKPHOS 52 48 45  BILITOT 1.1 0.8 0.8  PROT 7.7 7.6 7.2  ALBUMIN 3.3* 3.3* 3.3*   Pressure injury of buttock stage I - POA Pressure Injury 02/24/23 Buttocks Stage 1 -  Intact skin with non-blanchable redness of a localized area usually over a bony prominence. non-blanachable area between buttocks (Active)  02/24/23 1837  Location: Buttocks  Location Orientation:   Staging: Stage 1 -  Intact skin with non-blanchable redness of a localized area usually over a bony prominence.  Wound Description (Comments): non-blanachable area between buttocks  Present on Admission: Yes     Impaired mobility  Needs a lot of encouragement to move. Seen by PT.  Home with PT ordered.  Goals of care   Code Status: DNR     DVT prophylaxis:   apixaban (ELIQUIS) tablet 5 mg   Antimicrobials: None Fluid: None Consultants: None currently Family Communication: None at bedside  Status: Inpatient Level of care:  Progressive   Patient from: Home Anticipated d/c to: Home with home health PT Needs to continue in-hospital care:  Still on significant oxygen requirement.       Diet:  Diet Order             Diet regular Room service appropriate? Yes; Fluid consistency: Thin  Diet effective now                   Scheduled Meds:  apixaban  5 mg Oral BID   diltiazem  120 mg Oral Daily   docusate sodium  100 mg Oral BID   escitalopram  20 mg Oral Daily   feeding supplement  237 mL Oral BID BM   furosemide  40 mg Oral Daily   ipratropium-albuterol  3 mL Nebulization BID   nicotine  14 mg Transdermal Daily   mouth rinse  15 mL Mouth Rinse 4 times per day   pantoprazole  40 mg Oral Daily   pneumococcal 20-valent conjugate vaccine  0.5 mL Intramuscular  Tomorrow-1000   polyethylene glycol  17 g Oral Daily   pravastatin  10 mg Oral q1800   predniSONE  20 mg Oral Q breakfast    PRN meds: sodium chloride, guaiFENesin, ipratropium-albuterol, melatonin, mouth rinse   Infusions:   sodium chloride Stopped (02/27/23 1558)    Antimicrobials: Anti-infectives (From admission, onward)    Start     Dose/Rate Route Frequency Ordered Stop   02/25/23 1130  azithromycin (ZITHROMAX) 500 mg in sodium chloride 0.9 % 250 mL IVPB  Status:  Discontinued        500 mg 250 mL/hr over 60 Minutes Intravenous Every 24 hours 02/24/23 1723 02/25/23 0652   02/25/23 1030  cefTRIAXone (ROCEPHIN) 2 g in sodium chloride 0.9 % 100 mL IVPB  Status:  Discontinued        2 g 200 mL/hr over 30 Minutes Intravenous Every 24 hours 02/24/23 1723 02/25/23 4098  02/24/23 1030  cefTRIAXone (ROCEPHIN) 1 g in sodium chloride 0.9 % 100 mL IVPB        1 g 200 mL/hr over 30 Minutes Intravenous  Once 02/24/23 1027 02/24/23 1140   02/24/23 1030  azithromycin (ZITHROMAX) 500 mg in sodium chloride 0.9 % 250 mL IVPB        500 mg 250 mL/hr over 60 Minutes Intravenous  Once 02/24/23 1027 02/24/23 1235       Nutritional status:  Body mass index is 40.86 kg/m.  Nutrition Problem: Increased nutrient needs Etiology: acute illness Signs/Symptoms: estimated needs     Objective: Vitals:   03/08/23 0740 03/08/23 1255  BP:  (!) 135/49  Pulse:    Resp:    Temp:  98.2 F (36.8 C)  SpO2: 96%     Intake/Output Summary (Last 24 hours) at 03/08/2023 1556 Last data filed at 03/08/2023 0420 Gross per 24 hour  Intake 120 ml  Output 1100 ml  Net -980 ml   Filed Weights   03/06/23 0512 03/07/23 0550 03/08/23 0500  Weight: 117.6 kg 117.4 kg 116.6 kg   Weight change: -0.826 kg Body mass index is 40.86 kg/m.   Physical Exam: General exam: Pleasant, elderly Caucasian female.  Not in distress Skin: No rashes, lesions or ulcers. HEENT: Atraumatic, normocephalic, no obvious  bleeding Lungs: Mild scattered wheezing bilaterally CVS: Regular rate and rhythm, no murmur GI/Abd soft, nontender, nondistended, bowel sound present CNS: Alert, awake, oriented x 3 Psychiatry: Sad affect Extremities: Chronic stasis changes, improved pedal edema  Data Review: I have personally reviewed the laboratory data and studies available.  F/u labs ordered Unresulted Labs (From admission, onward)     Start     Ordered   03/07/23 0500  Comprehensive metabolic panel  Daily,   R     Question:  Specimen collection method  Answer:  Lab=Lab collect   03/06/23 1143            Total time spent in review of labs and imaging, patient evaluation, formulation of plan, documentation and communication with family: 55 minutes  Signed, Lorin Glass, MD [facility] 03/08/2023

## 2023-03-08 NOTE — Progress Notes (Signed)
SATURATION QUALIFICATIONS: (This note is used to comply with regulatory documentation for home oxygen)  Patient Saturations on Room Air at Rest = 86%  Patient Saturations on Room Air while Ambulating = 58%  Patient Saturations on 5 Liters of oxygen while Ambulating = 96%  Please briefly explain why patient needs home oxygen: Pt needs O2 to stay above 88%.

## 2023-03-08 NOTE — Progress Notes (Signed)
   03/08/23 2022  BiPAP/CPAP/SIPAP  Reason BIPAP/CPAP not in use Non-compliant  BiPAP/CPAP /SiPAP Vitals  SpO2 95 %  Bilateral Breath Sounds Rhonchi

## 2023-03-09 ENCOUNTER — Inpatient Hospital Stay (HOSPITAL_COMMUNITY): Payer: 59

## 2023-03-09 DIAGNOSIS — J9602 Acute respiratory failure with hypercapnia: Secondary | ICD-10-CM | POA: Diagnosis not present

## 2023-03-09 LAB — COMPREHENSIVE METABOLIC PANEL
ALT: 45 U/L — ABNORMAL HIGH (ref 0–44)
AST: 32 U/L (ref 15–41)
Albumin: 3.2 g/dL — ABNORMAL LOW (ref 3.5–5.0)
Alkaline Phosphatase: 45 U/L (ref 38–126)
Anion gap: 7 (ref 5–15)
BUN: 15 mg/dL (ref 8–23)
CO2: 37 mmol/L — ABNORMAL HIGH (ref 22–32)
Calcium: 8.9 mg/dL (ref 8.9–10.3)
Chloride: 93 mmol/L — ABNORMAL LOW (ref 98–111)
Creatinine, Ser: 0.47 mg/dL (ref 0.44–1.00)
GFR, Estimated: >60 mL/min (ref >60)
Glucose, Bld: 160 mg/dL — ABNORMAL HIGH (ref 70–99)
Potassium: 3.5 mmol/L (ref 3.5–5.1)
Sodium: 137 mmol/L (ref 135–145)
Total Bilirubin: 0.8 mg/dL (ref 0.3–1.2)
Total Protein: 7.1 g/dL (ref 6.5–8.1)

## 2023-03-09 MED ORDER — FUROSEMIDE 10 MG/ML IJ SOLN
40.0000 mg | Freq: Two times a day (BID) | INTRAMUSCULAR | Status: DC
  Administered 2023-03-09 – 2023-03-11 (×5): 40 mg via INTRAVENOUS
  Filled 2023-03-09 (×5): qty 4

## 2023-03-09 NOTE — Progress Notes (Signed)
   03/09/23 1938  BiPAP/CPAP/SIPAP  Reason BIPAP/CPAP not in use Non-compliant   Pt stated she tried the bipap for a couple of days and "couldn't stand it."  Pt stated she is not going to wear it anymore while here in the hospital.  Bipap machine removed from room.  RN notified.

## 2023-03-09 NOTE — Progress Notes (Signed)
Physical Therapy Treatment Patient Details Name: Kimberly Love MRN: [id number] DOB: 28-Feb-1951 Today's Date: 03/09/2023   History of Present Illness 72 yo who presented to  ED 6/28 w SOB. Sounds like ongoing x 4d. Associated URI sx, cough, DOE. No fever chills. Hypoxic on EMS arrival w sats 70s. CTA chest with no PE, no pleural effusion, mild cardiomegaly, BLL bronchial wall thickening, scatred ASD RUL.PMH mood disorder HTN, GERD, tobacco use, diastolic HF (last echo 2013) s/p cataract surgery presented to [facility]    PT Comments   Patient reports having been up to ambulate and sat in recliner . Agreeable to exercises . Continue PT for exercises and mobility.       Assistance Recommended at Discharge Frequent or constant Supervision/Assistance  If plan is discharge home, recommend the following:  Can travel by private vehicle    A little help with walking and/or transfers;A little help with bathing/dressing/bathroom;Assistance with cooking/housework;Assist for transportation;Help with stairs or ramp for entrance      Equipment Recommendations  None recommended by PT    Recommendations for Other Services       Precautions / Restrictions Precautions Precautions: Fall Precaution Comments: monitor vitals     Mobility  Bed Mobility                    Transfers                        Ambulation/Gait                   Stairs             Wheelchair Mobility     Tilt Bed    Modified Rankin (Stroke Patients Only)       Balance                                            Cognition Arousal/Alertness: Awake/alert Behavior During Therapy: WFL for tasks assessed/performed                                   General Comments: willing to perform exercises, has been up in recliner and BR x 2        Exercises General Exercises - Upper Extremity Shoulder Extension: Strengthening, 5 reps, Theraband,  Supine Theraband Level (Shoulder Extension): Level 1 (Yellow) General Exercises - Lower Extremity Ankle Circles/Pumps: AROM, Both, 10 reps Quad Sets: AROM, Both, 5 reps Short Arc Quad: AROM, Strengthening, Both, 10 reps, Supine Heel Slides: AROM, Both, 5 reps, Supine Hip ABduction/ADduction: AROM, Both, 10 reps, Supine Straight Leg Raises: AROM, Strengthening, Both, 5 reps    General Comments        Pertinent Vitals/Pain Pain Assessment Pain Assessment: No/denies pain    Home Living                          Prior Function            PT Goals (current goals can now be found in the care plan section) Progress towards PT goals: Progressing toward goals    Frequency    Min 1X/week      PT Plan Current plan remains appropriate    Co-evaluation  AM-PAC PT "6 Clicks" Mobility   Outcome Measure  Help needed turning from your back to your side while in a flat bed without using bedrails?: A Little Help needed moving from lying on your back to sitting on the side of a flat bed without using bedrails?: A Little Help needed moving to and from a bed to a chair (including a wheelchair)?: A Little Help needed standing up from a chair using your arms (e.g., wheelchair or bedside chair)?: A Little Help needed to walk in hospital room?: Total Help needed climbing 3-5 steps with a railing? : Total 6 Click Score: 14    End of Session Equipment Utilized During Treatment: Oxygen Activity Tolerance: Patient limited by fatigue Patient left: in bed;with call bell/phone within reach;with bed alarm set Nurse Communication: Mobility status PT Visit Diagnosis: Difficulty in walking, not elsewhere classified (R26.2)     Time: 8841-6606 PT Time Calculation (min) (ACUTE ONLY): 16 min  Charges:    $Therapeutic Exercise: 8-22 mins PT General Charges $$ ACUTE PT VISIT: 1 Visit                     Blanchard Kelch PT Acute Rehabilitation Services Office  [phone] Weekend pager-[phone]    Rada Hay 03/09/2023, 4:02 PM

## 2023-03-09 NOTE — Progress Notes (Signed)
PROGRESS NOTE  Kimberly Love  DOB: 03/23/1951  PCP: Benita Stabile, MD ZOX:[id number]  DOA: 02/24/2023  LOS: 13 days  Hospital Day: 14  Brief narrative: Kimberly Love is a 72 y.o. female with PMH significant for morbid obesity, active smoker, HTN, diastolic CHF, CKD, GERD, anxiety/depression, generalized arthritis. 6/28, patient was brought to ED at [facility] by EMS with O2 sat low at 70s, complaint of 4 days of shortness of breath.  CT chest showed acute on chronic bronchitis as well as several small patchy airspace opacities are noted in the right upper lobe concerning for possible multifocal pneumonia. Respiratory virus panel showed parainfluenza. She required BiPAP in the ED, admitted to ICU, ultimately required intubation. 7/1, successfully extubated.  CODE STATUS changed to DNR per family. 7/3, transferred out to [facility] 7/4, cardiology was consulted as well for abnormal echo finding. See below for details  Subjective: Patient was seen and examined this morning.  Sitting up in recliner.  Requiring 4 L oxygen by nasal cannula. Has scattered bilateral crackles Chest x-ray this morning showed central vascular congestion and mild interstitial edema as well as a new left lower basilar opacity  Assessment and plan: Acute on chronic hypoxic respiratory failure Brought in for shortness of breath for 4 days, low O2 sat at 70s  Multifactorial etiology : morbid obesity, active smoking, history of CHF and possible underlying OSA.  See below for further management of individual issues Initially tried on BiPAP, ultimately required intubation, successfully extubated Currently on 4 L oxygen by nasal cannula during the day.  Required 5 to use Benadryl on ambulation yesterday.   Recommended nightly BiPAP but not compliant with.  Was not on supplemental oxygen PTA. DNR status currently  Parainfluenza virus pneumonia Respiratory panel positive for current failure.  CT chest showed  multifocal pneumonia. Was initially given broad-spectrum antibiotics in the ED but not continued because of the viral nature of the infection.  Chest x-ray this morning showed new left lower lobe basilar opacity.  Obtain procalcitonin level. Recent Labs  Lab 03/03/23 0452 03/05/23 0740 03/06/23 0422  WBC 10.7* 11.1* 10.9*   Acute on chronic bronchitis Chronic active smoker Probably has underlying undiagnosed COPD CT chest showed acute on chronic bronchitis. 7/8, started on a course of prednisone 20 mg daily. Was smoking 2 packs/day during the day of admission.  Counseled to quit smoking.   Morbid obesity  Suspected OSA Needs outpatient sleep study Body mass index is 40.73 kg/m. Patient has been advised to make an attempt to improve diet and exercise patterns to aid in weight loss.  Acute exacerbation of diastolic CHF Essential hypertension 6/29, echo read as severe asymmetric septal hypertrophy.  However cardiologist Dr. Rennis Golden reviewed the echo and suggested no evidence of severe septal hypertrophy or LVOT obstruction  Diuresis with IV Lasix.  Currently on oral Lasix.  Given the findings of interstitial edema and chest x-ray today, I will resume her on Lasix 40 mg IV twice daily. Net IO Since Admission: -6,304.62 mL [03/09/23 1344] Blood pressure in normal range currently on Cardizem, oral Lasix. Continue to monitor for daily intake output, weight, blood pressure, BNP, renal function and electrolytes. Recent Labs  Lab 03/05/23 0740 03/06/23 0422 03/07/23 0523 03/08/23 0512 03/09/23 0525  BUN 14 16 15 16 15   CREATININE 0.52 0.44 0.43* 0.48 0.47  NA 135 137 141 138 137  K 3.1* 3.3* 3.7 3.5 3.5  MG 2.3 2.1  --   --   --  Hypokalemia Potassium level improved with replacement as above.  New onset A-fib with RVR A-fib with RVR was triggered likely because of respiratory distress  Cardiology consult was obtained.   Currently rate controlled continue Cardizem 120  mg Started on anticoagulation with Eliquis.    HLD continue pravastatin   Anxiety/depression continue Lexapro, supportive care   Mild transaminitis Likely has underlying fatty liver related to obesity Recent Labs  Lab 03/06/23 0422 03/07/23 0523 03/08/23 0512 03/09/23 0525  AST 53* 55* 43* 32  ALT 53* 55* 53* 45*  ALKPHOS 52 48 45 45  BILITOT 1.1 0.8 0.8 0.8  PROT 7.7 7.6 7.2 7.1  ALBUMIN 3.3* 3.3* 3.3* 3.2*   Pressure injury of buttock stage I - POA Pressure Injury 02/24/23 Buttocks Stage 1 -  Intact skin with non-blanchable redness of a localized area usually over a bony prominence. non-blanachable area between buttocks (Active)  02/24/23 1837  Location: Buttocks  Location Orientation:   Staging: Stage 1 -  Intact skin with non-blanchable redness of a localized area usually over a bony prominence.  Wound Description (Comments): non-blanachable area between buttocks  Present on Admission: Yes     Impaired mobility  Needs a lot of encouragement to move. Seen by PT.  Home with PT ordered.  Goals of care   Code Status: DNR     DVT prophylaxis:   apixaban (ELIQUIS) tablet 5 mg   Antimicrobials: None Fluid: None Consultants: None currently Family Communication: None at bedside  Status: Inpatient Level of care:  Progressive   Patient from: Home Anticipated d/c to: Home with home health PT Needs to continue in-hospital care:  Still on significant oxygen requirement.  Requiring IV Lasix       Diet:  Diet Order             Diet regular Room service appropriate? Yes; Fluid consistency: Thin  Diet effective now                   Scheduled Meds:  apixaban  5 mg Oral BID   diltiazem  120 mg Oral Daily   docusate sodium  100 mg Oral BID   escitalopram  20 mg Oral Daily   feeding supplement  237 mL Oral BID BM   furosemide  40 mg Intravenous Q12H   ipratropium-albuterol  3 mL Nebulization BID   nicotine  14 mg Transdermal Daily   mouth rinse  15  mL Mouth Rinse 4 times per day   pantoprazole  40 mg Oral Daily   pneumococcal 20-valent conjugate vaccine  0.5 mL Intramuscular Tomorrow-1000   polyethylene glycol  17 g Oral Daily   pravastatin  10 mg Oral q1800   predniSONE  20 mg Oral Q breakfast    PRN meds: sodium chloride, guaiFENesin, ipratropium-albuterol, melatonin, mouth rinse   Infusions:   sodium chloride Stopped (02/27/23 1558)    Antimicrobials: Anti-infectives (From admission, onward)    Start     Dose/Rate Route Frequency Ordered Stop   02/25/23 1130  azithromycin (ZITHROMAX) 500 mg in sodium chloride 0.9 % 250 mL IVPB  Status:  Discontinued        500 mg 250 mL/hr over 60 Minutes Intravenous Every 24 hours 02/24/23 1723 02/25/23 0652   02/25/23 1030  cefTRIAXone (ROCEPHIN) 2 g in sodium chloride 0.9 % 100 mL IVPB  Status:  Discontinued        2 g 200 mL/hr over 30 Minutes Intravenous Every 24 hours 02/24/23 1723 02/25/23 1610  02/24/23 1030  cefTRIAXone (ROCEPHIN) 1 g in sodium chloride 0.9 % 100 mL IVPB        1 g 200 mL/hr over 30 Minutes Intravenous  Once 02/24/23 1027 02/24/23 1140   02/24/23 1030  azithromycin (ZITHROMAX) 500 mg in sodium chloride 0.9 % 250 mL IVPB        500 mg 250 mL/hr over 60 Minutes Intravenous  Once 02/24/23 1027 02/24/23 1235       Nutritional status:  Body mass index is 40.73 kg/m.  Nutrition Problem: Increased nutrient needs Etiology: acute illness Signs/Symptoms: estimated needs     Objective: Vitals:   03/09/23 0838 03/09/23 1232  BP:  110/67  Pulse:    Resp:    Temp:  98.1 F (36.7 C)  SpO2: 94%     Intake/Output Summary (Last 24 hours) at 03/09/2023 1344 Last data filed at 03/09/2023 0437 Gross per 24 hour  Intake 240 ml  Output 300 ml  Net -60 ml   Filed Weights   03/07/23 0550 03/08/23 0500 03/09/23 0500  Weight: 117.4 kg 116.6 kg 116.2 kg   Weight change: -0.363 kg Body mass index is 40.73 kg/m.   Physical Exam: General exam: Pleasant,  elderly Caucasian female.  Not in distress Skin: No rashes, lesions or ulcers. HEENT: Atraumatic, normocephalic, no obvious bleeding Lungs: Mild scattered crackles, requiring 5 L O2 CVS: Regular rate and rhythm, no murmur GI/Abd soft, nontender, nondistended, bowel sound present CNS: Alert, awake, oriented x 3 Psychiatry: Sad affect Extremities: Chronic stasis changes, improved pedal edema  Data Review: I have personally reviewed the laboratory data and studies available.  F/u labs ordered Unresulted Labs (From admission, onward)     Start     Ordered   03/10/23 0500  CBC with Differential/Platelet  Tomorrow morning,   R       Question:  Specimen collection method  Answer:  Lab=Lab collect   03/09/23 1342   03/10/23 0500  Procalcitonin  Tomorrow morning,   R       References:    Procalcitonin Lower Respiratory Tract Infection AND Sepsis Procalcitonin Algorithm  Question:  Specimen collection method  Answer:  Lab=Lab collect   03/09/23 1342   03/07/23 0500  Comprehensive metabolic panel  Daily,   R     Question:  Specimen collection method  Answer:  Lab=Lab collect   03/06/23 1143            Total time spent in review of labs and imaging, patient evaluation, formulation of plan, documentation and communication with family: 45 minutes  Signed, Lorin Glass, MD [facility] 03/09/2023

## 2023-03-10 DIAGNOSIS — J9602 Acute respiratory failure with hypercapnia: Secondary | ICD-10-CM | POA: Diagnosis not present

## 2023-03-10 LAB — COMPREHENSIVE METABOLIC PANEL
ALT: 48 U/L — ABNORMAL HIGH (ref 0–44)
AST: 37 U/L (ref 15–41)
Albumin: 3.5 g/dL (ref 3.5–5.0)
Alkaline Phosphatase: 47 U/L (ref 38–126)
Anion gap: 10 (ref 5–15)
BUN: 16 mg/dL (ref 8–23)
CO2: 36 mmol/L — ABNORMAL HIGH (ref 22–32)
Calcium: 8.7 mg/dL — ABNORMAL LOW (ref 8.9–10.3)
Chloride: 90 mmol/L — ABNORMAL LOW (ref 98–111)
Creatinine, Ser: 0.42 mg/dL — ABNORMAL LOW (ref 0.44–1.00)
GFR, Estimated: >60 mL/min (ref >60)
Glucose, Bld: 155 mg/dL — ABNORMAL HIGH (ref 70–99)
Potassium: 3.4 mmol/L — ABNORMAL LOW (ref 3.5–5.1)
Sodium: 136 mmol/L (ref 135–145)
Total Bilirubin: 1 mg/dL (ref 0.3–1.2)
Total Protein: 7.6 g/dL (ref 6.5–8.1)

## 2023-03-10 LAB — CBC WITH DIFFERENTIAL/PLATELET
Abs Immature Granulocytes: 0.09 10*3/uL — ABNORMAL HIGH (ref 0.00–0.07)
Basophils Absolute: 0 10*3/uL (ref 0.0–0.1)
Basophils Relative: 0 %
Eosinophils Absolute: 0.1 10*3/uL (ref 0.0–0.5)
Eosinophils Relative: 1 %
HCT: 38.7 % (ref 36.0–46.0)
Hemoglobin: 11.8 g/dL — ABNORMAL LOW (ref 12.0–15.0)
Immature Granulocytes: 1 %
Lymphocytes Relative: 16 %
Lymphs Abs: 2.4 10*3/uL (ref 0.7–4.0)
MCH: 29 pg (ref 26.0–34.0)
MCHC: 30.5 g/dL (ref 30.0–36.0)
MCV: 95.1 fL (ref 80.0–100.0)
Monocytes Absolute: 1 10*3/uL (ref 0.1–1.0)
Monocytes Relative: 7 %
Neutro Abs: 11.5 10*3/uL — ABNORMAL HIGH (ref 1.7–7.7)
Neutrophils Relative %: 75 %
Platelets: 202 10*3/uL (ref 150–400)
RBC: 4.07 MIL/uL (ref 3.87–5.11)
RDW: 15.3 % (ref 11.5–15.5)
WBC: 15.1 10*3/uL — ABNORMAL HIGH (ref 4.0–10.5)
nRBC: 0 % (ref 0.0–0.2)

## 2023-03-10 LAB — PROCALCITONIN: Procalcitonin: <0.1 ng/mL

## 2023-03-10 NOTE — Progress Notes (Signed)
Physical Therapy Treatment Patient Details Name: Kimberly Love MRN: [id number] DOB: [birth year] Today's Date: 03/10/2023   History of Present Illness 72 yo who presented to  ED 6/28 w SOB. Sounds like ongoing x 4d. Associated URI sx, cough, DOE. No fever chills. Hypoxic on EMS arrival w sats 70s. CTA chest with no PE, no pleural effusion, mild cardiomegaly, BLL bronchial wall thickening, scatred ASD RUL.PMH mood disorder HTN, GERD, tobacco use, diastolic HF (last echo 2013) s/p cataract surgery presented to [facility]    PT Comments  Patient reports feeling well, initially felt  less wheeze but after mobility, sauration testing, patent reports feeling more wheezing.  Patient hopeful to Dc home soon. See saturation  test separate note. Patient will benefit from HHPT.     Assistance Recommended at Discharge Intermittent Supervision/Assistance  If plan is discharge home, recommend the following:  Can travel by private vehicle    A little help with walking and/or transfers;A little help with bathing/dressing/bathroom;Assistance with cooking/housework;Assist for transportation;Help with stairs or ramp for entrance      Equipment Recommendations  None recommended by PT    Recommendations for Other Services       Precautions / Restrictions Precautions Precautions: Fall Precaution Comments: monitor vitals, on 3-4 LPM Restrictions Weight Bearing Restrictions: No     Mobility  Bed Mobility               General bed mobility comments: in recliner    Transfers Overall transfer level: Needs assistance Equipment used: Rolling walker (2 wheels) Transfers: Sit to/from Stand Sit to Stand: Supervision                Ambulation/Gait               General Gait Details: declined ambulation bit did  participate in sat walk test, standing and stepping in place  x 10 steps x 2.   Stairs             Wheelchair Mobility     Tilt Bed    Modified Rankin (Stroke  Patients Only)       Balance Overall balance assessment: No apparent balance deficits (not formally assessed)                                          Cognition Arousal/Alertness: Awake/alert Behavior During Therapy: WFL for tasks assessed/performed Overall Cognitive Status: Within Functional Limits for tasks assessed                                          Exercises      General Comments        Pertinent Vitals/Pain Pain Assessment Pain Assessment: No/denies pain    Home Living                          Prior Function            PT Goals (current goals can now be found in the care plan section) Progress towards PT goals: Progressing toward goals    Frequency    Min 1X/week      PT Plan Current plan remains appropriate    Co-evaluation              AM-PAC  PT "6 Clicks" Mobility   Outcome Measure  Help needed turning from your back to your side while in a flat bed without using bedrails?: A Little Help needed moving from lying on your back to sitting on the side of a flat bed without using bedrails?: A Little Help needed moving to and from a bed to a chair (including a wheelchair)?: A Little Help needed standing up from a chair using your arms (e.g., wheelchair or bedside chair)?: A Little Help needed to walk in hospital room?: A Lot Help needed climbing 3-5 steps with a railing? : A Lot 6 Click Score: 16    End of Session Equipment Utilized During Treatment: Oxygen Activity Tolerance: Patient tolerated treatment well Patient left: in chair;with call bell/phone within reach Nurse Communication: Mobility status PT Visit Diagnosis: Difficulty in walking, not elsewhere classified (R26.2)     Time: 4098-1191 PT Time Calculation (min) (ACUTE ONLY): 42 min  Charges:    $Gait Training: 23-37 mins $Self Care/Home Management: 8-22 PT General Charges $$ ACUTE PT VISIT: 1 Visit                       Blanchard Kelch PT Acute Rehabilitation Services Office [phone] Weekend [phone]    Rada Hay 03/10/2023, 9:46 AM

## 2023-03-10 NOTE — Plan of Care (Signed)

## 2023-03-10 NOTE — Progress Notes (Addendum)
SATURATION QUALIFICATIONS: (This note is used to comply with regulatory documentation for home oxygen)  Patient Saturations on Room Air at Rest = 87%  Patient Saturations on Room Air while Ambulating = 77%  Patient Saturations on 4 Liters of oxygen while Ambulating = 88%  Please briefly explain why patient needs home oxygen:Patient requires supplemental oxygen to maintain saturation >88% during activities such as ambulation. Blanchard Kelch PT Acute Rehabilitation Services Office [phone] Weekend pager-[phone]

## 2023-03-10 NOTE — Plan of Care (Signed)
  Problem: Education: Goal: Knowledge of General Education information will improve Description: Including pain rating scale, medication(s)/side effects and non-pharmacologic comfort measures 03/10/2023 1913 by Antony Odea, RN Outcome: Progressing 03/10/2023 1913 by Antony Odea, RN Outcome: Progressing   Problem: Health Behavior/Discharge Planning: Goal: Ability to manage health-related needs will improve 03/10/2023 1913 by Antony Odea, RN Outcome: Progressing 03/10/2023 1913 by Antony Odea, RN Outcome: Progressing   Problem: Clinical Measurements: Goal: Ability to maintain clinical measurements within normal limits will improve 03/10/2023 1913 by Antony Odea, RN Outcome: Progressing 03/10/2023 1913 by Antony Odea, RN Outcome: Progressing

## 2023-03-10 NOTE — Plan of Care (Signed)
  Problem: Education: Goal: Knowledge of General Education information will improve Description: Including pain rating scale, medication(s)/side effects and non-pharmacologic comfort measures Outcome: Progressing   Problem: Clinical Measurements: Goal: Respiratory complications will improve Outcome: Progressing   Problem: Coping: Goal: Level of anxiety will decrease Outcome: Progressing   Problem: Pain Managment: Goal: General experience of comfort will improve Outcome: Progressing   Problem: Activity: Goal: Risk for activity intolerance will decrease Outcome: Not Progressing   Problem: Activity: Goal: Ability to tolerate increased activity will improve Outcome: Not Progressing

## 2023-03-10 NOTE — Progress Notes (Signed)
PROGRESS NOTE  Kimberly Love  DOB: [birth year]  PCP: Benita Stabile, MD WGN:[id number]  DOA: 02/24/2023  LOS: 14 days  Hospital Day: 15  Brief narrative: Kimberly Love is a 72 y.o. female with PMH significant for morbid obesity, active smoker, HTN, diastolic CHF, CKD, GERD, anxiety/depression, generalized arthritis. 6/28, patient was brought to ED at [facility] by EMS with O2 sat low at 70s, complaint of 4 days of shortness of breath.  CT chest showed acute on chronic bronchitis as well as several small patchy airspace opacities are noted in the right upper lobe concerning for possible multifocal pneumonia. Respiratory virus panel showed parainfluenza. She required BiPAP in the ED, admitted to ICU, ultimately required intubation. 7/1, successfully extubated.  CODE STATUS changed to DNR per family. 7/3, transferred out to [facility] 7/4, cardiology was consulted as well for abnormal echo finding. See below for details  Subjective: Patient was seen and examined this morning.  Sitting up in recliner.  Still on 3 to 4 L of oxygen by nasal cannula.  Required up to 5 L on ambulation.  Has bibasilar crackles but better than yesterday.  Assessment and plan: Acute on chronic hypoxic respiratory failure Brought in for shortness of breath for 4 days, low O2 sat at 70s  Multifactorial etiology : morbid obesity, active smoking, history of CHF and possible underlying OSA.  See below for further management of individual issues Initially tried on BiPAP, ultimately required intubation, successfully extubated Currently on 4 L oxygen by nasal cannula during the day.  Required 5 L oxygen ambulation this morning.   Recommended nightly BiPAP but not compliant with.  Was not on supplemental oxygen PTA. DNR status currently  Parainfluenza virus pneumonia On admission, respiratory panel was positive for parainfluenza.  CT chest showed multifocal pneumonia. Was initially given broad-spectrum antibiotics  in the ED but not continued because of the viral nature of the infection.   7/11, chest x-ray this morning showed new left lower lobe basilar opacity.  Pro-Cal level remains negative. Off antibiotics. Recent Labs  Lab 03/05/23 0740 03/06/23 0422 03/10/23 0431  WBC 11.1* 10.9* 15.1*  PROCALCITON  --   --  <0.10   Acute on chronic bronchitis Chronic active smoker Probably has underlying undiagnosed COPD CT chest showed acute on chronic bronchitis. 7/8, started on a course of prednisone 20 mg daily.  Continue Was smoking 2 packs/day during the day of admission.  Counseled to quit smoking.   Morbid obesity  Suspected OSA Needs outpatient sleep study Body mass index is 40.73 kg/m. Patient has been advised to make an attempt to improve diet and exercise patterns to aid in weight loss.  Acute exacerbation of diastolic CHF Essential hypertension 6/29, echo read as severe asymmetric septal hypertrophy.  However cardiologist Dr. Rennis Golden reviewed the echo and suggested no evidence of severe septal hypertrophy or LVOT obstruction  Diuresis with IV Lasix.  Currently on oral Lasix.  Given the findings of interstitial edema and chest x-ray on 7/11, she was resumed on IV Lasix 40 mg twice daily.  I will continue the same for today. Net IO Since Admission: -6,724.62 mL [03/10/23 1501] Blood pressure in normal range currently on Cardizem, oral Lasix. Continue to monitor for daily intake output, weight, blood pressure, BNP, renal function and electrolytes. Recent Labs  Lab 03/05/23 0740 03/06/23 0422 03/07/23 0523 03/08/23 0512 03/09/23 0525 03/10/23 0431  BUN 14 16 15 16 15 16   CREATININE 0.52 0.44 0.43* 0.48 0.47 0.42*  NA 135  137 141 138 137 136  K 3.1* 3.3* 3.7 3.5 3.5 3.4*  MG 2.3 2.1  --   --   --   --    Hypokalemia Potassium level improved with replacement as above.  New onset A-fib with RVR A-fib with RVR was triggered likely because of respiratory distress  Cardiology consult  was obtained.   Currently rate controlled continue Cardizem 120 mg Started on anticoagulation with Eliquis.    HLD continue pravastatin   Anxiety/depression continue Lexapro, supportive care   Mild transaminitis Likely has underlying fatty liver related to obesity Recent Labs  Lab 03/06/23 0422 03/07/23 0523 03/08/23 0512 03/09/23 0525 03/10/23 0431  AST 53* 55* 43* 32 37  ALT 53* 55* 53* 45* 48*  ALKPHOS 52 48 45 45 47  BILITOT 1.1 0.8 0.8 0.8 1.0  PROT 7.7 7.6 7.2 7.1 7.6  ALBUMIN 3.3* 3.3* 3.3* 3.2* 3.5   Pressure injury of buttock stage I - POA Pressure Injury 02/24/23 Buttocks Stage 1 -  Intact skin with non-blanchable redness of a localized area usually over a bony prominence. non-blanachable area between buttocks (Active)  02/24/23 1837  Location: Buttocks  Location Orientation:   Staging: Stage 1 -  Intact skin with non-blanchable redness of a localized area usually over a bony prominence.  Wound Description (Comments): non-blanachable area between buttocks  Present on Admission: Yes     Impaired mobility  Needs a lot of encouragement to move. Seen by PT.  Home with PT ordered.  Goals of care   Code Status: DNR     DVT prophylaxis:   apixaban (ELIQUIS) tablet 5 mg   Antimicrobials: None Fluid: None Consultants: None currently Family Communication: None at bedside  Status: Inpatient Level of care:  Progressive   Patient from: Home Anticipated d/c to: Home with home health PT, hopefully in 1 to 2 days Needs to continue in-hospital care:  Still on significant oxygen requirement.  Requiring IV Lasix       Diet:  Diet Order             Diet regular Room service appropriate? Yes; Fluid consistency: Thin  Diet effective now                   Scheduled Meds:  apixaban  5 mg Oral BID   diltiazem  120 mg Oral Daily   docusate sodium  100 mg Oral BID   escitalopram  20 mg Oral Daily   feeding supplement  237 mL Oral BID BM   furosemide   40 mg Intravenous Q12H   ipratropium-albuterol  3 mL Nebulization BID   nicotine  14 mg Transdermal Daily   mouth rinse  15 mL Mouth Rinse 4 times per day   pantoprazole  40 mg Oral Daily   pneumococcal 20-valent conjugate vaccine  0.5 mL Intramuscular Tomorrow-1000   polyethylene glycol  17 g Oral Daily   pravastatin  10 mg Oral q1800   predniSONE  20 mg Oral Q breakfast    PRN meds: sodium chloride, guaiFENesin, ipratropium-albuterol, melatonin, mouth rinse   Infusions:   sodium chloride Stopped (02/27/23 1558)    Antimicrobials: Anti-infectives (From admission, onward)    Start     Dose/Rate Route Frequency Ordered Stop   02/25/23 1130  azithromycin (ZITHROMAX) 500 mg in sodium chloride 0.9 % 250 mL IVPB  Status:  Discontinued        500 mg 250 mL/hr over 60 Minutes Intravenous Every 24 hours 02/24/23 1723  02/25/23 0652   02/25/23 1030  cefTRIAXone (ROCEPHIN) 2 g in sodium chloride 0.9 % 100 mL IVPB  Status:  Discontinued        2 g 200 mL/hr over 30 Minutes Intravenous Every 24 hours 02/24/23 1723 02/25/23 0652   02/24/23 1030  cefTRIAXone (ROCEPHIN) 1 g in sodium chloride 0.9 % 100 mL IVPB        1 g 200 mL/hr over 30 Minutes Intravenous  Once 02/24/23 1027 02/24/23 1140   02/24/23 1030  azithromycin (ZITHROMAX) 500 mg in sodium chloride 0.9 % 250 mL IVPB        500 mg 250 mL/hr over 60 Minutes Intravenous  Once 02/24/23 1027 02/24/23 1235       Nutritional status:  Body mass index is 40.73 kg/m.  Nutrition Problem: Increased nutrient needs Etiology: acute illness Signs/Symptoms: estimated needs     Objective: Vitals:   03/10/23 0959 03/10/23 1215  BP: (!) 107/52 (!) 109/56  Pulse:  87  Resp:  14  Temp:  98.7 F (37.1 C)  SpO2:  94%    Intake/Output Summary (Last 24 hours) at 03/10/2023 1501 Last data filed at 03/10/2023 1217 Gross per 24 hour  Intake 600 ml  Output 1500 ml  Net -900 ml   Filed Weights   03/07/23 0550 03/08/23 0500 03/09/23 0500   Weight: 117.4 kg 116.6 kg 116.2 kg   Weight change:  Body mass index is 40.73 kg/m.   Physical Exam: General exam: Pleasant, elderly Caucasian female.  Not in distress Skin: No rashes, lesions or ulcers. HEENT: Atraumatic, normocephalic, no obvious bleeding Lungs: Continues to have bilateral basilar crackles, requires 5 L documented on ambulation. CVS: Regular rate and rhythm, no murmur GI/Abd soft, nontender, nondistended, bowel sound present CNS: Alert, awake, oriented x 3 Psychiatry: Sad affect Extremities: Chronic stasis changes, improved pedal edema  Data Review: I have personally reviewed the laboratory data and studies available.  F/u labs ordered Unresulted Labs (From admission, onward)     Start     Ordered   03/07/23 0500  Comprehensive metabolic panel  Daily,   R     Question:  Specimen collection method  Answer:  Lab=Lab collect   03/06/23 1143            Total time spent in review of labs and imaging, patient evaluation, formulation of plan, documentation and communication with family: 45 minutes  Signed, Lorin Glass, MD [facility] 03/10/2023

## 2023-03-10 NOTE — TOC Progression Note (Signed)
Transition of Care [facility]) - Progression Note    Patient Details  Name: Kimberly Love MRN: [id number] Date of Birth: Apr 08, 1951  Transition of Care [facility]) CM/SW Contact  Adrian Prows, RN Phone Number: 03/10/2023, 10:50 AM  Clinical Narrative:    [facility] consult for HHPT and home oxygen; spoke w/ pt in room; she agrees to recc services; she does not have an agency preference; pt confirmed her d/c address and POC: [address] [place], [place] [address], Kimberly Love (spouse) [phone]; spoke w/ [name] at [facility] and she says agency can provide services; contacted Jermaine at [facility]; he says agency can provide services and travel tank will be delivered to pt's room; Tpt notified, and contact info for agencies placed in follow up provider section of d/c instructions; TOC will con't to follow.   Expected Discharge Plan: Home w Home Health Services Barriers to Discharge: Continued Medical Work up  Expected Discharge Plan and Services   Discharge Planning Services: CM Consult   Living arrangements for the past 2 months: Single Family Home                                       Social Determinants of Health (SDOH) Interventions SDOH Screenings   Food Insecurity: No Food Insecurity (02/24/2023)  Housing: Low Risk  (02/24/2023)  Transportation Needs: No Transportation Needs (02/24/2023)  Utilities: Not At Risk (02/24/2023)  Tobacco Use: High Risk (02/24/2023)    Readmission Risk Interventions    03/01/2023    1:36 PM  Readmission Risk Prevention Plan  Post Dischage Appt Complete  Medication Screening Complete  Transportation Screening Complete

## 2023-03-11 DIAGNOSIS — J9602 Acute respiratory failure with hypercapnia: Secondary | ICD-10-CM | POA: Diagnosis not present

## 2023-03-11 LAB — COMPREHENSIVE METABOLIC PANEL
ALT: 48 U/L — ABNORMAL HIGH (ref 0–44)
AST: 32 U/L (ref 15–41)
Albumin: 3.5 g/dL (ref 3.5–5.0)
Alkaline Phosphatase: 48 U/L (ref 38–126)
Anion gap: 11 (ref 5–15)
BUN: 16 mg/dL (ref 8–23)
CO2: 36 mmol/L — ABNORMAL HIGH (ref 22–32)
Calcium: 8.5 mg/dL — ABNORMAL LOW (ref 8.9–10.3)
Chloride: 90 mmol/L — ABNORMAL LOW (ref 98–111)
Creatinine, Ser: 0.49 mg/dL (ref 0.44–1.00)
GFR, Estimated: >60 mL/min (ref >60)
Glucose, Bld: 107 mg/dL — ABNORMAL HIGH (ref 70–99)
Potassium: 3.2 mmol/L — ABNORMAL LOW (ref 3.5–5.1)
Sodium: 137 mmol/L (ref 135–145)
Total Bilirubin: 0.8 mg/dL (ref 0.3–1.2)
Total Protein: 7.3 g/dL (ref 6.5–8.1)

## 2023-03-11 MED ORDER — FUROSEMIDE 40 MG PO TABS: ORAL_TABLET | ORAL | 0 refills | Status: AC

## 2023-03-11 MED ORDER — POTASSIUM CHLORIDE CRYS ER 20 MEQ PO TBCR
40.0000 meq | EXTENDED_RELEASE_TABLET | Freq: Once | ORAL | Status: AC
  Administered 2023-03-11: 40 meq via ORAL
  Filled 2023-03-11: qty 2

## 2023-03-11 MED ORDER — DILTIAZEM HCL ER COATED BEADS 120 MG PO CP24: 120.0000 mg | ORAL_CAPSULE | Freq: Every day | ORAL | 0 refills | Status: DC

## 2023-03-11 MED ORDER — APIXABAN 5 MG PO TABS: 5.0000 mg | ORAL_TABLET | Freq: Two times a day (BID) | ORAL | 0 refills | Status: AC

## 2023-03-11 NOTE — Discharge Summary (Signed)
Physician Discharge Summary  Kimberly Love:[id number] DOB: 1951/06/16 DOA: 02/24/2023  PCP: Benita Stabile, MD  Admit date: 02/24/2023 Discharge date: 03/11/2023  Admitted From: Home Discharge disposition: Home with home LBD  Recommendations at discharge:  You have been started on new medicines including Cardizem, Lasix, Eliquis and potassium supplements. Lisinopril/HCTZ has been stopped Can take extra dose of Lasix if you have enlarging bilateral leg swelling, difficulty breathing or weight gain. You have been started on blood thinner Eliquis.  Watch out for any bleeding or excessive bruisability Stop smoking  Brief narrative: Kimberly Love is a 72 y.o. female with PMH significant for morbid obesity, active smoker, HTN, diastolic CHF, CKD, GERD, anxiety/depression, generalized arthritis. 6/28, patient was brought to ED at [facility] by EMS with O2 sat low at 70s, complaint of 4 days of shortness of breath.  CT chest showed acute on chronic bronchitis as well as several small patchy airspace opacities are noted in the right upper lobe concerning for possible multifocal pneumonia. Respiratory virus panel showed parainfluenza. She required BiPAP in the ED, admitted to ICU, ultimately required intubation. 7/1, successfully extubated.  CODE STATUS changed to DNR per family. 7/3, transferred out to [facility] 7/4, cardiology was consulted as well for abnormal echo finding. See below for details  Subjective: Patient was seen and examined this morning.  Lying on bed.  On 3 L oxygen by nasal cannula.  Feels better.  Husband at bedside.  Patient is excited to go home today.  Assessment and plan: Acute on chronic hypoxic respiratory failure Brought in for shortness of breath for 4 days, low O2 sat at 70s  Multifactorial etiology : morbid obesity, active smoking, history of CHF and possible underlying OSA.  See below for further management of individual issues Initially tried on  BiPAP, ultimately required intubation, successfully extubated Currently on 3 L oxygen by nasal cannula during the day.  Required 5 L oxygen ambulation yesterday morning. Recommended nightly BiPAP but not compliant with.  Home oxygen has been arranged.  Continue weaning down for at home. DNR status currently  Parainfluenza virus pneumonia On admission, respiratory panel was positive for parainfluenza.  CT chest showed multifocal pneumonia. Was initially given broad-spectrum antibiotics in the ED but not continued because of the viral nature of the infection.   7/11, chest x-ray this morning showed new left lower lobe basilar opacity.  Pro-Cal level remains negative. Off antibiotics. Recent Labs  Lab 03/05/23 0740 03/06/23 0422 03/10/23 0431  WBC 11.1* 10.9* 15.1*  PROCALCITON  --   --  <0.10   Acute on chronic bronchitis Chronic active smoker Probably has underlying undiagnosed COPD CT chest showed acute on chronic bronchitis. 7/8, completed course of prednisone. Was smoking 2 packs/day during the day of admission.  Counseled to quit smoking.   Morbid obesity  Suspected OSA Needs outpatient sleep study Body mass index is 40.59 kg/m. Patient has been advised to make an attempt to improve diet and exercise patterns to aid in weight loss.  Acute exacerbation of diastolic CHF Essential hypertension 6/29, echo read as severe asymmetric septal hypertrophy.  However cardiologist Dr. Rennis Golden reviewed the echo and suggested no evidence of severe septal hypertrophy or LVOT obstruction  She was diuresed with IV Lasix, about 10 L of negative balance.   Volume status improving.  Blood pressure stable.  Plan to discharge on Lasix oral 40 mg daily and as needed.  Hypokalemia Potassium level low at present diuresis.  Replacement given.  Since  she is getting discharged on Lasix 40 mg daily, I would also add Lasix 20 mg daily to her regimen. Recent Labs  Lab 03/05/23 0740 03/06/23 0422  03/07/23 0523 03/08/23 0512 03/09/23 0525 03/10/23 0431 03/11/23 0457  K 3.1* 3.3* 3.7 3.5 3.5 3.4* 3.2*  MG 2.3 2.1  --   --   --   --   --    New onset A-fib with RVR A-fib with RVR was triggered likely because of respiratory distress  Cardiology consult was obtained.   Currently rate controlled continue Cardizem 120 mg Started on anticoagulation with Eliquis.    HLD continue pravastatin   Anxiety/depression continue Lexapro, supportive care   Mild transaminitis Likely has underlying fatty liver related to obesity Recent Labs  Lab 03/07/23 0523 03/08/23 0512 03/09/23 0525 03/10/23 0431 03/11/23 0457  AST 55* 43* 32 37 32  ALT 55* 53* 45* 48* 48*  ALKPHOS 48 45 45 47 48  BILITOT 0.8 0.8 0.8 1.0 0.8  PROT 7.6 7.2 7.1 7.6 7.3  ALBUMIN 3.3* 3.3* 3.2* 3.5 3.5   Pressure injury of buttock stage I - POA Pressure Injury 02/24/23 Buttocks Stage 1 -  Intact skin with non-blanchable redness of a localized area usually over a bony prominence. non-blanachable area between buttocks (Active)  02/24/23 1837  Location: Buttocks  Location Orientation:   Staging: Stage 1 -  Intact skin with non-blanchable redness of a localized area usually over a bony prominence.  Wound Description (Comments): non-blanachable area between buttocks  Present on Admission: Yes     Impaired mobility  Needs a lot of encouragement to move. Seen by PT.  Home with PT ordered.  Goals of care   Code Status: DNR   Wounds:  - Pressure Injury 02/24/23 Buttocks Stage 1 -  Intact skin with non-blanchable redness of a localized area usually over a bony prominence. non-blanachable area between buttocks (Active)  Date First Assessed/Time First Assessed: 02/24/23 1837   Location: Buttocks  Staging: Stage 1 -  Intact skin with non-blanchable redness of a localized area usually over a bony prominence.  Wound Description (Comments): non-blanachable area between bu...    Assessments 02/24/2023  5:06 PM 03/11/2023   1:23 AM  Dressing Type Foam - Lift dressing to assess site every shift None  Site / Wound Assessment Clean;Dry --  Peri-wound Assessment Intact --  Drainage Amount None --     No associated orders.    Discharge Exam:   Vitals:   03/10/23 2136 03/11/23 0500 03/11/23 0505 03/11/23 0843  BP: (!) 112/52  (!) 118/47   Pulse:      Resp:      Temp: 98.7 F (37.1 C)  98 F (36.7 C)   TempSrc: Oral  Oral   SpO2:    92%  Weight:  115.8 kg    Height:        Body mass index is 40.59 kg/m.  General exam: Pleasant, elderly Caucasian female.  Not in distress Skin: No rashes, lesions or ulcers. HEENT: Atraumatic, normocephalic, no obvious bleeding Lungs: Diminished air entry in both bases.  Otherwise clear to auscultation bilaterally cough improving. CVS: Regular rate and rhythm, no murmur GI/Abd soft, nontender, nondistended, bowel sound present CNS: Alert, awake, oriented x 3 Psychiatry: Mood appropriate. Extremities: Chronic stasis changes, improved pedal edema  Follow ups:    Follow-up Information     [facility] Follow up.   Specialty: Home Health Services Why: [facility]  Home Health Physical Therapy Contact information: [address] [place] [address] [phone]         Marinus Maw, MD Follow up.   Specialty: Cardiology Why: office scheduler will contact you to arrange follow up, please give [place] a call if you do not hear from our scheduler in 3 bussiness days. Contact information: [address] [place] [address] [phone]         [facility] Follow up.   Contact information: [address] [place], [place] [address] [phone]        Benita Stabile, MD Follow up.   Specialty: Internal Medicine Contact information[place] [address]                 Discharge Instructions:   Discharge Instructions     Call MD for:  difficulty breathing, headache or  visual disturbances   Complete by: As directed    Call MD for:  extreme fatigue   Complete by: As directed    Call MD for:  hives   Complete by: As directed    Call MD for:  persistant dizziness or light-headedness   Complete by: As directed    Call MD for:  persistant nausea and vomiting   Complete by: As directed    Call MD for:  severe uncontrolled pain   Complete by: As directed    Call MD for:  temperature >100.4   Complete by: As directed    Diet - low sodium heart healthy   Complete by: As directed    Discharge instructions   Complete by: As directed    Recommendations at discharge:   You have been started on new medicines including Cardizem, Lasix, Eliquis and potassium supplements.  Lisinopril/HCTZ has been stopped  Can take extra dose of Lasix if you have enlarging bilateral leg swelling, difficulty breathing or weight gain.  You have been started on blood thinner Eliquis.  Watch out for any bleeding or excessive bruisability  Stop smoking  Discharge instructions for CHF Check weight daily -preferably same time every day. Restrict fluid intake to 1200 ml daily Restrict salt intake to less than 2 g daily. Call MD if you have one of the following symptoms 1) 3 pound weight gain in 24 hours or 5 pounds in 1 week  2) swelling in the hands, feet or stomach  3) progressive shortness of breath 4) if you have to sleep on extra pillows at night in order to breathe     General discharge instructions: Follow with Primary MD Benita Stabile, MD in 7 days  Please request your PCP  to go over your hospital tests, procedures, radiology results at the follow up. Please get your medicines reviewed and adjusted.  Your PCP may decide to repeat certain labs or tests as needed. Do not drive, operate heavy machinery, perform activities at heights, swimming or participation in water activities or provide baby sitting services if your were admitted for syncope or siezures until you have  seen by Primary MD or a Neurologist and advised to do so again. [place] Controlled Substance Reporting System database was reviewed. Do not drive, operate heavy machinery, perform activities at heights, swim, participate in water activities or provide baby-sitting services while on medications for pain, sleep and mood until your outpatient physician has reevaluated you and advised to do so again.  You are strongly recommended to comply with the dose, frequency and duration of prescribed  medications. Activity: As tolerated with Full fall precautions use walker/cane & assistance as needed Avoid using any recreational substances like cigarette, tobacco, alcohol, or non-prescribed drug. If you experience worsening of your admission symptoms, develop shortness of breath, life threatening emergency, suicidal or homicidal thoughts you must seek medical attention immediately by calling 911 or calling your MD immediately  if symptoms less severe. You must read complete instructions/literature along with all the possible adverse reactions/side effects for all the medicines you take and that have been prescribed to you. Take any new medicine only after you have completely understood and accepted all the possible adverse reactions/side effects.  Wear Seat belts while driving. You were cared for by a hospitalist during your hospital stay. If you have any questions about your discharge medications or the care you received while you were in the hospital after you are discharged, you can call the unit and ask to speak with the hospitalist or the covering physician. Once you are discharged, your primary care physician will handle any further medical issues. Please note that NO REFILLS for any discharge medications will be authorized once you are discharged, as it is imperative that you return to your primary care physician (or establish a relationship with a primary care physician if you do not have one).   Discharge  wound care:   Complete by: As directed    Increase activity slowly   Complete by: As directed        Discharge Medications:   Allergies as of 03/11/2023       Reactions   Oxycodone Anaphylaxis   Patient ended up in icu        Medication List     STOP taking these medications    lisinopril-hydrochlorothiazide 20-25 MG tablet Commonly known as: ZESTORETIC       TAKE these medications    apixaban 5 MG Tabs tablet Commonly known as: ELIQUIS Take 1 tablet (5 mg total) by mouth 2 (two) times daily.   diltiazem 120 MG 24 hr capsule Commonly known as: CARDIZEM CD Take 1 capsule (120 mg total) by mouth daily. Start taking on: March 12, 2023   escitalopram 20 MG tablet Commonly known as: LEXAPRO Take 20 mg by mouth daily.   furosemide 40 MG tablet Commonly known as: Lasix Take 1 tab daily and an extra pill if you are having leg swelling, difficulty breathing or weight gain.   pravastatin 10 MG tablet Commonly known as: PRAVACHOL Take 10 mg by mouth daily.               Durable Medical Equipment  (From admission, onward)           Start     Ordered   03/08/23 1552  For home use only DME oxygen  Once       Question Answer Comment  Length of Need Lifetime   Mode or (Route) Nasal cannula   Liters per Minute 4   Frequency Continuous (stationary and portable oxygen unit needed)   Oxygen conserving device Yes   Oxygen delivery system Gas      03/08/23 1551              Discharge Care Instructions  (From admission, onward)           Start     Ordered   03/11/23 0000  Discharge wound care:        03/11/23 1139  The results of significant diagnostics from this hospitalization (including imaging, microbiology, ancillary and laboratory) are listed below for reference.    Procedures and Diagnostic Studies:   DG CHEST PORT 1 VIEW  Result Date: 02/25/2023 CLINICAL DATA:  ET tube in place. Imaging to verify NG tube and  central line placement. EXAM: PORTABLE CHEST 1 VIEW COMPARISON:  Earlier same day chest radiograph at 0616 hours, CT angio chest 02/24/2023 FINDINGS: Of note, the patient is mildly rotated on this portable examination. Endotracheal tube tip projects 5.7 cm above the carina. Left IJ central venous catheter tip projects at the level of the superior cavoatrial junction. Enteric tube tip is below the diaphragm but not included on the field of view. Mildly enlarged cardiac silhouette, stable. Diffuse hazy interstitial thickening. The small patchy airspace opacity seen in the right upper lobe on yesterday's CT angio chest are not well visualized. No pleural effusion or pneumothorax. IMPRESSION: 1. Endotracheal tube tip projects 5.7 cm above the carina. 2. Left IJ central venous catheter tip projects at the level of the superior cavoatrial junction. 3. Enteric tube tip is below the diaphragm but not included on the field of view. 4. Mild cardiomegaly with findings of mild pulmonary edema. Electronically Signed   By: Sherron Ales M.D.   On: 02/25/2023 17:54   DG Abd 1 View  Result Date: 02/25/2023 CLINICAL DATA:  OG tube placement. Imaging to verify NG tube and central line placement. EXAM: ABDOMEN - 1 VIEW COMPARISON:  None Available. FINDINGS: Of note, only the mid to upper abdomen is visualized on the field of view. The far lateral right upper abdomen is not included on the image. Enteric tube tip projects at the midline upper abdomen, overlying the expected location of the distal gastric body/gastric antrum. Surgical clips are seen in the right upper abdomen. Nonobstructive bowel gas pattern in the visualized mid and upper abdomen. IMPRESSION: Enteric tube tip projects at the midline upper abdomen, overlying the expected location of the distal gastric body/gastric antrum. Electronically Signed   By: Sherron Ales M.D.   On: 02/25/2023 17:51   ECHOCARDIOGRAM COMPLETE  Result Date: 02/25/2023    ECHOCARDIOGRAM  REPORT   Patient Name:   [facility] Date of Exam: 02/25/2023 Medical Rec #:  [id number]           Height:       66.5 in Accession #:    [id number]          Weight:       272.3 lb Date of Birth:  [birth year]           BSA:          2.293 m Patient Age:    72 years            BP:           121/51 mmHg Patient Gender: F                   HR:           124 bpm. Exam Location:  Inpatient Procedure: 2D Echo, Cardiac Doppler, Color Doppler and Intracardiac            Opacification Agent Indications:    CHF - Acute Diastolic  History:        Patient has prior history of Echocardiogram examinations, most                 recent 12/28/2011. Signs/Symptoms:Shortness of Breath; Risk  Factors:Hypertension and Current Smoker. CKD.  Sonographer:    Milda Smart Referring Phys: [id number] GRACE E BOWSER  Sonographer Comments: Technically difficult study due to poor echo windows. Image acquisition challenging due to patient body habitus and Image acquisition challenging due to respiratory motion. IMPRESSIONS  1. Poor quality echo images despite Definity administration. Challenging study in the setting of tachycardia. Left ventricular ejection fraction is grossly normal. There is severe asymmetric left ventricular hypertrophy of the septal segment. Indeterminate diastolic filling due to E-A fusion. Consider Limited Echo with contrast when tachycardia is resolved.  2. Right ventricular systolic function was not well visualized. The right ventricular size is normal. Tricuspid regurgitation signal is inadequate for assessing PA pressure.  3. The mitral valve was not well visualized. No evidence of mitral valve regurgitation. No evidence of mitral stenosis.  4. The aortic valve was not well visualized. Aortic valve regurgitation is not visualized. No aortic stenosis is present.  5. The inferior vena cava is dilated in size with <50% respiratory variability, suggesting right atrial pressure of 15 mmHg. Comparison(s): No  prior Echocardiogram. FINDINGS  Left Ventricle: The left ventricle has normal function. The left ventricle has no regional wall motion abnormalities. Definity contrast agent was given IV to delineate the left ventricular endocardial borders. The left ventricular internal cavity size was normal in size. There is severe asymmetric left ventricular hypertrophy of the septal segment. Indeterminate diastolic filling due to E-A fusion. Right Ventricle: The right ventricular size is normal. No increase in right ventricular wall thickness. Right ventricular systolic function was not well visualized. Tricuspid regurgitation signal is inadequate for assessing PA pressure. Left Atrium: Left atrial size was normal in size. Right Atrium: Right atrial size was normal in size. Pericardium: There is no evidence of pericardial effusion. Mitral Valve: The mitral valve was not well visualized. No evidence of mitral valve regurgitation. No evidence of mitral valve stenosis. Tricuspid Valve: The tricuspid valve is not well visualized. Tricuspid valve regurgitation is not demonstrated. No evidence of tricuspid stenosis. Aortic Valve: The aortic valve was not well visualized. Aortic valve regurgitation is not visualized. No aortic stenosis is present. Pulmonic Valve: The pulmonic valve was not well visualized. Pulmonic valve regurgitation is not visualized. No evidence of pulmonic stenosis. Aorta: The aortic root and ascending aorta are structurally normal, with no evidence of dilitation. Venous: The inferior vena cava is dilated in size with less than 50% respiratory variability, suggesting right atrial pressure of 15 mmHg. IAS/Shunts: No atrial level shunt detected by color flow Doppler.  LEFT VENTRICLE PLAX 2D LVIDd:         3.60 cm   Diastology LVIDs:         3.10 cm   LV e' medial:  5.77 cm/s LV PW:         1.30 cm   LV e' lateral: 10.00 cm/s LV IVS:        1.70 cm LVOT diam:     2.00 cm LV SV:         51 LV SV Index:   22 LVOT Area:      3.14 cm  LEFT ATRIUM             Index        RIGHT ATRIUM           Index LA diam:        2.40 cm 1.05 cm/m   RA Area:     14.60 cm LA Vol (A2C):   63.0  ml 27.48 ml/m  RA Volume:   34.70 ml  15.13 ml/m LA Vol (A4C):   47.5 ml 20.72 ml/m LA Biplane Vol: 52.5 ml 22.90 ml/m  AORTIC VALVE LVOT Vmax:   116.00 cm/s LVOT Vmean:  79.700 cm/s LVOT VTI:    0.162 m  AORTA Ao Root diam: 2.90 cm Ao Asc diam:  3.20 cm  SHUNTS Systemic VTI:  0.16 m Systemic Diam: 2.00 cm Vishnu Priya Mallipeddi Electronically signed by Winfield Rast Mallipeddi Signature Date/Time: 02/25/2023/11:31:58 AM    Final    DG Chest Port 1 View  Result Date: 02/25/2023 CLINICAL DATA:  Acute respiratory failure/hypoxia. EXAM: PORTABLE CHEST 1 VIEW COMPARISON:  02/24/2023 FINDINGS: Lungs are adequately inflated with minimal prominence of the central pulmonary vessels suggesting mild vascular congestion. No lobar consolidation or effusion. No pneumothorax. Mild stable cardiomegaly. Remainder of the exam is unchanged. IMPRESSION: Mild cardiomegaly with suggestion of mild vascular congestion. Electronically Signed   By: Elberta Fortis M.D.   On: 02/25/2023 09:09   CT Angio Chest PE W and/or Wo Contrast  Result Date: 02/24/2023 CLINICAL DATA:  Shortness of breath. EXAM: CT ANGIOGRAPHY CHEST WITH CONTRAST TECHNIQUE: Multidetector CT imaging of the chest was performed using the standard protocol during bolus administration of intravenous contrast. Multiplanar CT image reconstructions and MIPs were obtained to evaluate the vascular anatomy. RADIATION DOSE REDUCTION: This exam was performed according to the departmental dose-optimization program which includes automated exposure control, adjustment of the mA and/or kV according to patient size and/or use of iterative reconstruction technique. CONTRAST:  OMNIPAQUE IOHEXOL 350 MG/ML SOLN COMPARISON:  April 10, 2012. FINDINGS: Cardiovascular: Satisfactory opacification of the pulmonary arteries  to the segmental level. No evidence of pulmonary embolism. Mild cardiomegaly. No pericardial effusion. Mediastinum/Nodes: Thyroid gland and esophagus are unremarkable. Mildly enlarged right hilar adenopathy is noted with largest lymph node measuring 15 mm in minor axis. 11 mm precarinal lymph node is noted. Mildly enlarged right supraclavicular adenopathy is noted with the largest node measuring 7 mm 12 mm right paratracheal lymph node is noted which is slightly decreased compared to prior exam. Lungs/Pleura: No pneumothorax or pleural effusion is noted. Bilateral bronchial wall thickening is noted, particularly in the lower lobes concerning for bronchitis. Minimal bibasilar subsegmental atelectasis is noted. Several small patchy airspace opacities are noted in the right upper lobe concerning for possible multifocal pneumonia. Upper Abdomen: No acute abnormality. Musculoskeletal: No chest wall abnormality. No acute or significant osseous findings. Review of the MIP images confirms the above findings. IMPRESSION: No definite evidence of pulmonary embolus. Bilateral bronchial wall thickening is noted, particularly in the lower lobes, concerning for acute or chronic bronchitis. Minimal bibasilar subsegmental atelectasis is noted as well. Several small patchy airspace opacities are noted in the right upper lobe concerning for possible multifocal pneumonia. Mildly enlarged right hilar, precarinal and right supraclavicular adenopathy is noted which most likely is inflammatory or reactive in etiology. Electronically Signed   By: Lupita Raider M.D.   On: 02/24/2023 15:04   DG Chest Port 1 View  Result Date: 02/24/2023 CLINICAL DATA:  Shortness of breath EXAM: PORTABLE CHEST 1 VIEW COMPARISON:  04/10/2012 x-ray and CT angiogram FINDINGS: Enlarged cardiopericardial silhouette. No pneumothorax or effusion. No consolidation. Increasing interstitial changes with some questionable Kerley B-lines on the right. Trace edema.  Overlapping cardiac leads. Degenerative changes of the spine IMPRESSION: Enlarged heart with increasing interstitial changes with possible Kerley B-lines. Possible mild interstitial edema. Recommend follow-up Electronically Signed   By: Scarlette Shorts  Chales Abrahams M.D.   On: 02/24/2023 10:23     Labs:   Basic Metabolic Panel: Recent Labs  Lab 03/05/23 0740 03/06/23 0422 03/07/23 0523 03/08/23 0512 03/09/23 0525 03/10/23 0431 03/11/23 0457  NA 135 137 141 138 137 136 137  K 3.1* 3.3* 3.7 3.5 3.5 3.4* 3.2*  CL 89* 91* 92* 92* 93* 90* 90*  CO2 39* 36* 39* 38* 37* 36* 36*  GLUCOSE 159* 189* 107* 116* 160* 155* 107*  BUN 14 16 15 16 15 16 16   CREATININE 0.52 0.44 0.43* 0.48 0.47 0.42* 0.49  CALCIUM 8.9 8.6* 8.9 8.9 8.9 8.7* 8.5*  MG 2.3 2.1  --   --   --   --   --    GFR Estimated Creatinine Clearance: 82.9 mL/min (by C-G formula based on SCr of 0.49 mg/dL). Liver Function Tests: Recent Labs  Lab 03/07/23 0523 03/08/23 0512 03/09/23 0525 03/10/23 0431 03/11/23 0457  AST 55* 43* 32 37 32  ALT 55* 53* 45* 48* 48*  ALKPHOS 48 45 45 47 48  BILITOT 0.8 0.8 0.8 1.0 0.8  PROT 7.6 7.2 7.1 7.6 7.3  ALBUMIN 3.3* 3.3* 3.2* 3.5 3.5   No results for input(s): "LIPASE", "AMYLASE" in the last 168 hours. No results for input(s): "AMMONIA" in the last 168 hours. Coagulation profile No results for input(s): "INR", "PROTIME" in the last 168 hours.  CBC: Recent Labs  Lab 03/05/23 0740 03/06/23 0422 03/10/23 0431  WBC 11.1* 10.9* 15.1*  NEUTROABS  --   --  11.5*  HGB 13.4 12.4 11.8*  HCT 44.4 42.0 38.7  MCV 95.5 96.6 95.1  PLT 268 238 202   Cardiac Enzymes: No results for input(s): "CKTOTAL", "CKMB", "CKMBINDEX", "TROPONINI" in the last 168 hours. BNP: Invalid input(s): "POCBNP" CBG: No results for input(s): "GLUCAP" in the last 168 hours. D-Dimer No results for input(s): "DDIMER" in the last 72 hours. Hgb A1c No results for input(s): "HGBA1C" in the last 72 hours. Lipid Profile No  results for input(s): "CHOL", "HDL", "LDLCALC", "TRIG", "CHOLHDL", "LDLDIRECT" in the last 72 hours. Thyroid function studies No results for input(s): "TSH", "T4TOTAL", "T3FREE", "THYROIDAB" in the last 72 hours.  Invalid input(s): "FREET3" Anemia work up No results for input(s): "VITAMINB12", "FOLATE", "FERRITIN", "TIBC", "IRON", "RETICCTPCT" in the last 72 hours. Microbiology No results found for this or any previous visit (from the past 240 hour(s)).  Time coordinating discharge: 55 minutes  Signed: Anallely Rosell  [facility] 03/11/2023, 11:39 AM

## 2023-03-11 NOTE — TOC Transition Note (Signed)
Transition of Care [facility]) - CM/SW Discharge Note   Patient Details  Name: Kimberly Love MRN: [id number] Date of Birth: 04-14-1951  Transition of Care [facility]) CM/SW Contact:  Adrian Prows, RN Phone Number: 03/11/2023, 11:51 AM   Clinical Narrative:    D/C orders received; home oxygen w/ [employer]; HH services w/ [facility]; no TOC needs.   Final next level of care: Home/Self Care Barriers to Discharge: No Barriers Identified   Patient Goals and CMS Choice CMS Medicare.gov Compare Post Acute Care list provided to:: Patient Choice offered to / list presented to : Patient  Discharge Placement                         Discharge Plan and Services Additional resources added to the After Visit Summary for     Discharge Planning Services: CM Consult            DME Arranged: Oxygen DME Agency: [employer] Date DME Agency Contacted: 03/10/23 Time DME Agency Contacted: 1135 Representative spoke with at DME Agency: Vaughan Basta HH Arranged: PT HH Agency: [facility] Date [facility] Agency Contacted: 03/10/23 Time HH Agency Contacted: 1135 Representative spoke with at [facility] Agency: Haywood Lasso gardener  Social Determinants of Health (SDOH) Interventions SDOH Screenings   Food Insecurity: No Food Insecurity (02/24/2023)  Housing: Low Risk  (02/24/2023)  Transportation Needs: No Transportation Needs (02/24/2023)  Utilities: Not At Risk (02/24/2023)  Tobacco Use: High Risk (02/24/2023)     Readmission Risk Interventions    03/01/2023    1:36 PM  Readmission Risk Prevention Plan  Post Dischage Appt Complete  Medication Screening Complete  Transportation Screening Complete

## 2023-03-30 NOTE — Progress Notes (Deleted)
Cardiology Office Note Date:  03/30/2023  Patient ID:  Aleira, Burzinski [birth year], MRN [id number] PCP:  Benita Stabile, MD  Cardiologist:  *** Electrophysiologist: ***  ***refresh   Chief Complaint: *** post hospital  History of Present Illness: CABELLA ANGELOFF is a 72 y.o. female with history of COPD (described as severe), morbid obesity, smoker, HTN,  AFib  She was admitted 02/24/23 with PNA (parainfulenza) >> acute on chronic respiratory failure > intubated, during which she had rapid Afib self converted, once extubated on tele floor had more AFib, cardiology was consulted.  Ultimately started on OAC, rate controlled, diuresed for diastolic HF, and out patient follow up and +/- DCCV if remained in AFib TTE done with severe asymmetrical septal hypertrophy, with some differing opinions on this by the cardiologists that saw her. Discharged 03/11/23 Home w/O2   *** eliquis, dose, labs, bleeding *** AFib, rate, DCCV *** needs gen cards for [facility], >> repeat liimited echo with rate controlled, in SR   AFib Hx Diagnosed July 2024    Past Medical History:  Diagnosis Date   Anxiety    Arthritis    OA- L knee    Chronic kidney disease    Complication of anesthesia    had seizure in 1973  while waking up, after childbirth - vaginal  birth    Depression    GERD (gastroesophageal reflux disease)    Hiatal hernia    Hypertension    dr Timothy Lasso   [facility]  [place]   PONV (postoperative nausea and vomiting)    Seizures (HCC)    x1- 1973- post childbirth     Past Surgical History:  Procedure Laterality Date   ABDOMINAL HYSTERECTOMY     partial    BREAST SURGERY     bio  2012   neg   CATARACT EXTRACTION W/PHACO Left 01/31/2020   Procedure: CATARACT EXTRACTION PHACO AND INTRAOCULAR LENS PLACEMENT (IOC);  Surgeon: Fabio Pierce, MD;  Location: [facility];  Service: Ophthalmology;  Laterality: Left;  CDE: 25.85   CATARACT EXTRACTION W/PHACO Right 02/14/2020   Procedure: CATARACT  EXTRACTION PHACO AND INTRAOCULAR LENS PLACEMENT RIGHT EYE;  Surgeon: Fabio Pierce, MD;  Location: [facility];  Service: Ophthalmology;  Laterality: Right;  CDE: 15.45   CHOLECYSTECTOMY     COLONOSCOPY N/A 05/31/2018   Procedure: COLONOSCOPY;  Surgeon: Malissa Hippo, MD;  Location: [facility];  Service: Endoscopy;  Laterality: N/A;  830   COLONOSCOPY N/A 05/09/2019   Procedure: COLONOSCOPY;  Surgeon: Malissa Hippo, MD;  Location: [facility];  Service: Endoscopy;  Laterality: N/A;  930   ESOPHAGOGASTRODUODENOSCOPY N/A 02/05/2016   Procedure: ESOPHAGOGASTRODUODENOSCOPY (EGD);  Surgeon: Malissa Hippo, MD;  Location: [facility];  Service: Endoscopy;  Laterality: N/A;  12:25 - moved to 10:40 - Ann notified pt   orthorscopy     rt knee   POLYPECTOMY  05/31/2018   Procedure: POLYPECTOMY;  Surgeon: Malissa Hippo, MD;  Location: [facility];  Service: Endoscopy;;  colon   POLYPECTOMY  05/09/2019   Procedure: POLYPECTOMY;  Surgeon: Malissa Hippo, MD;  Location: [facility];  Service: Endoscopy;;   TONSILLECTOMY     TOTAL KNEE ARTHROPLASTY  12/26/2011   Procedure: TOTAL KNEE ARTHROPLASTY;  Surgeon: Raymon Mutton, MD;  Location: [facility] OR;  Service: Orthopedics;  Laterality: Right;  right total knee arthroplasty   TOTAL KNEE ARTHROPLASTY  07/02/2012   left knee   TOTAL KNEE ARTHROPLASTY  07/02/2012   Procedure: TOTAL KNEE ARTHROPLASTY;  Surgeon: Raymon Mutton, MD;  [facility]: [facility];  Service: Orthopedics;  Laterality: Left;  left total knee replacement   TUBAL LIGATION      Current Outpatient Medications  Medication Sig Dispense Refill   apixaban (ELIQUIS) 5 MG TABS tablet Take 1 tablet (5 mg total) by mouth 2 (two) times daily. 60 tablet 0   diltiazem (CARDIZEM CD) 120 MG 24 hr capsule Take 1 capsule (120 mg total) by mouth daily. 30 capsule 0   escitalopram (LEXAPRO) 20 MG tablet Take 20 mg by mouth daily.     furosemide (LASIX) 40 MG tablet Take 1 tab daily and an extra pill if you  are having leg swelling, difficulty breathing or weight gain. 3060 tablet 0   pravastatin (PRAVACHOL) 10 MG tablet Take 10 mg by mouth daily.     No current facility-administered medications for this visit.    Allergies:   Oxycodone   Social History:  The patient  reports that she has been smoking cigarettes. She has a 48 pack-year smoking history. She has never used smokeless tobacco. She reports that she does not drink alcohol and does not use drugs.   Family History:  The patient's family history is not on file.  ROS:  Please see the history of present illness.    All other systems are reviewed and otherwise negative.   PHYSICAL EXAM:  VS:  There were no vitals taken for this visit. BMI: There is no height or weight on file to calculate BMI. Well nourished, well developed, in no acute distress HEENT: normocephalic, atraumatic Neck: no JVD, carotid bruits or masses Cardiac:  *** RRR; no significant murmurs, no rubs, or gallops Lungs:  *** CTA b/l, no wheezing, rhonchi or rales Abd: soft, nontender MS: no deformity or *** atrophy Ext: *** no edema Skin: warm and dry, no rash Neuro:  No gross deficits appreciated Psych: euthymic mood, full affect   EKG:  Done today and reviewed by myself shows  ***  02/25/23: TTE 1. Poor quality echo images despite Definity administration. Challenging  study in the setting of tachycardia. Left ventricular ejection fraction is  grossly normal. There is severe asymmetric left ventricular hypertrophy of  the septal segment.  Indeterminate diastolic filling due to E-A fusion. Consider Limited Echo  with contrast when tachycardia is resolved.   2. Right ventricular systolic function was not well visualized. The right  ventricular size is normal. Tricuspid regurgitation signal is inadequate  for assessing PA pressure.   3. The mitral valve was not well visualized. No evidence of mitral valve  regurgitation. No evidence of mitral stenosis.   4.  The aortic valve was not well visualized. Aortic valve regurgitation  is not visualized. No aortic stenosis is present.   5. The inferior vena cava is dilated in size with <50% respiratory  variability, suggesting right atrial pressure of 15 mmHg.   Comparison(s): No prior Echocardiogram.   Recent Labs: 03/02/2023: B Natriuretic Peptide 177.7 03/03/2023: TSH 4.876 03/06/2023: Magnesium 2.1 03/10/2023: Hemoglobin 11.8; Platelets 202 03/11/2023: ALT 48; BUN 16; Creatinine, Ser 0.49; Potassium 3.2; Sodium 137  02/26/2023: Triglycerides 96   CrCl cannot be calculated (Unknown ideal weight.).   Wt Readings from Last 3 Encounters:  03/11/23 255 lb 4.7 oz (115.8 kg)  01/31/20 260 lb (117.9 kg)  01/29/20 260 lb (117.9 kg)     Other studies reviewed: Additional studies/records reviewed today include: summarized above  ASSESSMENT AND PLAN:  Persistent AFib CHA2DS2Vasc is 4, on Eliquis, *** appropriately dosed ***    2.   LVH 3.   Diastolic HF ***  Disposition: F/u with ***  Current medicines are reviewed at length with the patient today.  The patient did not have any concerns regarding medicines.  Norma Fredrickson, PA-C 03/30/2023 1:26 PM     [facility] [address] [place] [address] [phone] (office)  [phone] (fax)

## 2023-03-31 ENCOUNTER — Ambulatory Visit: Payer: 59 | Admitting: Physician Assistant

## 2023-04-25 ENCOUNTER — Ambulatory Visit (INDEPENDENT_AMBULATORY_CARE_PROVIDER_SITE_OTHER): Payer: Managed Care, Other (non HMO)

## 2023-04-25 ENCOUNTER — Ambulatory Visit: Payer: Managed Care, Other (non HMO) | Admitting: Adult Health

## 2023-04-25 ENCOUNTER — Encounter: Payer: Self-pay | Admitting: Adult Health

## 2023-04-25 VITALS — BP 112/54 | HR 83 | Temp 98.0°F | Ht 67.0 in | Wt 271.0 lb

## 2023-04-25 DIAGNOSIS — R400 Somnolence: Secondary | ICD-10-CM

## 2023-04-25 DIAGNOSIS — J9601 Acute respiratory failure with hypoxia: Secondary | ICD-10-CM | POA: Diagnosis not present

## 2023-04-25 DIAGNOSIS — J189 Pneumonia, unspecified organism: Secondary | ICD-10-CM

## 2023-04-25 DIAGNOSIS — Z23 Encounter for immunization: Secondary | ICD-10-CM

## 2023-04-25 DIAGNOSIS — F17200 Nicotine dependence, unspecified, uncomplicated: Secondary | ICD-10-CM

## 2023-04-25 DIAGNOSIS — Z87891 Personal history of nicotine dependence: Secondary | ICD-10-CM

## 2023-04-25 DIAGNOSIS — J4521 Mild intermittent asthma with (acute) exacerbation: Secondary | ICD-10-CM

## 2023-04-25 DIAGNOSIS — I480 Paroxysmal atrial fibrillation: Secondary | ICD-10-CM

## 2023-04-25 DIAGNOSIS — J9611 Chronic respiratory failure with hypoxia: Secondary | ICD-10-CM

## 2023-04-25 DIAGNOSIS — R0683 Snoring: Secondary | ICD-10-CM

## 2023-04-25 NOTE — Patient Instructions (Addendum)
Set up for Split night sleep study  Refer to Lung Cancer CT chest screening program.  Continue on Oxygen 3-4l/m  Order for POC  Follow up with Cardiology as planned next month .  Activity as tolerated.  Prevnar 20 vaccine today .  Great job not smoking.  Follow up with 6 weeks with Dr. Wynona Neat or Vermell Madrid NP with  PFT and As needed  (30 min slot )

## 2023-04-25 NOTE — Progress Notes (Unsigned)
@Patient  ID: Kimberly Love, female    DOB: Mar 26, 1951, 72 y.o.   MRN: [id number]  Chief Complaint  Patient presents with   Hospitalization Follow-up    Referring provider: Benita Stabile, MD  HPI: 72 year old female former smoker seen for pulmonary/critical care consult during hospitalization July 2024 for acute hypoxic and hypercarbic respiratory failure in the setting of parainfluenza pneumonia and asthmatic bronchitic exacerbation.  TEST/EVENTS :   04/25/2023 Post hospital follow up , asthmatic bronchitis, chronic respiratory failure Patient returns for a posthospital follow-up.  Patient was recently admitted with a prolonged hospitalization last month for acute hypoxic and hypercarbic respiratory failure in the setting of parainfluenza pneumonia and asthmatic bronchitic exacerbation.  Patient presented with acute symptoms of cough and shortness of breath.  On arrival was severely hypoxic.  CT chest showed patchy airspace opacities concerning for multifocal pneumonia.  Respiratory panel was positive for parainfluenza.  She required BiPAP support initially but continued to get progressively worse.  And required intubation.  Patient was treated with aggressive IV antibiotics, steroids and nebulized bronchodilators.  Was successfully extubated.  She was weaned down on oxygen but was unable to wean off and was discharged on home O2. patient has a very strong heavy history of smoking.  Prior to admission was smoking 2 packs of cigarettes daily.  Quit smoking since discharge.  Was recommended for an outpatient sleep evaluation as had nocturnal hypoxemia and BMI is greater than 40.  Was also treated for acute diastolic heart failure.  Patient was seen by cardiology.  Improved with diuretics.  Hospital stay was complicated by new onset A-fib with RVR.  She was started on Cardizem and Eliquis.  Since discharge patient says she is feeling much better.  Her cough and congestion have totally resolved.   She says she has some shortness of breath with activities.  She says she is very sedentary at home.  Is able do some light chores in her [place] but is unable to drive. Patient does have snoring and daytime sleepiness.  Has never had a sleep study before.  No previous diagnosis of COPD or asthma.  We discussed the lung cancer CT screening program. Patient remains on oxygen 3 L.  Walk test in office showed O2 saturations dropped to 83% on room air.  Required 4 L of pulsed oxygen to maintain O2 saturations greater than 88%.  And 3 L of continuous oxygen to maintain O2 saturation greater than 88%.    Allergies  Allergen Reactions   Oxycodone Anaphylaxis    Patient ended up in icu    There is no immunization history for the selected administration types on file for this patient.  Past Medical History:  Diagnosis Date   Anxiety    Arthritis    OA- L knee    Chronic kidney disease    Complication of anesthesia    had seizure in 1973  while waking up, after childbirth - vaginal  birth    Depression    GERD (gastroesophageal reflux disease)    Hiatal hernia    Hypertension    dr Timothy Lasso   [facility]  [place]   PONV (postoperative nausea and vomiting)    Seizures (HCC)    x1- 1973- post childbirth     Tobacco History: Social History   Tobacco Use  Smoking Status Every Day   Current packs/day: 1.50   Average packs/day: 1.5 packs/day for 32.0 years (48.0 ttl pk-yrs)   Types: Cigarettes  Smokeless Tobacco Never  Tobacco Comments   Quit smoking cigarettes 2 months ago.  Still smokes an occasional cigar.  04/25/2023 hfb   Ready to quit: Not Answered Counseling given: Not Answered Tobacco comments: Quit smoking cigarettes 2 months ago.  Still smokes an occasional cigar.  04/25/2023 hfb   Outpatient Medications Prior to Visit  Medication Sig Dispense Refill   acetaminophen (TYLENOL) 325 MG tablet Take 325 mg by mouth every 6 (six) hours as needed.     cholecalciferol (VITAMIN D3) 25 MCG  (1000 UNIT) tablet Take 1,000 Units by mouth daily.     escitalopram (LEXAPRO) 20 MG tablet Take 20 mg by mouth daily.     furosemide (LASIX) 40 MG tablet Take 1 tab daily and an extra pill if you are having leg swelling, difficulty breathing or weight gain. 3060 tablet 0   multivitamin (CENTRUM) chewable tablet Chew 1 tablet by mouth daily.     Potassium Chloride ER 20 MEQ TBCR Take 1 tablet by mouth daily.     pravastatin (PRAVACHOL) 10 MG tablet Take 10 mg by mouth daily.     apixaban (ELIQUIS) 5 MG TABS tablet Take 1 tablet (5 mg total) by mouth 2 (two) times daily. 60 tablet 0   diltiazem (CARDIZEM CD) 120 MG 24 hr capsule Take 1 capsule (120 mg total) by mouth daily. 30 capsule 0   No facility-administered medications prior to visit.     Review of Systems:   Constitutional:   No  weight loss, night sweats,  Fevers, chills, + fatigue, or  lassitude.  HEENT:   No headaches,  Difficulty swallowing,  Tooth/dental problems, or  Sore throat,                No sneezing, itching, ear ache, nasal congestion, post nasal drip,   CV:  No chest pain,  Orthopnea, PND, swelling in lower extremities, anasarca, dizziness, palpitations, syncope.   GI  No heartburn, indigestion, abdominal pain, nausea, vomiting, diarrhea, change in bowel habits, loss of appetite, bloody stools.   Resp:   No excess mucus, no productive cough,  No non-productive cough,  No coughing up of blood.  No change in color of mucus.  No wheezing.  No chest wall deformity  Skin: no rash or lesions.  GU: no dysuria, change in color of urine, no urgency or frequency.  No flank pain, no hematuria   MS:  No joint pain or swelling.  No decreased range of motion.  No back pain.    Physical Exam  BP (!) 112/54 (BP Location: Left Arm, Patient Position: Sitting, Cuff Size: Large)   Pulse 83   Temp 98 F (36.7 C) (Oral)   Ht 5\' 7"  (1.702 m)   Wt 271 lb (122.9 kg)   SpO2 92%   BMI 42.44 kg/m   GEN: A/Ox3; pleasant , NAD,  on O2    HEENT:  [place]/AT,  NOSE-clear, THROAT-clear, no lesions, no postnasal drip or exudate noted. Class 3-4 MP airway   NECK:  Supple w/ fair ROM; no JVD; normal carotid impulses w/o bruits; no thyromegaly or nodules palpated; no lymphadenopathy.    RESP  Clear  P & A; w/o, wheezes/ rales/ or rhonchi. no accessory muscle use, no dullness to percussion  CARD:  RRR, no m/r/g, tr peripheral edema, pulses intact, no cyanosis or clubbing.  GI:   Soft & nt; nml bowel sounds; no organomegaly or masses detected.   Musco: Warm bil, no deformities or joint swelling noted.   Neuro:  alert, no focal deficits noted.    Skin: Warm, no lesions or rashes    Lab Results:  CBC   BMET ProBNP   Imaging: No results found.  Administration History     None           No data to display          No results found for: "NITRICOXIDE"      Assessment & Plan:   No problem-specific Assessment & Plan notes found for this encounter.     Rubye Oaks, NP 04/25/2023

## 2023-04-26 DIAGNOSIS — J9611 Chronic respiratory failure with hypoxia: Secondary | ICD-10-CM | POA: Insufficient documentation

## 2023-04-26 DIAGNOSIS — J45909 Unspecified asthma, uncomplicated: Secondary | ICD-10-CM | POA: Insufficient documentation

## 2023-04-26 DIAGNOSIS — J189 Pneumonia, unspecified organism: Secondary | ICD-10-CM | POA: Insufficient documentation

## 2023-04-26 DIAGNOSIS — Z87891 Personal history of nicotine dependence: Secondary | ICD-10-CM | POA: Insufficient documentation

## 2023-04-26 DIAGNOSIS — R0683 Snoring: Secondary | ICD-10-CM | POA: Insufficient documentation

## 2023-04-26 NOTE — Assessment & Plan Note (Signed)
New onset A-fib during hospitalization and acute illness.  Patient continue on current regimen and follow-up with cardiology

## 2023-04-26 NOTE — Assessment & Plan Note (Addendum)
Snoring, BMI at 42, nocturnal hypoxemia all suspicious for underlying sleep apnea.  With patient's underlying medical problems and ongoing need for oxygen therapy will need an in lab sleep study.  Will set patient up for a split-night sleep study. Plan  Patient Instructions  Set up for Split night sleep study  Refer to Lung Cancer CT chest screening program.  Continue on Oxygen 3-4l/m  Order for POC  Follow up with Cardiology as planned next month .  Activity as tolerated.  Prevnar 20 vaccine today .  Great job not smoking.  Follow up with 6 weeks with Dr. Wynona Neat or Mea Ozga NP with  PFT and As needed  (30 min slot )

## 2023-04-26 NOTE — Assessment & Plan Note (Signed)
Patient has a very strong smoking history.  Refer to the lung cancer CT screening program.

## 2023-04-26 NOTE — Assessment & Plan Note (Signed)
Recent hospitalization for multifocal pneumonia in the setting of parainfluenza virus.  Patient is clinically improved.  Completed full course of antibiotics.  Will check checks x-ray for clearance.

## 2023-04-26 NOTE — Assessment & Plan Note (Signed)
Recent asthmatic bronchitic exacerbation.  In the setting of parainfluenza virus.  Patient is clinically improved.  Patient is at risk for underlying COPD.  Has a strong smoking history.  Will set patient up for PFTs on return visit.  Clinically symptoms have improved.  She has no current cough or wheezing.  Hold on inhalers at this time.  She was congratulated on smoking cessation.

## 2023-04-26 NOTE — Assessment & Plan Note (Signed)
Recent admission with acute hypoxic and hypercarbic respiratory failure in the setting of parainfluenza pneumonia and asthmatic bronchitic exacerbation.  Patient has improved but continues to require oxygen due to ongoing desaturations.  Patient is unable to carry heavy tanks.  Will need a portable oxygen concentrator to help with mobility.  O2 saturations in the office on room air 83% required 4 L of pulsed oxygen on POC device to maintain O2 saturation greater than 88% may use 3 L of continuous flow of oxygen at rest and with activity at home.

## 2023-05-09 NOTE — Progress Notes (Unsigned)
Cardiology Office Note:  .   Date:  05/09/2023  ID:  Kimberly Love, DOB August 14, 1951, MRN [id number] PCP: Benita Stabile, MD  [facility] Providers Cardiologist: new to [facility] last hospitalization   History of Present Illness: .   Kimberly Love is a 72 y.o. female w/PMHx of HTN, COPD, Afib. Morbid obesity, RBBB  Hospitalized July 2024 with VDRF/PNA/parainfulenza, COPD during which she developed AFib, TTE with normal LV function and interpreted as severe asymmetric left ventricular hypertrophy, Dr Jens Som and Dr. Rennis Golden did not feel the same Treated with a/c and diltiazem > had spontaneous CV. She was diuresed as well Cardiology signed off 03/06/23 Discharged 03/11/23 w/DNR status Home O2 HS BIPAP (though reported not compliant)   She saw pulmonary 04/25/23, described w/asthmatic bronchitis, chronic respiratory failure.  O2 desat on RA w/walk to 83% With strong smoking hx, planned for PFTs, sleep study, and lung ca screening CXR this day with trace pleural effusions  Today's visit is scheduled as post hospital f/u  ROS:   She is accompanied by her husband. Since home making small strides in increasing exertional capacity/functional status. Denies any known medical problem prior to her hospitalization  No CP, palpitations or cardiac awareness before or since her hospitalization No near syncope or syncope. No dizzy spells She is tolerating current meds, no bleeding or signs of bleeding  She does report that for quite some time before her hospital stay that she has had very swollen legs Has a cream and wraps that they do (via her PMD) Since being on lasix they may be a bit better, has alowas had some skin discoloration  (erythema) and blisters sporadically.  As we discuss her lasix and any extra dosing, seems that she was under the impression of told that she should NOT take potassium when taking the lasix because it would just was it out.  Seems instructed to take the  K+ only 2 days a week. She takes the lasix daily occasionally a 2nd dose in a day   Arrhythmia/AAD hx AFib diagnosed during hospitalization July 2024 w/respiratory failure/PNA, intubated recurrent post extubation  Studies Reviewed: Marland Kitchen    EKG done today and reviewed by myself SR 63bpm, 1st degree AVBlock , RBBB,    02/25/23: TTE 1. Poor quality echo images despite Definity administration. Challenging  study in the setting of tachycardia. Left ventricular ejection fraction is  grossly normal. There is severe asymmetric left ventricular hypertrophy of  the septal segment.  Indeterminate diastolic filling due to E-A fusion. Consider Limited Echo  with contrast when tachycardia is resolved.   2. Right ventricular systolic function was not well visualized. The right  ventricular size is normal. Tricuspid regurgitation signal is inadequate  for assessing PA pressure.   3. The mitral valve was not well visualized. No evidence of mitral valve  regurgitation. No evidence of mitral stenosis.   4. The aortic valve was not well visualized. Aortic valve regurgitation  is not visualized. No aortic stenosis is present.   5. The inferior vena cava is dilated in size with <50% respiratory  variability, suggesting right atrial pressure of 15 mmHg.   Comparison(s): No prior Echocardiogram.    Risk Assessment/Calculations:    Physical Exam:   VS:  There were no vitals taken for this visit.   Wt Readings from Last 3 Encounters:  04/25/23 271 lb (122.9 kg)  03/11/23 255 lb 4.7 oz (115.8 kg)  01/31/20 260 lb (117.9 kg)    GEN: Well  nourished, well developed in no acute distress NECK: No JVD; No carotid bruits CARDIAC: RRR, no murmurs, rubs, gallops RESPIRATORY:  CTA b/l without rales, wheezing or rhonchi  ABDOMEN: Soft, non-tender, non-distended EXTREMITIES:  marked b/l LE edema, slight erythema R>L with some blisters (no open wounds), this is described as her baseline   ASSESSMENT AND  PLAN: .    paroxysmal AFib CHA2DS2Vasc is 4, on eliquis, appropriately dosed unclear burden, no symptoms ? Triggered event Discussed the diagnosis, rational for [facility] and stroke prevention with AFib Eliquis is expensive, she is working with her PMD to find an affordable alternative, she is oping to avoid warfarin  CHF Asymmetric LVH on echo, no LVOT obst Diuresed during her hospitalization No prior HF hx, though she does report chronic significant LE edema I am concerned about her lytes, potassium No changes until labs are back. Though pending her K+ , likely plan BID lasix  Secondary hypercoagulable state 2/2 AFib  4.  HTN Looks OK   She would better benefit from general cardiology to follow her then EP I think. She very much liked Dr. Jens Som, will plan to transition to [facility] team     Dispo: see gen cards in 6 weeks or so, sooner if needed  Signed, Sheilah Pigeon, PA-C

## 2023-05-10 ENCOUNTER — Encounter: Payer: Self-pay | Admitting: Physician Assistant

## 2023-05-10 ENCOUNTER — Ambulatory Visit: Payer: Managed Care, Other (non HMO) | Attending: Physician Assistant | Admitting: Physician Assistant

## 2023-05-10 VITALS — BP 134/64 | HR 64 | Ht 66.0 in | Wt 275.0 lb

## 2023-05-10 DIAGNOSIS — I480 Paroxysmal atrial fibrillation: Secondary | ICD-10-CM

## 2023-05-10 DIAGNOSIS — I5032 Chronic diastolic (congestive) heart failure: Secondary | ICD-10-CM | POA: Diagnosis not present

## 2023-05-10 DIAGNOSIS — I10 Essential (primary) hypertension: Secondary | ICD-10-CM

## 2023-05-10 DIAGNOSIS — D6869 Other thrombophilia: Secondary | ICD-10-CM

## 2023-05-10 LAB — BASIC METABOLIC PANEL WITH GFR
BUN/Creatinine Ratio: 26 (ref 12–28)
BUN: 13 mg/dL (ref 8–27)
CO2: 28 mmol/L (ref 20–29)
Calcium: 9.8 mg/dL (ref 8.7–10.3)
Chloride: 97 mmol/L (ref 96–106)
Creatinine, Ser: 0.5 mg/dL — ABNORMAL LOW (ref 0.57–1.00)
Glucose: 101 mg/dL — ABNORMAL HIGH (ref 70–99)
Potassium: 4.7 mmol/L (ref 3.5–5.2)
Sodium: 138 mmol/L (ref 134–144)
eGFR: 100 mL/min/1.73

## 2023-05-10 NOTE — Patient Instructions (Signed)
Medication Instructions:   Your physician recommends that you continue on your current medications as directed. Please refer to the Current Medication list given to you today.   *If you need a refill on your cardiac medications before your next appointment, please call your pharmacy*   Lab Work: BMET TODAY    If you have labs (blood work) drawn today and your tests are completely normal, you will receive your results only by: MyChart Message (if you have MyChart) OR A paper copy in the mail If you have any lab test that is abnormal or we need to change your treatment, we will call you to review the results.   Testing/Procedures: NONE ORDERED  TODAY     Follow-Up: At [facility], you and your health needs are our priority.  As part of our continuing mission to provide you with exceptional heart care, we have created designated Provider Care Teams.  These Care Teams include your primary Cardiologist (physician) and Advanced Practice Providers (APPs -  Physician Assistants and Nurse Practitioners) who all work together to provide you with the care you need, when you need it.  We recommend signing up for the patient portal called "MyChart".  Sign up information is provided on this After Visit Summary.  MyChart is used to connect with patients for Virtual Visits (Telemedicine).  Patients are able to view lab/test results, encounter notes, upcoming appointments, etc.  Non-urgent messages can be sent to your provider as well.   To learn more about what you can do with MyChart, go to [email or web address].    Your next appointment:    6-8 week(s)  Provider:    Dr. Jens Som or Edd Fabian, FNP, Marjie Skiff, PA-C, Juanda Crumble, PA-C, or Azalee Course, [place]       Other Instructions

## 2023-05-11 ENCOUNTER — Other Ambulatory Visit: Payer: Self-pay | Admitting: *Deleted

## 2023-05-11 DIAGNOSIS — Z79899 Other long term (current) drug therapy: Secondary | ICD-10-CM

## 2023-05-11 MED ORDER — SPIRONOLACTONE 25 MG PO TABS: 12.5000 mg | ORAL_TABLET | Freq: Every day | ORAL | 2 refills | Status: DC

## 2023-06-01 ENCOUNTER — Telehealth: Payer: Self-pay | Admitting: Physician Assistant

## 2023-06-01 ENCOUNTER — Ambulatory Visit (HOSPITAL_BASED_OUTPATIENT_CLINIC_OR_DEPARTMENT_OTHER): Payer: Managed Care, Other (non HMO) | Admitting: Pulmonary Disease

## 2023-06-01 DIAGNOSIS — J9601 Acute respiratory failure with hypoxia: Secondary | ICD-10-CM

## 2023-06-01 LAB — PULMONARY FUNCTION TEST
DL/VA % pred: 109 %
DL/VA: 4.42 ml/min/mmHg/L
DLCO cor % pred: 52 %
DLCO cor: 11.11 ml/min/mmHg
DLCO unc % pred: 52 %
DLCO unc: 11.11 ml/min/mmHg
FEF 25-75 Post: 0.89 L/s
FEF 25-75 Pre: 0.61 L/s
FEF2575-%Change-Post: 46 %
FEF2575-%Pred-Post: 44 %
FEF2575-%Pred-Pre: 30 %
FEV1-%Change-Post: 5 %
FEV1-%Pred-Post: 37 %
FEV1-%Pred-Pre: 35 %
FEV1-Post: 0.93 L
FEV1-Pre: 0.88 L
FEV1FVC-%Change-Post: -1 %
FEV1FVC-%Pred-Pre: 101 %
FEV6-%Change-Post: 7 %
FEV6-%Pred-Post: 39 %
FEV6-%Pred-Pre: 36 %
FEV6-Post: 1.23 L
FEV6-Pre: 1.14 L
FEV6FVC-%Change-Post: 0 %
FEV6FVC-%Pred-Post: 104 %
FEV6FVC-%Pred-Pre: 104 %
FVC-%Change-Post: 7 %
FVC-%Pred-Post: 37 %
FVC-%Pred-Pre: 34 %
FVC-Post: 1.23 L
FVC-Pre: 1.14 L
Post FEV1/FVC ratio: 75 %
Post FEV6/FVC ratio: 100 %
Pre FEV1/FVC ratio: 77 %
Pre FEV6/FVC Ratio: 100 %
RV % pred: 114 %
RV: 2.73 L
TLC % pred: 74 %
TLC: 4.1 L

## 2023-06-01 NOTE — Patient Instructions (Signed)
Full PFT Performed Today  

## 2023-06-01 NOTE — Progress Notes (Signed)
Full PFT Performed today

## 2023-06-01 NOTE — Telephone Encounter (Signed)
New Message:       Patient says she need to talk to the nurse about a blood test that they want her to get.

## 2023-06-02 LAB — LAB REPORT - SCANNED: EGFR: 86

## 2023-06-05 ENCOUNTER — Telehealth: Payer: Self-pay | Admitting: Physician Assistant

## 2023-06-05 NOTE — Telephone Encounter (Signed)
Patient called and said that she has already done bloodwork at [facility]. Wants to know if she still needs to take additional blood test

## 2023-06-05 NOTE — Telephone Encounter (Signed)
Returned call to patient and she states she does not need help she has already spoken to someone about her blood work

## 2023-06-06 ENCOUNTER — Ambulatory Visit: Payer: 59 | Admitting: Adult Health

## 2023-06-08 ENCOUNTER — Encounter: Payer: Self-pay | Admitting: Internal Medicine

## 2023-06-20 ENCOUNTER — Encounter: Payer: Self-pay | Admitting: Adult Health

## 2023-06-20 ENCOUNTER — Ambulatory Visit (INDEPENDENT_AMBULATORY_CARE_PROVIDER_SITE_OTHER): Payer: Managed Care, Other (non HMO) | Admitting: Adult Health

## 2023-06-20 VITALS — BP 132/70 | HR 81 | Temp 97.6°F | Ht 66.0 in | Wt 275.8 lb

## 2023-06-20 DIAGNOSIS — J849 Interstitial pulmonary disease, unspecified: Secondary | ICD-10-CM | POA: Diagnosis not present

## 2023-06-20 DIAGNOSIS — R9389 Abnormal findings on diagnostic imaging of other specified body structures: Secondary | ICD-10-CM

## 2023-06-20 DIAGNOSIS — J9611 Chronic respiratory failure with hypoxia: Secondary | ICD-10-CM | POA: Diagnosis not present

## 2023-06-20 DIAGNOSIS — I509 Heart failure, unspecified: Secondary | ICD-10-CM | POA: Diagnosis not present

## 2023-06-20 DIAGNOSIS — R0683 Snoring: Secondary | ICD-10-CM

## 2023-06-20 DIAGNOSIS — J449 Chronic obstructive pulmonary disease, unspecified: Secondary | ICD-10-CM | POA: Insufficient documentation

## 2023-06-20 MED ORDER — TRELEGY ELLIPTA 100-62.5-25 MCG/ACT IN AEPB: 1.0000 | INHALATION_SPRAY | Freq: Every day | RESPIRATORY_TRACT | 5 refills | Status: DC

## 2023-06-20 NOTE — Addendum Note (Signed)
Addended by: Gay Filler T on: 06/20/2023 03:49 PM   Modules accepted: Orders

## 2023-06-20 NOTE — Assessment & Plan Note (Signed)
Hypoxic and hypercarbic respiratory failure-continue on oxygen to maintain O2 saturations greater than 88 to 90%.  Split-night sleep study is pending.

## 2023-06-20 NOTE — Progress Notes (Signed)
@Patient  ID: Kimberly Love, female    DOB: 1951/07/14, 72 y.o.   MRN: [id number]  Chief Complaint  Patient presents with   Follow-up    Referring provider: Benita Stabile, MD  HPI: 72 year old female former smoker seen for pulmonary critical care consult during hospitalization July 2024 for acute hypoxic and hypercarbic respiratory failure in the setting of parainfluenza pneumonia and asthmatic bronchitic exacerbation  TEST/EVENTS :   06/20/2023 Follow up : O2 RF and Asthmatic Bronchitis  Patient presents for a 36-month follow-up.  Patient was seen last visit for a posthospital follow-up.  She had a prolonged hospitalization July 2024 for acute hypoxic and hypercarbic respiratory failure in the setting of parainfluenza pneumonia and asthmatic bronchitic exacerbation.  CT chest showed patchy airspace opacities concerning for multifocal pneumonia.  Respiratory viral panel was positive for parainfluenza.  She did require intubation with mechanical ventilatory support she was treated with aggressive IV antibiotics, steroids and nebulized bronchodilators.  Hospital stay was complicated by A-fib with RVR.  Discharged on Cardizem and Eliquis.  She was weaned off of ventilator and discharged on oxygen.  She remains on oxygen 3 L at rest and 4 L with activity.  Last visit she was referred to the lung cancer CT screening program.  Set up for PFTs that was done on October 3 that showed severe restriction with FEV1 at 37% ratio at 75 and FVC at 37%, no significant bronchodilator response, diffusing capacity at 52%. And referred for a split-night sleep study which is pending.  Got POC last visit, is really helping her.  Walk test in office shows O2 sats 3l/m keep O2 sats >88-90%. (Improved from last office visit )  Has decreased cough and congestion.  Gets winded with minimal activities.  However energy level is slowly improving.     Allergies  Allergen Reactions   Hydrocodone Anaphylaxis     Patient states she was in a coma after given medication.    Oxycodone Anaphylaxis    Patient ended up in icu    Immunization History  Administered Date(s) Administered   Fluad Trivalent(High Dose 65+) 06/02/2023   PNEUMOCOCCAL CONJUGATE-20 04/25/2023    Past Medical History:  Diagnosis Date   Anxiety    Arthritis    OA- L knee    Chronic kidney disease    Complication of anesthesia    had seizure in 1973  while waking up, after childbirth - vaginal  birth    Depression    GERD (gastroesophageal reflux disease)    Hiatal hernia    Hypertension    dr Timothy Lasso   [facility]   PONV (postoperative nausea and vomiting)    Seizures (HCC)    x1- 1973- post childbirth     Tobacco History: Social History   Tobacco Use  Smoking Status Every Day   Current packs/day: 1.50   Average packs/day: 1.5 packs/day for 32.0 years (48.0 ttl pk-yrs)   Types: Cigarettes  Smokeless Tobacco Never  Tobacco Comments   Quit smoking cigarettes 2 months ago.  Still smokes an occasional cigar.  04/25/2023 hfb   Ready to quit: Not Answered Counseling given: Not Answered Tobacco comments: Quit smoking cigarettes 2 months ago.  Still smokes an occasional cigar.  04/25/2023 hfb   Outpatient Medications Prior to Visit  Medication Sig Dispense Refill   acetaminophen (TYLENOL) 325 MG tablet Take 325 mg by mouth every 6 (six) hours as needed.     cholecalciferol (VITAMIN D3) 25 MCG (1000 UNIT)  tablet Take 1,000 Units by mouth daily.     escitalopram (LEXAPRO) 20 MG tablet Take 20 mg by mouth daily.     furosemide (LASIX) 40 MG tablet Take 1 tab daily and an extra pill if you are having leg swelling, difficulty breathing or weight gain. 3060 tablet 0   multivitamin (CENTRUM) chewable tablet Chew 1 tablet by mouth daily.     Potassium Chloride ER 20 MEQ TBCR Take 1 tablet by mouth daily.     pravastatin (PRAVACHOL) 10 MG tablet Take 10 mg by mouth daily.     spironolactone (ALDACTONE) 25 MG tablet Take  0.5 tablets (12.5 mg total) by mouth daily. 45 tablet 2   apixaban (ELIQUIS) 5 MG TABS tablet Take 1 tablet (5 mg total) by mouth 2 (two) times daily. 60 tablet 0   diltiazem (CARDIZEM CD) 120 MG 24 hr capsule Take 1 capsule (120 mg total) by mouth daily. 30 capsule 0   No facility-administered medications prior to visit.     Review of Systems:   Constitutional:   No  weight loss, night sweats,  Fevers, chills, +fatigue, or  lassitude.  HEENT:   No headaches,  Difficulty swallowing,  Tooth/dental problems, or  Sore throat,                No sneezing, itching, ear ache, nasal congestion, post nasal drip,   CV:  No chest pain,  Orthopnea, PND, swelling in lower extremities, anasarca, dizziness, palpitations, syncope.   GI  No heartburn, indigestion, abdominal pain, nausea, vomiting, diarrhea, change in bowel habits, loss of appetite, bloody stools.   Resp:   No chest wall deformity  Skin: no rash or lesions.  GU: no dysuria, change in color of urine, no urgency or frequency.  No flank pain, no hematuria   MS:  No joint pain or swelling.  No decreased range of motion.  No back pain.    Physical Exam  BP 132/70 (BP Location: Left Arm, Patient Position: Sitting, Cuff Size: Large)   Pulse 81   Temp 97.6 F (36.4 C) (Oral)   Ht 5\' 6"  (1.676 m)   Wt 275 lb 12.8 oz (125.1 kg)   SpO2 96%   BMI 44.52 kg/m   GEN: A/Ox3; pleasant , NAD, well nourished    HEENT:  [place]/AT,  NOSE-clear, THROAT-clear, no lesions, no postnasal drip or exudate noted.   NECK:  Supple w/ fair ROM; no JVD; normal carotid impulses w/o bruits; no thyromegaly or nodules palpated; no lymphadenopathy.    RESP  Clear  P & A; w/o, wheezes/ rales/ or rhonchi. no accessory muscle use, no dullness to percussion  CARD:  RRR, no m/r/g, 1+peripheral edema, pulses intact, no cyanosis or clubbing.  GI:   Soft & nt; nml bowel sounds; no organomegaly or masses detected.   Musco: Warm bil, no deformities or joint swelling  noted.   Neuro: alert, no focal deficits noted.    Skin: Warm, no lesions or rashes    Lab Results:  CBC    Imaging: No results found.  Administration History     None          Latest Ref Rng & Units 06/01/2023    2:09 PM  PFT Results  FVC-Pre L 1.14   FVC-Predicted Pre % 34   FVC-Post L 1.23   FVC-Predicted Post % 37   Pre FEV1/FVC % % 77   Post FEV1/FCV % % 75   FEV1-Pre L 0.88  FEV1-Predicted Pre % 35   FEV1-Post L 0.93   DLCO uncorrected ml/min/mmHg 11.11   DLCO UNC% % 52   DLCO corrected ml/min/mmHg 11.11   DLCO COR %Predicted % 52   DLVA Predicted % 109   TLC L 4.10   TLC % Predicted % 74   RV % Predicted % 114     No results found for: "NITRICOXIDE"      Assessment & Plan:   Chronic respiratory failure with hypoxia (HCC) Hypoxic and hypercarbic respiratory failure-continue on oxygen to maintain O2 saturations greater than 88 to 90%.  Split-night sleep study is pending.  COPD (chronic obstructive pulmonary disease) (HCC) Severe COPD-with severe restriction on PFTs.  Suspect has a component of asthma along with COPD.  Will begin Trelegy for triple therapy maintenance regimen. Will check a high-resolution chest CT chest to rule out interstitial process  Plan  Patient Instructions  Begin Trelegy 1 puff daily, rinse after use Albuterol inhaler as needed Split night sleep study  Set up for HRCT Chest  Continue on Oxygen  Activity as tolerated.  Low salt diet , legs elevated Continue on Lasix and aldactone  Follow up with Dr. Wynona Neat in 3 months and As needed  (30 min slot)        CHF (congestive heart failure) (HCC) Appears euvolemic on exam.  Does not appear to be overtly fluid overloaded.  Continue on diuretics and follow-up with cardiology  Snoring Snoring, nocturnal hypoxemia, A-fib all concerning for underlying sleep apnea.  Split-night sleep study is pending     Rubye Oaks, NP 06/20/2023

## 2023-06-20 NOTE — Assessment & Plan Note (Signed)
Severe COPD-with severe restriction on PFTs.  Suspect has a component of asthma along with COPD.  Will begin Trelegy for triple therapy maintenance regimen. Will check a high-resolution chest CT chest to rule out interstitial process  Plan  Patient Instructions  Begin Trelegy 1 puff daily, rinse after use Albuterol inhaler as needed Split night sleep study  Set up for HRCT Chest  Continue on Oxygen  Activity as tolerated.  Low salt diet , legs elevated Continue on Lasix and aldactone  Follow up with Dr. Wynona Neat in 3 months and As needed  (30 min slot)

## 2023-06-20 NOTE — Assessment & Plan Note (Signed)
Snoring, nocturnal hypoxemia, A-fib all concerning for underlying sleep apnea.  Split-night sleep study is pending

## 2023-06-20 NOTE — Patient Instructions (Addendum)
Begin Trelegy 1 puff daily, rinse after use Albuterol inhaler as needed Split night sleep study  Set up for HRCT Chest  Continue on Oxygen 3l/m  Activity as tolerated.  Low salt diet , legs elevated Continue on Lasix and aldactone  Follow up with Dr. Wynona Neat in 3 months and As needed  (30 min slot)

## 2023-06-20 NOTE — Progress Notes (Signed)
Patient seen in the office today and instructed on use of Trelegy, and Albuterol.  Patient expressed understanding and demonstrated technique.

## 2023-06-20 NOTE — Assessment & Plan Note (Signed)
Appears euvolemic on exam.  Does not appear to be overtly fluid overloaded.  Continue on diuretics and follow-up with cardiology

## 2023-06-21 ENCOUNTER — Telehealth: Payer: Self-pay | Admitting: Primary Care

## 2023-06-21 MED ORDER — ALBUTEROL SULFATE HFA 108 (90 BASE) MCG/ACT IN AERS: 2.0000 | INHALATION_SPRAY | Freq: Four times a day (QID) | RESPIRATORY_TRACT | 2 refills | Status: DC | PRN

## 2023-06-21 NOTE — Telephone Encounter (Signed)
PT calling and she has not rec'd Albuterol:  (She got Trelegy)  I do not see it ordered. Pls call PT to advise  Pharm is [facility] in [place]  She was here yesterday. Sending back [facility].

## 2023-06-21 NOTE — Telephone Encounter (Signed)
Albuterol HFA sent to preferred pharmacy.  Left detailed message for patient.  Nothing further needed.

## 2023-07-03 NOTE — Progress Notes (Unsigned)
Cardiology Clinic Note   Patient Name: Kimberly Love Date of Encounter: 07/05/2023  Primary Care Provider:  Benita Stabile, MD Primary Cardiologist:  None  Patient Profile    Kimberly Love 72 year old female presents to the clinic today for follow-up evaluation of her CHF and paroxysmal atrial fibrillation.  Past Medical History    Past Medical History:  Diagnosis Date   Anxiety    Arthritis    OA- L knee    Chronic kidney disease    Complication of anesthesia    had seizure in 1973  while waking up, after childbirth - vaginal  birth    Depression    GERD (gastroesophageal reflux disease)    Hiatal hernia    Hypertension    dr Timothy Lasso   [facility]  [place]   PONV (postoperative nausea and vomiting)    Seizures (HCC)    x1- 1973- post childbirth    Past Surgical History:  Procedure Laterality Date   ABDOMINAL HYSTERECTOMY     partial    BREAST SURGERY     bio  2012   neg   CATARACT EXTRACTION W/PHACO Left 01/31/2020   Procedure: CATARACT EXTRACTION PHACO AND INTRAOCULAR LENS PLACEMENT (IOC);  Surgeon: Fabio Pierce, MD;  Location: [facility];  Service: Ophthalmology;  Laterality: Left;  CDE: 25.85   CATARACT EXTRACTION W/PHACO Right 02/14/2020   Procedure: CATARACT EXTRACTION PHACO AND INTRAOCULAR LENS PLACEMENT RIGHT EYE;  Surgeon: Fabio Pierce, MD;  Location: [facility];  Service: Ophthalmology;  Laterality: Right;  CDE: 15.45   CHOLECYSTECTOMY     COLONOSCOPY N/A 05/31/2018   Procedure: COLONOSCOPY;  Surgeon: Malissa Hippo, MD;  Location: [facility];  Service: Endoscopy;  Laterality: N/A;  830   COLONOSCOPY N/A 05/09/2019   Procedure: COLONOSCOPY;  Surgeon: Malissa Hippo, MD;  Location: [facility];  Service: Endoscopy;  Laterality: N/A;  930   ESOPHAGOGASTRODUODENOSCOPY N/A 02/05/2016   Procedure: ESOPHAGOGASTRODUODENOSCOPY (EGD);  Surgeon: Malissa Hippo, MD;  Location: [facility];  Service: Endoscopy;  Laterality: N/A;  12:25 - moved to 10:40 - Ann  notified pt   orthorscopy     rt knee   POLYPECTOMY  05/31/2018   Procedure: POLYPECTOMY;  Surgeon: Malissa Hippo, MD;  Location: [facility];  Service: Endoscopy;;  colon   POLYPECTOMY  05/09/2019   Procedure: POLYPECTOMY;  Surgeon: Malissa Hippo, MD;  Location: [facility];  Service: Endoscopy;;   TONSILLECTOMY     TOTAL KNEE ARTHROPLASTY  12/26/2011   Procedure: TOTAL KNEE ARTHROPLASTY;  Surgeon: Raymon Mutton, MD;  Location: [facility];  Service: Orthopedics;  Laterality: Right;  right total knee arthroplasty   TOTAL KNEE ARTHROPLASTY  07/02/2012   left knee   TOTAL KNEE ARTHROPLASTY  07/02/2012   Procedure: TOTAL KNEE ARTHROPLASTY;  Surgeon: Raymon Mutton, MD;  Location: [facility];  Service: Orthopedics;  Laterality: Left;  left total knee replacement   TUBAL LIGATION      Allergies  Allergies  Allergen Reactions   Hydrocodone Anaphylaxis    Patient states she was in a coma after given medication.    Oxycodone Anaphylaxis    Patient ended up in icu    History of Present Illness    Kimberly Love has a PMH of paroxysmal atrial fibrillation, CHF, acute respiratory failure, multifocal pneumonia, bronchitis, COPD, respiratory acidosis, difficulty walking, knee pain, is a DNR, smoking history, RBBB, and also has history of snoring.  She was  hospitalized 7/24 with PNA/influenza, COPD.  During that time she developed atrial fibrillation.  Her echocardiogram showed normal LV function and severe asymmetric LVH.  Cardiology was consulted but did not feel the same.  She was treated with anticoagulation and diltiazem.  She spontaneously converted to sinus rhythm.  She was diuresed.  Her home O2 was continued.  She was encouraged to continue a chest BiPAP.  She was seen in follow-up by pulmonology 8/24.  She described asthmatic bronchitis, chronic respiratory failure.  Her oxygen saturation on room air with walking went down to 83%.  Pulmonary function test was planned.  Lung cancer  screening and sleep study were also planned.  Chest x-ray showed trace pleural effusions.  She was seen in follow-up by Francis Dowse PA-C on 05/10/2023.  She denied chest pain.  Palpitations, and cardiac awareness.  She had been slowly increasing her physical activity at home.  She reported medication compliance and denies side effects.  She denied presyncope and syncope.  She denied dizzy spells.  Taking extra furosemide was reviewed as well as potassium dosing.  She was instructed to take potassium only 2 days a week.  She was taking Lasix daily and occasionally needed to take a second dose.  She presents to the clinic today for follow-up evaluation and states she occasionally feels like she is overdoing it.  During those times she will rest and monitor her oxygen saturation.  Her oxygen saturation quickly returns to high 80s low 90s.  She continues on 3 L nasal cannula.  We reviewed her lower extremity lymphedema.  She reports that lower extremity compression stockings were recommended.  We reviewed lower extremity lymphedema pumps.  She has been taking her Lasix 40 mg 2 times per day and has been taking supplemental potassium with this.  She also reports compliance with spironolactone.  I will order a limited echo for reevaluation of her LVH, ordered BMP, and plan follow-up in 4 to 6 months..  Today she denies chest pain, shortness of breath, lower extremity edema, fatigue, palpitations, melena, hematuria, hemoptysis, diaphoresis, weakness, presyncope, syncope, orthopnea, and PND.   Home Medications    Prior to Admission medications   Medication Sig Start Date End Date Taking? Authorizing Provider  acetaminophen (TYLENOL) 325 MG tablet Take 325 mg by mouth every 6 (six) hours as needed.    [provider]  albuterol (VENTOLIN HFA) 108 (90 Base) MCG/ACT inhaler Inhale 2 puffs into the lungs every 6 (six) hours as needed for wheezing or shortness of breath. 06/21/23   Parrett, Virgel Bouquet, NP   apixaban (ELIQUIS) 5 MG TABS tablet Take 1 tablet (5 mg total) by mouth 2 (two) times daily. 03/11/23 05/10/23  Lorin Glass, MD  cholecalciferol (VITAMIN D3) 25 MCG (1000 UNIT) tablet Take 1,000 Units by mouth daily.    [provider]  diltiazem (CARDIZEM CD) 120 MG 24 hr capsule Take 1 capsule (120 mg total) by mouth daily. 03/12/23 05/10/23  Dahal, Melina Schools, MD  escitalopram (LEXAPRO) 20 MG tablet Take 20 mg by mouth daily. 03/29/19   [provider]  Fluticasone-Umeclidin-Vilant (TRELEGY ELLIPTA) 100-62.5-25 MCG/ACT AEPB Inhale 1 puff into the lungs daily. 06/20/23   Parrett, Virgel Bouquet, NP  furosemide (LASIX) 40 MG tablet Take 1 tab daily and an extra pill if you are having leg swelling, difficulty breathing or weight gain. 03/11/23   Lorin Glass, MD  multivitamin (CENTRUM) chewable tablet Chew 1 tablet by mouth daily.    [provider]  Potassium  Chloride ER 20 MEQ TBCR Take 1 tablet by mouth daily. 04/07/23   [provider]  pravastatin (PRAVACHOL) 10 MG tablet Take 10 mg by mouth daily.    [provider]  spironolactone (ALDACTONE) 25 MG tablet Take 0.5 tablets (12.5 mg total) by mouth daily. 05/11/23 08/09/23  Sheilah Pigeon, PA-C    Family History    Family History  Problem Relation Age of Onset   Colon cancer Neg Hx    She indicated that her child is alive. She indicated that the status of her neg hx is unknown. She indicated that her other is alive.  Social History    Social History   Socioeconomic History   Marital status: Married    Spouse name: Not on file   Number of children: Not on file   Years of education: Not on file   Highest education level: Not on file  Occupational History   Not on file  Tobacco Use   Smoking status: Former    Current packs/day: 0.00    Average packs/day: 1.5 packs/day for 32.0 years (48.0 ttl pk-yrs)    Types: Cigarettes    Quit date: 02/24/2023    Years since quitting: 0.3   Smokeless tobacco:  Never  Vaping Use   Vaping status: Never Used  Substance and Sexual Activity   Alcohol use: No    Comment: "Rarely"   Drug use: No   Sexual activity: Not on file  Other Topics Concern   Not on file  Social History Narrative   Not on file   Social Determinants of Health   Financial Resource Strain: Not on file  Food Insecurity: No Food Insecurity (02/24/2023)   Hunger Vital Sign    Worried About Running Out of Food in the Last Year: Never true    Ran Out of Food in the Last Year: Never true  Transportation Needs: No Transportation Needs (02/24/2023)   PRAPARE - Administrator, Civil Service (Medical): No    Lack of Transportation (Non-Medical): No  Physical Activity: Not on file  Stress: Not on file  Social Connections: Not on file  Intimate Partner Violence: Not At Risk (02/27/2023)   Humiliation, Afraid, Rape, and Kick questionnaire    Fear of Current or Ex-Partner: No    Emotionally Abused: No    Physically Abused: No    Sexually Abused: No     Review of Systems    General:  No chills, fever, night sweats or weight changes.  Cardiovascular:  No chest pain, dyspnea on exertion, edema, orthopnea, palpitations, paroxysmal nocturnal dyspnea. Dermatological: No rash, lesions/masses Respiratory: No cough, dyspnea Urologic: No hematuria, dysuria Abdominal:   No nausea, vomiting, diarrhea, bright red blood per rectum, melena, or hematemesis Neurologic:  No visual changes, wkns, changes in mental status. All other systems reviewed and are otherwise negative except as noted above.  Physical Exam    VS:  BP 114/60 (BP Location: Left Arm, Patient Position: Sitting, Cuff Size: Large)   Pulse 64   Ht 5\' 6"  (1.676 m)   Wt 276 lb 9.6 oz (125.5 kg)   SpO2 96%   BMI 44.64 kg/m  , BMI Body mass index is 44.64 kg/m. GEN: Well nourished, well developed, in no acute distress. HEENT: normal. Neck: Supple, no JVD, carotid bruits, or masses. Cardiac: RRR, no murmurs, rubs,  or gallops. No clubbing, cyanosis, bilateral lower extremity edema (lymphedema).  Radials/DP/PT 2+ and equal bilaterally.  Respiratory:  Respirations regular and  unlabored, clear to auscultation bilaterally. GI: Soft, nontender, nondistended, BS + x 4. MS: no deformity or atrophy. Skin: warm and dry, no rash. Neuro:  Strength and sensation are intact. Psych: Normal affect.  Accessory Clinical Findings    Recent Labs: 03/02/2023: B Natriuretic Peptide 177.7 03/03/2023: TSH 4.876 03/06/2023: Magnesium 2.1 03/10/2023: Hemoglobin 11.8; Platelets 202 03/11/2023: ALT 48 05/10/2023: BUN 13; Creatinine, Ser 0.50; Potassium 4.7; Sodium 138   Recent Lipid Panel    Component Value Date/Time   TRIG 96 02/26/2023 0405         ECG personally reviewed by me today-   none today. Echocardiogram 02/25/2023  . Poor quality echo images despite Definity administration. Challenging  study in the setting of tachycardia. Left ventricular ejection fraction is  grossly normal. There is severe asymmetric left ventricular hypertrophy of  the septal segment.  Indeterminate diastolic filling due to E-A fusion. Consider Limited Echo  with contrast when tachycardia is resolved.   2. Right ventricular systolic function was not well visualized. The right  ventricular size is normal. Tricuspid regurgitation signal is inadequate  for assessing PA pressure.   3. The mitral valve was not well visualized. No evidence of mitral valve  regurgitation. No evidence of mitral stenosis.   4. The aortic valve was not well visualized. Aortic valve regurgitation  is not visualized. No aortic stenosis is present.   5. The inferior vena cava is dilated in size with <50% respiratory  variability, suggesting right atrial pressure of 15 mmHg.   Comparison(s): No prior Echocardiogram.       Assessment & Plan   1.  CHF, asymmetric LVH-breathing stable.  Weight stable.  Echocardiogram 02/25/2023 was poor quality due to  tachycardia.  EF was noted to be normal.  Asymmetric LVH in the septal segment was noted.  Limited echo was recommended.  No valvular abnormalities were noted Heart healthy low-sodium diet Continue spironolactone, furosemide, potassium Increase physical activity as tolerated Daily weights Repeat limited echo Ordered BMP  Paroxysmal atrial fibrillation-heart rate today 64.  CHA2DS2-VASc score 4.  Reports compliance with apixaban.  Denies bleeding issues. Continue anticoagulation Avoid triggers caffeine, chocolate, EtOH, dehydration etc.  OSA-sleep study has been ordered per pt BiPAP compliance strongly encouraged Elevate head of bed Avoid supine sleeping Weight loss recommended  Essential hypertension-BP today 114/60. Maintain blood pressure log Continued diltiazem, spironolactone Heart healthy low-sodium diet  Tachycardia-heart rate today 64 .  Denies episodes of palpitations, accelerated or irregular heartbeat. Maintain p.o. hydration No plans for cardiac event monitor at this time  Lower extremity lymphedema-recommended lymphedema pumps. Follow-up with PCP   Disposition: Follow-up with Dr. Jens Som or me in 4 to 6 months.   Thomasene Ripple. Sylvan Lahm NP-C     07/05/2023, 3:47 PM [facility] [address] Office [phone] Fax [phone]    I spent 15 minutes examining this patient, reviewing medications, and using patient centered shared decision making involving her cardiac care.   I spent greater than 20 minutes reviewing her past medical history,  medications, and prior cardiac tests.

## 2023-07-05 ENCOUNTER — Encounter: Payer: Self-pay | Admitting: General Practice

## 2023-07-05 ENCOUNTER — Other Ambulatory Visit: Payer: Self-pay | Admitting: General Practice

## 2023-07-05 ENCOUNTER — Ambulatory Visit: Payer: Managed Care, Other (non HMO) | Attending: General Practice | Admitting: General Practice

## 2023-07-05 VITALS — BP 114/60 | HR 64 | Ht 66.0 in | Wt 276.6 lb

## 2023-07-05 DIAGNOSIS — I10 Essential (primary) hypertension: Secondary | ICD-10-CM | POA: Diagnosis not present

## 2023-07-05 DIAGNOSIS — I480 Paroxysmal atrial fibrillation: Secondary | ICD-10-CM

## 2023-07-05 DIAGNOSIS — I5032 Chronic diastolic (congestive) heart failure: Secondary | ICD-10-CM | POA: Diagnosis not present

## 2023-07-05 DIAGNOSIS — I517 Cardiomegaly: Secondary | ICD-10-CM

## 2023-07-05 DIAGNOSIS — R000 Tachycardia, unspecified: Secondary | ICD-10-CM

## 2023-07-05 DIAGNOSIS — G4733 Obstructive sleep apnea (adult) (pediatric): Secondary | ICD-10-CM

## 2023-07-05 MED ORDER — DILTIAZEM HCL ER COATED BEADS 120 MG PO CP24: 120.0000 mg | ORAL_CAPSULE | Freq: Every day | ORAL | 3 refills | Status: AC

## 2023-07-05 MED ORDER — PRAVASTATIN SODIUM 10 MG PO TABS: 10.0000 mg | ORAL_TABLET | Freq: Two times a day (BID) | ORAL | 3 refills | Status: AC

## 2023-07-05 NOTE — Patient Instructions (Addendum)
Medication Instructions:  The current medical regimen is effective;  continue present plan and medications as directed. Please refer to the Current Medication list given to you today.  *If you need a refill on your cardiac medications before your next appointment, please call your pharmacy*  Lab Work: BMET TODAY If you have labs (blood work) drawn today and your tests are completely normal, you will receive your results only by: MyChart Message (if you have MyChart) OR  A paper copy in the mail If you have any lab test that is abnormal or we need to change your treatment, we will call you to review the results.  Testing/Procedures: Your physician has requested that you have a LIMITED echocardiogram. Echocardiography is a painless test that uses sound waves to create images of your heart. It provides your doctor with information about the size and shape of your heart and how well your heart's chambers and valves are working. This procedure takes approximately one hour. There are no restrictions for this procedure. Please do NOT wear cologne, perfume, aftershave, or lotions (deodorant is allowed). Please arrive 15 minutes prior to your appointment time.  Please note: We ask at that you not bring children with you during ultrasound (echo/ vascular) testing. Due to room size and safety concerns, children are not allowed in the ultrasound rooms during exams. Our front office staff cannot provide observation of children in our lobby area while testing is being conducted. An adult accompanying a patient to their appointment will only be allowed in the ultrasound room at the discretion of the ultrasound technician under special circumstances. We apologize for any inconvenience.   Other Instructions DISCUSS LYMPHEDEMA WITH DR HOLLA INCREASE PHYSICAL ACTIVITY-AS TOLERATED PLEASE READ AND FOLLOW ATTACHED  SALTY 6  Follow-Up: At [facility], you and your health needs are our priority.  As part  of our continuing mission to provide you with exceptional heart care, we have created designated Provider Care Teams.  These Care Teams include your primary Cardiologist (physician) and Advanced Practice Providers (APPs -  Physician Assistants and Nurse Practitioners) who all work together to provide you with the care you need, when you need it.  We recommend signing up for the patient portal called "MyChart".  Sign up information is provided on this After Visit Summary.  MyChart is used to connect with patients for Virtual Visits (Telemedicine).  Patients are able to view lab/test results, encounter notes, upcoming appointments, etc.  Non-urgent messages can be sent to your provider as well.   To learn more about what you can do with MyChart, go to [email or web address].    Your next appointment:   6 month(s)  Provider:   Olga Millers, MD  or Edd Fabian, FNP

## 2023-07-06 LAB — BASIC METABOLIC PANEL
BUN/Creatinine Ratio: 19 (ref 12–28)
BUN: 15 mg/dL (ref 8–27)
CO2: 25 mmol/L (ref 20–29)
Calcium: 9.9 mg/dL (ref 8.7–10.3)
Chloride: 94 mmol/L — ABNORMAL LOW (ref 96–106)
Creatinine, Ser: 0.78 mg/dL (ref 0.57–1.00)
Glucose: 114 mg/dL — ABNORMAL HIGH (ref 70–99)
Potassium: 4.6 mmol/L (ref 3.5–5.2)
Sodium: 140 mmol/L (ref 134–144)
eGFR: 81 mL/min/{1.73_m2} (ref >59)

## 2023-07-15 ENCOUNTER — Ambulatory Visit (HOSPITAL_COMMUNITY): Payer: Managed Care, Other (non HMO)

## 2023-07-15 ENCOUNTER — Encounter (HOSPITAL_COMMUNITY): Payer: Self-pay

## 2023-07-18 ENCOUNTER — Ambulatory Visit
Admission: RE | Admit: 2023-07-18 | Discharge: 2023-07-18 | Disposition: A | Discharge status: Home / Self Care | Payer: Managed Care, Other (non HMO) | Source: Ambulatory Visit | Attending: Adult Health | Admitting: Adult Health

## 2023-07-18 ENCOUNTER — Ambulatory Visit (HOSPITAL_COMMUNITY): Admission: RE | Admit: 2023-07-18 | Payer: Managed Care, Other (non HMO) | Source: Ambulatory Visit

## 2023-07-18 ENCOUNTER — Other Ambulatory Visit: Payer: Managed Care, Other (non HMO)

## 2023-07-18 DIAGNOSIS — R9389 Abnormal findings on diagnostic imaging of other specified body structures: Secondary | ICD-10-CM

## 2023-07-18 DIAGNOSIS — J849 Interstitial pulmonary disease, unspecified: Secondary | ICD-10-CM

## 2023-07-25 ENCOUNTER — Ambulatory Visit (HOSPITAL_COMMUNITY)
Admission: RE | Admit: 2023-07-25 | Discharge: 2023-07-25 | Disposition: A | Discharge status: Home / Self Care | Payer: Managed Care, Other (non HMO) | Source: Ambulatory Visit | Attending: General Practice | Admitting: General Practice

## 2023-07-25 DIAGNOSIS — I517 Cardiomegaly: Secondary | ICD-10-CM | POA: Diagnosis not present

## 2023-07-25 LAB — ECHOCARDIOGRAM LIMITED: S' Lateral: 2.9 cm

## 2023-07-25 MED ORDER — PERFLUTREN LIPID MICROSPHERE
1.0000 mL | INTRAVENOUS | Status: AC | PRN
Start: 2023-07-25 — End: 2023-07-25
  Administered 2023-07-25: 5 mL via INTRAVENOUS

## 2023-07-25 NOTE — Progress Notes (Signed)
*  PRELIMINARY RESULTS* Echocardiogram Limited 2-D Echocardiogram  has been performed with Definity.  Stacey Drain 07/25/2023, 11:34 AM

## 2023-08-01 ENCOUNTER — Ambulatory Visit: Payer: 59 | Admitting: Adult Health

## 2023-08-01 DIAGNOSIS — J449 Chronic obstructive pulmonary disease, unspecified: Secondary | ICD-10-CM

## 2023-08-01 NOTE — Telephone Encounter (Signed)
Tammy please advise °

## 2023-08-02 NOTE — Telephone Encounter (Signed)
Need to wait for final radiology report from Radiologist   Refer to Pulmonary Rehab (Dr. Wynona Neat ) , dx copd

## 2023-08-02 NOTE — Telephone Encounter (Signed)
Order placed and message sent to patient to make her aware.

## 2023-08-03 MED ORDER — TRELEGY ELLIPTA 100-62.5-25 MCG/ACT IN AEPB: 1.0000 | INHALATION_SPRAY | Freq: Every day | RESPIRATORY_TRACT | 3 refills | Status: AC

## 2023-08-18 ENCOUNTER — Encounter: Payer: Self-pay | Admitting: *Deleted

## 2023-08-18 NOTE — Progress Notes (Signed)
Called and spoke with patient, provided results per Rexene Edison NP.  She verbalized understanding. Nothing further needed.

## 2023-09-12 LAB — LAB REPORT - SCANNED
A1c: 5.8
EGFR: 76

## 2023-09-13 ENCOUNTER — Ambulatory Visit: Payer: Managed Care, Other (non HMO) | Admitting: Pulmonary Disease

## 2023-09-13 ENCOUNTER — Encounter: Payer: Self-pay | Admitting: Pulmonary Disease

## 2023-09-13 VITALS — BP 132/60 | HR 57 | Temp 98.0°F | Ht 66.0 in | Wt 289.6 lb

## 2023-09-13 DIAGNOSIS — J449 Chronic obstructive pulmonary disease, unspecified: Secondary | ICD-10-CM | POA: Diagnosis not present

## 2023-09-13 DIAGNOSIS — I509 Heart failure, unspecified: Secondary | ICD-10-CM | POA: Diagnosis not present

## 2023-09-13 DIAGNOSIS — J9611 Chronic respiratory failure with hypoxia: Secondary | ICD-10-CM | POA: Diagnosis not present

## 2023-09-13 DIAGNOSIS — I4891 Unspecified atrial fibrillation: Secondary | ICD-10-CM | POA: Diagnosis not present

## 2023-09-13 NOTE — Progress Notes (Signed)
 Kimberly Love    [id number]    06/17/1951  Primary Care Physician:Hall, Lethia Raveling, MD  Referring Physician: Omie Bickers, MD [address] [place],  [place] [address]  Chief complaint:   Patient being seen for shortness of breath  HPI:  Was recently hospitalized for acute hypoxemic respiratory failure with hypercapnic respiratory failure Underlying obstructive lung disease -Did have parainfluenza infection  Smoked significantly in the past about 2 packs a day for over 40 years Got sick in June and was hospitalized  Was discharged on home oxygen  She has been feeling better  Still short of breath on exertion but feeling better  Patchy airspace disease seen on previous CT improved on the follow-up CT Did have atrial fibrillation while she was hospitalized, still on anticoagulation  Remains on oxygen supplementation at rest and with activity  PFT consistent with severe obstructive disease  Did have a sleep study scheduled, still pending  Outpatient Encounter Medications as of 09/13/2023  Medication Sig   acetaminophen  (TYLENOL ) 325 MG tablet Take 325 mg by mouth every 6 (six) hours as needed.   albuterol  (VENTOLIN  HFA) 108 (90 Base) MCG/ACT inhaler Inhale 2 puffs into the lungs every 6 (six) hours as needed for wheezing or shortness of breath.   cholecalciferol (VITAMIN D3) 25 MCG (1000 UNIT) tablet Take 1,000 Units by mouth daily.   clotrimazole-betamethasone (LOTRISONE) cream Apply 1 Application topically 2 (two) times daily as needed.   escitalopram  (LEXAPRO ) 20 MG tablet Take 20 mg by mouth daily.   Fluticasone-Umeclidin-Vilant (TRELEGY ELLIPTA ) 100-62.5-25 MCG/ACT AEPB Inhale 1 puff into the lungs daily.   furosemide  (LASIX ) 40 MG tablet Take 1 tab daily and an extra pill if you are having leg swelling, difficulty breathing or weight gain.   multivitamin (CENTRUM) chewable tablet Chew 1 tablet by mouth daily.   Potassium Chloride  ER 20 MEQ TBCR Take 1  tablet by mouth daily.   pravastatin  (PRAVACHOL ) 10 MG tablet Take 1 tablet (10 mg total) by mouth 2 (two) times daily.   apixaban  (ELIQUIS ) 5 MG TABS tablet Take 1 tablet (5 mg total) by mouth 2 (two) times daily.   diltiazem  (CARDIZEM  CD) 120 MG 24 hr capsule Take 1 capsule (120 mg total) by mouth daily.   spironolactone  (ALDACTONE ) 25 MG tablet Take 0.5 tablets (12.5 mg total) by mouth daily.   No facility-administered encounter medications on file as of 09/13/2023.    Allergies as of 09/13/2023 - Review Complete 09/13/2023  Allergen Reaction Noted   Hydrocodone  Anaphylaxis 06/20/2023   Oxycodone  Anaphylaxis 06/25/2012    Past Medical History:  Diagnosis Date   Anxiety    Arthritis    OA- L knee    Chronic kidney disease    Complication of anesthesia    had seizure in 1973  while waking up, after childbirth - vaginal  birth    Depression    GERD (gastroesophageal reflux disease)    Hiatal hernia    Hypertension    dr Coral Der   [facility]  [place]   PONV (postoperative nausea and vomiting)    Seizures (HCC)    x1- 1973- post childbirth     Past Surgical History:  Procedure Laterality Date   ABDOMINAL HYSTERECTOMY     partial    BREAST SURGERY     bio  2012   neg   CATARACT EXTRACTION W/PHACO Left 01/31/2020   Procedure: CATARACT EXTRACTION PHACO AND INTRAOCULAR LENS PLACEMENT (  IOC);  Surgeon: Tarri Farm, MD;  Location: [facility];  Service: Ophthalmology;  Laterality: Left;  CDE: 25.85   CATARACT EXTRACTION W/PHACO Right 02/14/2020   Procedure: CATARACT EXTRACTION PHACO AND INTRAOCULAR LENS PLACEMENT RIGHT EYE;  Surgeon: Tarri Farm, MD;  Location: [facility];  Service: Ophthalmology;  Laterality: Right;  CDE: 15.45   CHOLECYSTECTOMY     COLONOSCOPY N/A 05/31/2018   Procedure: COLONOSCOPY;  Surgeon: Ruby Corporal, MD;  Location: [facility];  Service: Endoscopy;  Laterality: N/A;  830   COLONOSCOPY N/A 05/09/2019   Procedure: COLONOSCOPY;  Surgeon: Ruby Corporal, MD;   Location: [facility];  Service: Endoscopy;  Laterality: N/A;  930   ESOPHAGOGASTRODUODENOSCOPY N/A 02/05/2016   Procedure: ESOPHAGOGASTRODUODENOSCOPY (EGD);  Surgeon: Ruby Corporal, MD;  [facility];  Service: Endoscopy;  Laterality: N/A;  12:25 - moved to 10:40 - Ann notified pt   orthorscopy     rt knee   POLYPECTOMY  05/31/2018   Procedure: POLYPECTOMY;  Surgeon: Ruby Corporal, MD;  [facility];  Service: Endoscopy;;  colon   POLYPECTOMY  05/09/2019   Procedure: POLYPECTOMY;  Surgeon: Ruby Corporal, MD;  [facility]: [facility];  Service: Endoscopy;;   TONSILLECTOMY     TOTAL KNEE ARTHROPLASTY  12/26/2011   Procedure: TOTAL KNEE ARTHROPLASTY;  Surgeon: Florencia Hunter, MD;  Location: [facility];  Service: Orthopedics;  Laterality: Right;  right total knee arthroplasty   TOTAL KNEE ARTHROPLASTY  07/02/2012   left knee   TOTAL KNEE ARTHROPLASTY  07/02/2012   Procedure: TOTAL KNEE ARTHROPLASTY;  Surgeon: Florencia Hunter, MD;  Location: [facility];  Service: Orthopedics;  Laterality: Left;  left total knee replacement   TUBAL LIGATION      Family History  Problem Relation Age of Onset   Colon cancer Neg Hx     Social History   Socioeconomic History   Marital status: Married    Spouse name: Not on file   Number of children: Not on file   Years of education: Not on file   Highest education level: Not on file  Occupational History   Not on file  Tobacco Use   Smoking status: Former    Current packs/day: 0.00    Average packs/day: 1.5 packs/day for 32.0 years (48.0 ttl pk-yrs)    Types: Cigarettes    Quit date: 02/24/2023    Years since quitting: 0.5   Smokeless tobacco: Never  Vaping Use   Vaping status: Never Used  Substance and Sexual Activity   Alcohol use: No    Comment: "Rarely"   Drug use: No   Sexual activity: Not on file  Other Topics Concern   Not on file  Social History Narrative   Not on file   Social Drivers of Health   Financial  Resource Strain: Not on file  Food Insecurity: No Food Insecurity (02/24/2023)   Hunger Vital Sign    Worried About Running Out of Food in the Last Year: Never true    Ran Out of Food in the Last Year: Never true  Transportation Needs: No Transportation Needs (02/24/2023)   PRAPARE - Administrator, Civil Service (Medical): No    Lack of Transportation (Non-Medical): No  Physical Activity: Not on file  Stress: Not on file  Social Connections: Not on file  Intimate Partner Violence: Not At Risk (02/27/2023)   Humiliation, Afraid, Rape, and Kick questionnaire    Fear of Current  or Ex-Partner: No    Emotionally Abused: No    Physically Abused: No    Sexually Abused: No    Review of Systems  Constitutional:  Positive for fatigue.  Respiratory:  Positive for shortness of breath.   Psychiatric/Behavioral:  Positive for sleep disturbance.     Vitals:   09/13/23 1331  BP: 132/60  Pulse: (!) 57  Temp: 98 F (36.7 C)  SpO2: 93%     Physical Exam Constitutional:      Appearance: She is obese.  HENT:     Head: Normocephalic.     Mouth/Throat:     Mouth: Mucous membranes are moist.  Eyes:     General: No scleral icterus. Cardiovascular:     Rate and Rhythm: Normal rate and regular rhythm.     Heart sounds: No murmur heard.    No friction rub.  Pulmonary:     Effort: No respiratory distress.     Breath sounds: No stridor. No wheezing or rhonchi.  Musculoskeletal:     Cervical back: No rigidity or tenderness.  Neurological:     Mental Status: She is alert.  Psychiatric:        Mood and Affect: Mood normal.     Data Reviewed: Multiple CT scans reviewed by myself showing improvement in infiltrative and nodular changes  PFT reviewed showing severe obstructive disease, mild restriction, moderately reduced diffusing capacity  Assessment:  Chronic hypoxemic respiratory failure  Class III obesity  Atrial fibrillation  Chronic obstructive pulmonary  disease  Concern for sleep disordered breathing, snoring, nocturnal hypoxemia, history of atrial fibrillation -Sleep study is pending  History of congestive heart failure -She is stable at present  Plan/Recommendations: Continue oxygen supplementation around-the-clock Continue nocturnal oxygen supplementation  Follow-up on sleep study  Continue bronchodilator treatment Continue Trelegy, albuterol  use as needed  Wean oxygen as tolerated by monitoring saturations on a regular basis  Weight loss efforts  Graded activities as tolerated  Follow-up in about 3 months  Myer Artis MD [facility] 09/13/2023, 5:01 PM  CC: Omie Bickers, MD

## 2023-09-13 NOTE — Patient Instructions (Addendum)
 Continue using Trelegy daily  Use rescue albuterol  as needed  Graded activities as tolerated  Make sure you are exercising regularly  Continue using your oxygen  Check your pulse ox periodically to ascertain how much oxygen you need  You need to keep the number above 90% most times  We will see you back in about 3 months  Call us  with significant concerns  Your breathing will get better, it takes some commitment, it takes exercising regularly as best as you can  We can make a referral to pulmonary rehab if you would like

## 2023-12-14 ENCOUNTER — Telehealth: Payer: Self-pay | Admitting: *Deleted

## 2023-12-14 ENCOUNTER — Other Ambulatory Visit: Payer: Self-pay | Admitting: *Deleted

## 2023-12-14 DIAGNOSIS — Z87891 Personal history of nicotine dependence: Secondary | ICD-10-CM

## 2023-12-14 DIAGNOSIS — Z122 Encounter for screening for malignant neoplasm of respiratory organs: Secondary | ICD-10-CM

## 2023-12-14 NOTE — Telephone Encounter (Signed)
 Lung Cancer Screening Narrative/Criteria Questionnaire (Cigarette Smokers Only- No Cigars/Pipes/vapes)   Kimberly Love   SDMV:03/12/24 1:30- [name]                                           1951-01-29              LDCT: 03/13/24 4:00- [facility]    72 y.o.   Phone: [phone]  Lung Screening Narrative (confirm age 71-77 yrs Medicare / 50-80 yrs Private pay insurance)   Insurance information:[employer]/ MCR A   Referring Provider:[place]   This screening involves an initial phone call with a team member from our program. It is called a shared decision making visit. The initial meeting is required by insurance and Medicare to make sure you understand the program. This appointment takes about 15-20 minutes to complete. The CT scan will completed at a separate date/time. This scan takes about 5-10 minutes to complete and you may eat and drink before and after the scan.  Criteria questions for Lung Cancer Screening:   Are you a current or former smoker? Former Age began smoking: 30   If you are a former smoker, what year did you quit smoking? (within 15 yrs)   To calculate your smoking history, I need an accurate estimate of how many packs of cigarettes you smoked per day and for how many years. (Not just the number of PPD you are now smoking)   Years smoking 42 x Packs per day 2 = Pack years 29   (at least 20 pack yrs)   (Make sure they understand that we need to know how much they have smoked in the past, not just the number of PPD they are smoking now)  Do you have a personal history of cancer?  No    Do you have a family history of cancer? No  Are you coughing up blood?  No  Have you had unexplained weight loss of 15 lbs or more in the last 6 months? No  It looks like you meet all criteria.     Additional information: N/A

## 2023-12-20 ENCOUNTER — Ambulatory Visit: Payer: Managed Care, Other (non HMO) | Admitting: Pulmonary Disease

## 2023-12-24 ENCOUNTER — Other Ambulatory Visit: Payer: Self-pay | Admitting: Physician Assistant

## 2024-01-11 ENCOUNTER — Other Ambulatory Visit: Payer: Self-pay | Admitting: Adult Health

## 2024-02-21 ENCOUNTER — Telehealth: Payer: Self-pay | Admitting: Cardiology

## 2024-02-21 NOTE — Telephone Encounter (Signed)
 Patient stated she is on oxygen and is concerned about traveling in the heat.  Patient wants to have a virtual visit instead of an in-office visit with DOROTHA Beauvais FNP tomorrow 6/26.

## 2024-02-21 NOTE — Telephone Encounter (Signed)
 Called and spoke to pt. Full name and DOB verified.   Pt aware of vitals and weight needed before appointment. Pt verbalized understanding and was thankful for call.

## 2024-02-21 NOTE — Progress Notes (Unsigned)
 Cardiology Clinic Note   Patient Name: Kimberly Love Date of Encounter: 02/21/2024  Primary Care Provider:  Shona Norleen PEDLAR, MD Primary Cardiologist:  Redell Shallow, MD  Patient Profile    Kimberly Love 73 year old female presents to the clinic today for follow-up evaluation of her CHF and paroxysmal atrial fibrillation.  Past Medical History    Past Medical History:  Diagnosis Date   Anxiety    Arthritis    OA- L knee    Chronic kidney disease    Complication of anesthesia    had seizure in 1973  while waking up, after childbirth - vaginal  birth    Depression    GERD (gastroesophageal reflux disease)    Hiatal hernia    Hypertension    dr christyne   [facility]  [place]   PONV (postoperative nausea and vomiting)    Seizures (HCC)    x1- 1973- post childbirth    Past Surgical History:  Procedure Laterality Date   ABDOMINAL HYSTERECTOMY     partial    BREAST SURGERY     bio  2012   neg   CATARACT EXTRACTION W/PHACO Left 01/31/2020   Procedure: CATARACT EXTRACTION PHACO AND INTRAOCULAR LENS PLACEMENT (IOC);  Surgeon: Harrie Agent, MD;  Location: [facility];  Service: Ophthalmology;  Laterality: Left;  CDE: 25.85   CATARACT EXTRACTION W/PHACO Right 02/14/2020   Procedure: CATARACT EXTRACTION PHACO AND INTRAOCULAR LENS PLACEMENT RIGHT EYE;  Surgeon: Harrie Agent, MD;  Location: [facility];  Service: Ophthalmology;  Laterality: Right;  CDE: 15.45   CHOLECYSTECTOMY     COLONOSCOPY N/A 05/31/2018   Procedure: COLONOSCOPY;  Surgeon: Golda Claudis PENNER, MD;  Location: [facility];  Service: Endoscopy;  Laterality: N/A;  830   COLONOSCOPY N/A 05/09/2019   Procedure: COLONOSCOPY;  Surgeon: Golda Claudis PENNER, MD;  Location: [facility];  Service: Endoscopy;  Laterality: N/A;  930   ESOPHAGOGASTRODUODENOSCOPY N/A 02/05/2016   Procedure: ESOPHAGOGASTRODUODENOSCOPY (EGD);  Surgeon: Claudis PENNER Golda, MD;  [facility]: [facility];  Service: Endoscopy;  Laterality: N/A;  12:25 - moved to  10:40 - Ann notified pt   orthorscopy     rt knee   POLYPECTOMY  05/31/2018   Procedure: POLYPECTOMY;  Surgeon: Golda Claudis PENNER, MD;  [facility]: [facility];  Service: Endoscopy;;  colon   POLYPECTOMY  05/09/2019   Procedure: POLYPECTOMY;  Surgeon: Golda Claudis PENNER, MD;  Location: [facility];  Service: Endoscopy;;   TONSILLECTOMY     TOTAL KNEE ARTHROPLASTY  12/26/2011   Procedure: TOTAL KNEE ARTHROPLASTY;  Surgeon: Garnette JONETTA Raman, MD;  Location: [facility];  Service: Orthopedics;  Laterality: Right;  right total knee arthroplasty   TOTAL KNEE ARTHROPLASTY  07/02/2012   left knee   TOTAL KNEE ARTHROPLASTY  07/02/2012   Procedure: TOTAL KNEE ARTHROPLASTY;  Surgeon: Garnette JONETTA Raman, MD;  Location: [facility];  Service: Orthopedics;  Laterality: Left;  left total knee replacement   TUBAL LIGATION      Allergies  Allergies  Allergen Reactions   Hydrocodone  Anaphylaxis    Patient states she was in a coma after given medication.    Oxycodone  Anaphylaxis    Patient ended up in icu    History of Present Illness    Kimberly Love has a PMH of paroxysmal atrial fibrillation, CHF, acute respiratory failure, multifocal pneumonia, bronchitis, COPD, respiratory acidosis, difficulty walking, knee pain, is a DNR, smoking history, RBBB, and also has history of snoring.  She was hospitalized 7/24 with PNA/influenza, COPD.  During that time she developed atrial fibrillation.  Her echocardiogram showed normal LV function and severe asymmetric LVH.  Cardiology was consulted but did not feel the same.  She was treated with anticoagulation and diltiazem .  She spontaneously converted to sinus rhythm.  She was diuresed.  Her home O2 was continued.  She was encouraged to continue a chest BiPAP.    She was seen in follow-up by pulmonology 8/24.  She described asthmatic bronchitis, chronic respiratory failure.  Her oxygen saturation on room air with walking went down to 83%.  Pulmonary function test was planned.   Lung cancer screening and sleep study were also planned.  Chest x-ray showed trace pleural effusions.  She was seen in follow-up by Charlies Arthur PA-C on 05/10/2023.  She denied chest pain.  Palpitations, and cardiac awareness.  She had been slowly increasing her physical activity at home.  She reported medication compliance and denies side effects.  She denied presyncope and syncope.  She denied dizzy spells.  Taking extra furosemide  was reviewed as well as potassium dosing.  She was instructed to take potassium only 2 days a week.  She was taking Lasix  daily and occasionally needed to take a second dose.     She presented to the clinic 07/05/23 for follow-up evaluation and stated she occasionally felt like she was overdoing it.  During those times she would  rest and monitor her oxygen saturation.  Her oxygen saturation quickly returned to high 80s low 90s.  She continued on 3 L nasal cannula.  We reviewed her lower extremity lymphedema.  She reported that lower extremity compression stockings were recommended.  We reviewed lower extremity lymphedema pumps.  She had been taking her Lasix  40 mg 2 times per day and had been taking supplemental potassium with this.  She also reported compliance with spironolactone .  I ordered a limited echo for reevaluation of her LVH, ordered BMP, and plan follow-up in 4 to 6 months..  Echocardiogram 07/25/2023 showed an LVEF of 60 to 65%, no regional wall motion abnormalities, mild-moderate asymmetric LVH.  Medical management was continued.  She presents to the clinic today for follow-up evaluation and states***.  Today she denies chest pain, shortness of breath, lower extremity edema, fatigue, palpitations, melena, hematuria, hemoptysis, diaphoresis, weakness, presyncope, syncope, orthopnea, and PND.   Home Medications    Prior to Admission medications   Medication Sig Start Date End Date Taking? Authorizing Provider  acetaminophen  (TYLENOL ) 325 MG tablet Take 325  mg by mouth every 6 (six) hours as needed.    [provider]  albuterol  (VENTOLIN  HFA) 108 (90 Base) MCG/ACT inhaler Inhale 2 puffs into the lungs every 6 (six) hours as needed for wheezing or shortness of breath. 06/21/23   Parrett, Madelin RAMAN, NP  apixaban  (ELIQUIS ) 5 MG TABS tablet Take 1 tablet (5 mg total) by mouth 2 (two) times daily. 03/11/23 05/10/23  Arlice Reichert, MD  cholecalciferol (VITAMIN D3) 25 MCG (1000 UNIT) tablet Take 1,000 Units by mouth daily.    [provider]  diltiazem  (CARDIZEM  CD) 120 MG 24 hr capsule Take 1 capsule (120 mg total) by mouth daily. 03/12/23 05/10/23  Arlice Reichert, MD  escitalopram  (LEXAPRO ) 20 MG tablet Take 20 mg by mouth daily. 03/29/19   [provider]  Fluticasone-Umeclidin-Vilant (TRELEGY ELLIPTA ) 100-62.5-25 MCG/ACT AEPB Inhale 1 puff into the lungs daily. 06/20/23   Parrett, Madelin RAMAN, NP  furosemide  (LASIX ) 40 MG tablet Take 1  tab daily and an extra pill if you are having leg swelling, difficulty breathing or weight gain. 03/11/23   Arlice Reichert, MD  multivitamin (CENTRUM) chewable tablet Chew 1 tablet by mouth daily.    [provider]  Potassium Chloride  ER 20 MEQ TBCR Take 1 tablet by mouth daily. 04/07/23   [provider]  pravastatin  (PRAVACHOL ) 10 MG tablet Take 10 mg by mouth daily.    [provider]  spironolactone  (ALDACTONE ) 25 MG tablet Take 0.5 tablets (12.5 mg total) by mouth daily. 05/11/23 08/09/23  Leverne Charlies Helling, PA-C    Family History    Family History  Problem Relation Age of Onset   Colon cancer Neg Hx    She indicated that her child is alive. She indicated that the status of her neg hx is unknown. She indicated that her other is alive.  Social History    Social History   Socioeconomic History   Marital status: Married    Spouse name: Not on file   Number of children: Not on file   Years of education: Not on file   Highest education level: Not on file  Occupational  History   Not on file  Tobacco Use   Smoking status: Former    Current packs/day: 0.00    Average packs/day: 1.5 packs/day for 32.0 years (48.0 ttl pk-yrs)    Types: Cigarettes    Quit date: 02/24/2023    Years since quitting: 0.9   Smokeless tobacco: Never  Vaping Use   Vaping status: Never Used  Substance and Sexual Activity   Alcohol use: No    Comment: Rarely   Drug use: No   Sexual activity: Not on file  Other Topics Concern   Not on file  Social History Narrative   Not on file   Social Drivers of Health   Financial Resource Strain: Not on file  Food Insecurity: No Food Insecurity (02/24/2023)   Hunger Vital Sign    Worried About Running Out of Food in the Last Year: Never true    Ran Out of Food in the Last Year: Never true  Transportation Needs: No Transportation Needs (02/24/2023)   PRAPARE - Administrator, Civil Service (Medical): No    Lack of Transportation (Non-Medical): No  Physical Activity: Not on file  Stress: Not on file  Social Connections: Not on file  Intimate Partner Violence: Not At Risk (02/27/2023)   Humiliation, Afraid, Rape, and Kick questionnaire    Fear of Current or Ex-Partner: No    Emotionally Abused: No    Physically Abused: No    Sexually Abused: No     Review of Systems    General:  No chills, fever, night sweats or weight changes.  Cardiovascular:  No chest pain, dyspnea on exertion, edema, orthopnea, palpitations, paroxysmal nocturnal dyspnea. Dermatological: No rash, lesions/masses Respiratory: No cough, dyspnea Urologic: No hematuria, dysuria Abdominal:   No nausea, vomiting, diarrhea, bright red blood per rectum, melena, or hematemesis Neurologic:  No visual changes, wkns, changes in mental status. All other systems reviewed and are otherwise negative except as noted above.  Physical Exam    VS:  There were no vitals taken for this visit. , BMI There is no height or weight on file to calculate BMI. GEN: Well  nourished, well developed, in no acute distress. HEENT: normal. Neck: Supple, no JVD, carotid bruits, or masses. Cardiac: RRR, no murmurs, rubs, or gallops. No clubbing, cyanosis, bilateral l***ower extremity edema (  lymphedema).  Radials/DP/PT 2+ and equal bilaterally.  Respiratory:  Respirations regular and unlabored, clear to auscultation bilaterally. GI: Soft, nontender, nondistended, BS + x 4. MS: no deformity or atrophy. Skin: warm and dry, no rash. Neuro:  Strength and sensation are intact. Psych: Normal affect.  Accessory Clinical Findings    Recent Labs: 03/02/2023: B Natriuretic Peptide 177.7 03/03/2023: TSH 4.876 03/06/2023: Magnesium  2.1 03/10/2023: Hemoglobin 11.8; Platelets 202 03/11/2023: ALT 48 07/05/2023: BUN 15; Creatinine, Ser 0.78; Potassium 4.6; Sodium 140   Recent Lipid Panel    Component Value Date/Time   TRIG 96 02/26/2023 0405    No BP recorded.  {Refresh Note OR Click here to enter BP  :1}***    ECG personally reviewed by me today-   none today. Echocardiogram 02/25/2023  . Poor quality echo images despite Definity  administration. Challenging  study in the setting of tachycardia. Left ventricular ejection fraction is  grossly normal. There is severe asymmetric left ventricular hypertrophy of  the septal segment.  Indeterminate diastolic filling due to E-A fusion. Consider Limited Echo  with contrast when tachycardia is resolved.   2. Right ventricular systolic function was not well visualized. The right  ventricular size is normal. Tricuspid regurgitation signal is inadequate  for assessing PA pressure.   3. The mitral valve was not well visualized. No evidence of mitral valve  regurgitation. No evidence of mitral stenosis.   4. The aortic valve was not well visualized. Aortic valve regurgitation  is not visualized. No aortic stenosis is present.   5. The inferior vena cava is dilated in size with <50% respiratory  variability, suggesting right atrial  pressure of 15 mmHg.   Comparison(s): No prior Echocardiogram.    Echocardiogram 07/25/2023  IMPRESSIONS     1. Left ventricular ejection fraction, by estimation, is 65 to 70%. The  left ventricle has normal function. The left ventricle has no regional  wall motion abnormalities. mild to moderate asymmetric left ventricular  hypertrophy of the basal-septal  segment.   2. Right ventricular systolic function was not well visualized. The right  ventricular size is not well visualized.   3. The inferior vena cava is dilated in size with <50% respiratory  variability, suggesting right atrial pressure of 15 mmHg.   4. Limited echo evaluate LV structure and function   FINDINGS   Left Ventricle: Left ventricular ejection fraction, by estimation, is 65  to 70%. The left ventricle has normal function. The left ventricle has no  regional wall motion abnormalities. Definity  contrast agent was given IV  to delineate the left ventricular   endocardial borders. Mild to moderate asymmetric left ventricular  hypertrophy of the basal-septal segment.   Right Ventricle: The right ventricular size is not well visualized. Right  vetricular wall thickness was not well visualized. Right ventricular  systolic function was not well visualized.   Venous: The inferior vena cava is dilated in size with less than 50%  respiratory variability, suggesting right atrial pressure of 15 mmHg.    Assessment & Plan   1.  CHF, asymmetric LVH-breathing stable.  Weight today***.  Echocardiogram 07/25/2023 showed an LVEF of 60 to 65%, mild-moderate asymmetric LVH.   Heart healthy low-sodium diet-reviewed Continue spironolactone , furosemide , potassium Increase physical activity as tolerated Daily weights-maintain weight log   Paroxysmal atrial fibrillation-heart rate today 6***4.  CHA2DS2-VASc score 4.  Reports compliance with apixaban .  Denies bleeding issues. Continue anticoagulation Avoid triggers caffeine,  chocolate, EtOH, dehydration etc.  Essential hypertension-BP today 114/***60. Maintain blood  pressure log Continued diltiazem , spironolactone  Heart healthy low-sodium diet-reviewed  Tachycardia-heart rate today***64 .  Notices increased heart rate with increased physical activity.  Denies recent episodes of palpitations or irregular heartbeat. Maintain p.o. hydration No plans for cardiac event monitor at this time  OSA-sleep study has been ordered per pt*** BiPAP compliance strongly encouraged Elevate head of bed Avoid supine sleeping Weight loss recommended  Lower extremity lymphedema-stable.  Reviewed benefits of lymphedema pumps. Follow-up with PCP   Disposition: Follow-up with Dr. Pietro or me in  6 months.   Josefa HERO. Carlos Quackenbush NP-C     02/21/2024, 11:12 AM [facility] [address] Office [phone] Fax [phone]    I spent 15*** minutes examining this patient, reviewing medications, and using patient centered shared decision making involving her cardiac care.   I spent greater than 20 minutes reviewing her past medical history,  medications, and prior cardiac tests.

## 2024-02-22 ENCOUNTER — Ambulatory Visit: Payer: Managed Care, Other (non HMO) | Attending: General Practice | Admitting: General Practice

## 2024-02-22 ENCOUNTER — Encounter: Payer: Self-pay | Admitting: General Practice

## 2024-02-22 VITALS — BP 128/70 | HR 68 | Ht 66.0 in | Wt 286.0 lb

## 2024-02-22 DIAGNOSIS — I10 Essential (primary) hypertension: Secondary | ICD-10-CM | POA: Diagnosis not present

## 2024-02-22 DIAGNOSIS — G4733 Obstructive sleep apnea (adult) (pediatric): Secondary | ICD-10-CM

## 2024-02-22 DIAGNOSIS — R000 Tachycardia, unspecified: Secondary | ICD-10-CM

## 2024-02-22 DIAGNOSIS — I480 Paroxysmal atrial fibrillation: Secondary | ICD-10-CM

## 2024-02-22 DIAGNOSIS — I5032 Chronic diastolic (congestive) heart failure: Secondary | ICD-10-CM

## 2024-02-22 DIAGNOSIS — R600 Localized edema: Secondary | ICD-10-CM

## 2024-02-22 NOTE — Patient Instructions (Addendum)
 Medication Instructions:   Your physician recommends that you continue on your current medications as directed. Please refer to the Current Medication list given to you today.   *If you need a refill on your cardiac medications before your next appointment, please call your pharmacy*  Lab Work: NONE ORDERED  TODAY   If you have labs (blood work) drawn today and your tests are completely normal, you will receive your results only by: MyChart Message (if you have MyChart) OR A paper copy in the mail If you have any lab test that is abnormal or we need to change your treatment, we will call you to review the results.  Testing/Procedures: NONE ORDERED  TODAY    Follow-Up: At [facility], you and your health needs are our priority.  As part of our continuing mission to provide you with exceptional heart care, our providers are all part of one team.  This team includes your primary Cardiologist (physician) and Advanced Practice Providers or APPs (Physician Assistants and Nurse Practitioners) who all work together to provide you with the care you need, when you need it.  Your next appointment:   6 month(s)  Provider:   Redell Shallow, MD [place]MA[place]   We recommend signing up for the patient portal called MyChart.  Sign up information is provided on this After Visit Summary.  MyChart is used to connect with patients for Virtual Visits (Telemedicine).  Patients are able to view lab/test results, encounter notes, upcoming appointments, etc.  Non-urgent messages can be sent to your provider as well.   To learn more about what you can do with MyChart, go to [email or web address].   Other Instructions

## 2024-03-11 ENCOUNTER — Ambulatory Visit: Admitting: Pulmonary Disease

## 2024-03-11 ENCOUNTER — Telehealth (INDEPENDENT_AMBULATORY_CARE_PROVIDER_SITE_OTHER): Admitting: Pulmonary Disease

## 2024-03-11 ENCOUNTER — Encounter: Payer: Self-pay | Admitting: *Deleted

## 2024-03-11 ENCOUNTER — Ambulatory Visit (INDEPENDENT_AMBULATORY_CARE_PROVIDER_SITE_OTHER): Admitting: *Deleted

## 2024-03-11 DIAGNOSIS — Z87891 Personal history of nicotine dependence: Secondary | ICD-10-CM

## 2024-03-11 DIAGNOSIS — J449 Chronic obstructive pulmonary disease, unspecified: Secondary | ICD-10-CM

## 2024-03-11 NOTE — Patient Instructions (Signed)
 Follow-up in 6 months  Inspiratory muscle training devices IMT/PEP device  Encourage graded activities as tolerated  Give us  a call with significant concerns  Continue current inhalers including Trelegy and albuterol 

## 2024-03-11 NOTE — Progress Notes (Signed)
 Virtual Visit via Video Note  I connected with Kimberly Love on 03/11/24 at  4:00 PM EDT by a video enabled telemedicine application and verified that I am speaking with the correct person using two identifiers.  Location: Patient: Patient was at home Provider: In the office, [address].   I discussed the limitations of evaluation and management by telemedicine and the availability of in person appointments. The patient expressed understanding and agreed to proceed.  History of Present Illness: Patient with obstructive sleep apnea, chronic respiratory failure, chronic obstructive pulmonary disease, history of heart failure  Has been doing relatively well Compliant with inhalers Uses oxygen around-the-clock  Does try to stay active  Smoke significantly in the past about 2 packs a day for over 40 years  Continues to feel relatively well    Observations/Objective: Stable state Looks well, sounds well  PFT with severe obstructive disease  Assessment and Plan: Severe obstructive disease Chronic obstructive pulmonary disease  Chronic respiratory failure  Follow Up Instructions: Continue oxygen supplementation May adjust oxygen flow as needed to keep saturations greater than 90%  Encouraged to continue graded activities as tolerated  Follow-up in about 6 months  Continue using Trelegy  Albuterol  as needed  We did talk about inspiratory muscle trainers, may help with shortness of breath   I discussed the assessment and treatment plan with the patient. The patient was provided an opportunity to ask questions and all were answered. The patient agreed with the plan and demonstrated an understanding of the instructions.   The patient was advised to call back or seek an in-person evaluation if the symptoms worsen or if the condition fails to improve as anticipated.  I provided 20 minutes of non-face-to-face time during this encounter.   Jennet DELENA Epley,  MD

## 2024-03-11 NOTE — Progress Notes (Signed)
  Virtual Visit via Video Note  I connected with Kimberly Love on 03/11/24 at  1:30 PM EDT by a video enabled telemedicine application and verified that I am speaking with the correct person using two identifiers.  Location: Patient: in home Provider: [address], [place], [place], [address]     Shared Decision Making Visit Lung Cancer Screening Program [phone])   Eligibility: Age 73 y.o. Pack Years Smoking History Calculation 84 (# packs/per year x # years smoked) Recent History of coughing up blood  no Unexplained weight loss? no ( >Than 15 pounds within the last 6 months ) Prior History Lung / other cancer no (Diagnosis within the last 5 years already requiring surveillance chest CT Scans). Smoking Status Former Smoker Former Smokers: Years since quit: 1 year  Quit Date: 02-24-23  Visit Components: Discussion included one or more decision making aids. yes Discussion included risk/benefits of screening. yes Discussion included potential follow up diagnostic testing for abnormal scans. yes Discussion included meaning and risk of over diagnosis. yes Discussion included meaning and risk of False Positives. yes Discussion included meaning of total radiation exposure. yes  Counseling Included: Importance of adherence to annual lung cancer LDCT screening. yes Impact of comorbidities on ability to participate in the program. yes Ability and willingness to under diagnostic treatment. yes  Smoking Cessation Counseling: Current Smokers:  Discussed importance of smoking cessation. yes Information about tobacco cessation classes and interventions provided to patient. yes Patient provided with ticket for LDCT Scan. yes Symptomatic Patient. no  Counseling na Diagnosis Code: Tobacco Use Z72.0 Asymptomatic Patient yes  Counseling (Intermediate counseling: > three minutes counseling) [id number] Former Smokers:  Discussed the importance of maintaining cigarette abstinence.  yes Diagnosis Code: Personal History of Nicotine  Dependence. S12.108 Information about tobacco cessation classes and interventions provided to patient. Yes Patient provided with ticket for LDCT Scan. yes Written Order for Lung Cancer Screening with LDCT placed in Epic. Yes (CT Chest Lung Cancer Screening Low Dose W/O CM) [id number] Z12.2-Screening of respiratory organs Z87.891-Personal history of nicotine  dependence   Josette Ranger, RN 03/11/24

## 2024-03-11 NOTE — Patient Instructions (Signed)

## 2024-03-12 ENCOUNTER — Encounter: Admitting: Acute Care

## 2024-03-13 ENCOUNTER — Ambulatory Visit (HOSPITAL_COMMUNITY)

## 2024-03-29 ENCOUNTER — Encounter (HOSPITAL_COMMUNITY): Payer: Self-pay

## 2024-03-29 ENCOUNTER — Ambulatory Visit (HOSPITAL_COMMUNITY): Admission: RE | Admit: 2024-03-29 | Source: Ambulatory Visit

## 2024-03-29 ENCOUNTER — Telehealth: Payer: Self-pay | Admitting: Cardiology

## 2024-03-29 NOTE — Telephone Encounter (Signed)
 Lab results from Dr. Shona scanned into media tab.

## 2024-03-30 ENCOUNTER — Other Ambulatory Visit: Payer: Self-pay | Admitting: Adult Health

## 2024-04-01 ENCOUNTER — Other Ambulatory Visit: Payer: Self-pay | Admitting: *Deleted

## 2024-04-01 MED ORDER — ALBUTEROL SULFATE HFA 108 (90 BASE) MCG/ACT IN AERS: 2.0000 | INHALATION_SPRAY | Freq: Four times a day (QID) | RESPIRATORY_TRACT | 2 refills | Status: DC | PRN

## 2024-04-09 ENCOUNTER — Encounter (INDEPENDENT_AMBULATORY_CARE_PROVIDER_SITE_OTHER): Payer: Self-pay | Admitting: *Deleted

## 2024-06-12 ENCOUNTER — Encounter (INDEPENDENT_AMBULATORY_CARE_PROVIDER_SITE_OTHER): Payer: Self-pay | Admitting: Gastroenterology

## 2024-06-29 ENCOUNTER — Other Ambulatory Visit: Payer: Self-pay | Admitting: Adult Health

## 2024-08-30 ENCOUNTER — Ambulatory Visit: Admitting: Cardiology

## 2024-09-03 NOTE — Progress Notes (Unsigned)
 "    HPI: FU CHF and PAF. Echo 11/24 showed normal LV function. Chest CT 11/24 showed emphysema, dilated pulmonary artery, CAD. Pt is NCB. Since last seen,    Current Outpatient Medications  Medication Sig Dispense Refill   acetaminophen  (TYLENOL ) 325 MG tablet Take 325 mg by mouth every 6 (six) hours as needed.     albuterol  (VENTOLIN  HFA) 108 (90 Base) MCG/ACT inhaler INHALE 2 PUFFS BY MOUTH EVERY 6 HOURS AS NEEDED FOR WHEEZE OR SHORTNESS OF BREATH 8.5 each 2   apixaban  (ELIQUIS ) 5 MG TABS tablet Take 1 tablet (5 mg total) by mouth 2 (two) times daily. 60 tablet 0   cholecalciferol (VITAMIN D3) 25 MCG (1000 UNIT) tablet Take 1,000 Units by mouth daily.     clotrimazole-betamethasone (LOTRISONE) cream Apply 1 Application topically 2 (two) times daily as needed.     diltiazem  (CARDIZEM  CD) 120 MG 24 hr capsule Take 1 capsule (120 mg total) by mouth daily. 90 capsule 3   escitalopram  (LEXAPRO ) 20 MG tablet Take 20 mg by mouth daily.     Fluticasone-Umeclidin-Vilant (TRELEGY ELLIPTA ) 100-62.5-25 MCG/ACT AEPB Inhale 1 puff into the lungs daily. 3 each 3   furosemide  (LASIX ) 40 MG tablet Take 1 tab daily and an extra pill if you are having leg swelling, difficulty breathing or weight gain. 3060 tablet 0   multivitamin (CENTRUM) chewable tablet Chew 1 tablet by mouth daily.     Potassium Chloride  ER 20 MEQ TBCR Take 1 tablet by mouth daily.     pravastatin  (PRAVACHOL ) 10 MG tablet Take 1 tablet (10 mg total) by mouth 2 (two) times daily. 90 tablet 3   spironolactone  (ALDACTONE ) 25 MG tablet TAKE 1/2 TABLET BY MOUTH EVERY DAY 45 tablet 2   No current facility-administered medications for this visit.     Past Medical History:  Diagnosis Date   Anxiety    Arthritis    OA- L knee    Chronic kidney disease    Complication of anesthesia    had seizure in 1973  while waking up, after childbirth - vaginal  birth    Depression    GERD (gastroesophageal reflux disease)    Hiatal hernia     Hypertension    dr christyne   [facility]  [place]   PONV (postoperative nausea and vomiting)    Seizures (HCC)    x1- 1973- post childbirth     Past Surgical History:  Procedure Laterality Date   ABDOMINAL HYSTERECTOMY     partial    BREAST SURGERY     bio  2012   neg   CATARACT EXTRACTION W/PHACO Left 01/31/2020   Procedure: CATARACT EXTRACTION PHACO AND INTRAOCULAR LENS PLACEMENT (IOC);  Surgeon: Harrie Agent, MD;  Location: [facility];  Service: Ophthalmology;  Laterality: Left;  CDE: 25.85   CATARACT EXTRACTION W/PHACO Right 02/14/2020   Procedure: CATARACT EXTRACTION PHACO AND INTRAOCULAR LENS PLACEMENT RIGHT EYE;  Surgeon: Harrie Agent, MD;  Location: [facility];  Service: Ophthalmology;  Laterality: Right;  CDE: 15.45   CHOLECYSTECTOMY     COLONOSCOPY N/A 05/31/2018   Procedure: COLONOSCOPY;  Surgeon: Golda Claudis PENNER, MD;  Location: [facility];  Service: Endoscopy;  Laterality: N/A;  830   COLONOSCOPY N/A 05/09/2019   Procedure: COLONOSCOPY;  Surgeon: Golda Claudis PENNER, MD;  Location: [facility];  Service: Endoscopy;  Laterality: N/A;  930   ESOPHAGOGASTRODUODENOSCOPY N/A 02/05/2016   Procedure: ESOPHAGOGASTRODUODENOSCOPY (EGD);  Surgeon: Claudis PENNER Golda, MD;  Location: [facility];  Service: Endoscopy;  Laterality: N/A;  12:25 - moved to 10:40 - Ann notified pt   orthorscopy     rt knee   POLYPECTOMY  05/31/2018   Procedure: POLYPECTOMY;  Surgeon: Golda Claudis PENNER, MD;  Location: [facility];  Service: Endoscopy;;  colon   POLYPECTOMY  05/09/2019   Procedure: POLYPECTOMY;  Surgeon: Golda Claudis PENNER, MD;  Location: [facility];  Service: Endoscopy;;   TONSILLECTOMY     TOTAL KNEE ARTHROPLASTY  12/26/2011   Procedure: TOTAL KNEE ARTHROPLASTY;  Surgeon: Garnette JONETTA Raman, MD;  Location: [facility];  Service: Orthopedics;  Laterality: Right;  right total knee arthroplasty   TOTAL KNEE ARTHROPLASTY  07/02/2012   left knee   TOTAL KNEE ARTHROPLASTY  07/02/2012   Procedure: TOTAL KNEE  ARTHROPLASTY;  Surgeon: Garnette JONETTA Raman, MD;  Location: [facility];  Service: Orthopedics;  Laterality: Left;  left total knee replacement   TUBAL LIGATION      Social History   Socioeconomic History   Marital status: Married    Spouse name: Not on file   Number of children: Not on file   Years of education: Not on file   Highest education level: Not on file  Occupational History   Not on file  Tobacco Use   Smoking status: Former    Current packs/day: 0.00    Average packs/day: 1.5 packs/day for 32.0 years (48.0 ttl pk-yrs)    Types: Cigarettes    Quit date: 02/24/2023    Years since quitting: 1.5   Smokeless tobacco: Never  Vaping Use   Vaping status: Never Used  Substance and Sexual Activity   Alcohol use: No    Comment: Rarely   Drug use: No   Sexual activity: Not on file  Other Topics Concern   Not on file  Social History Narrative   Not on file   Social Drivers of Health   Tobacco Use: Medium Risk (03/11/2024)   Patient History    Smoking Tobacco Use: Former    Smokeless Tobacco Use: Never    Passive Exposure: Not on [place]uary Strain: Not on file  Food Insecurity: No Food Insecurity (02/24/2023)   Hunger Vital Sign    Worried About Running Out of Food in the Last Year: Never true    Ran Out of Food in the Last Year: Never true  Transportation Needs: No Transportation Needs (02/24/2023)   PRAPARE - Administrator, Civil Service (Medical): No    Lack of Transportation (Non-Medical): No  Physical Activity: Not on file  Stress: Not on file  Social Connections: Not on file  Intimate Partner Violence: Not At Risk (02/27/2023)   Humiliation, Afraid, Rape, and Kick questionnaire    Fear of Current or Ex-Partner: No    Emotionally Abused: No    Physically Abused: No    Sexually Abused: No  Depression (PHQ2-9): Not on file  Alcohol Screen: Not on file  Housing: Low Risk (02/24/2023)   Housing    Last Housing Risk Score: 0  Utilities: Not At  Risk (02/24/2023)   [facility] Utilities    Threatened with loss of utilities: No  Health Literacy: Not on file    Family History  Problem Relation Age of Onset   Colon cancer Neg Hx     ROS: no fevers or chills, productive cough, hemoptysis, dysphasia, odynophagia, melena, hematochezia, dysuria, hematuria, rash, seizure activity, orthopnea, PND, pedal edema, claudication. Remaining systems are negative.  Physical Exam: Well-developed well-nourished in no acute distress.  Skin is warm and dry.  HEENT is normal.  Neck is supple.  Chest is clear to auscultation with normal expansion.  Cardiovascular exam is regular rate and rhythm.  Abdominal exam nontender or distended. No masses palpated. Extremities show no edema. neuro grossly intact  ECG- personally reviewed  A/P  1 Chronic diastolic CHF-Euvolemic on exam. Continue lasix  and spironolactone  at present dose.   2 PAF-remains in sinus. Continue cardizem  and apixaban .  3 coronary calcification-continue statin; she denies CP.  4 hyperlipidemia-  5 OSA  6 Hypertension-BP controlled; continue present meds.   7 lower ext lymphedema-continue diuretics at present dose.   Redell Shallow, MD    "

## 2024-09-10 ENCOUNTER — Ambulatory Visit: Payer: Self-pay | Admitting: Cardiology

## 2024-09-15 ENCOUNTER — Other Ambulatory Visit: Payer: Self-pay | Admitting: Adult Health

## 2024-09-18 ENCOUNTER — Other Ambulatory Visit: Payer: Self-pay | Admitting: Cardiology

## 2024-09-25 MED ORDER — SPIRONOLACTONE 25 MG PO TABS: 12.5000 mg | ORAL_TABLET | Freq: Every day | ORAL | 0 refills | Status: AC

## 2024-09-25 NOTE — Telephone Encounter (Signed)
 No Labs within 6 Mo   In accordance with refill protocols, please review and address the following requirements before this medication refill can be authorized:  Labs

## 2024-10-02 ENCOUNTER — Ambulatory Visit: Admitting: Pulmonary Disease

## 2024-10-17 ENCOUNTER — Ambulatory Visit: Payer: Self-pay | Admitting: Cardiology

## 2024-12-18 ENCOUNTER — Ambulatory Visit: Admitting: Pulmonary Disease
